# Patient Record
Sex: Male | Born: 1955
Health system: Southern US, Community
[De-identification: ages and names within clinical notes are randomized; demographics above are authoritative.]

## PROBLEM LIST (undated history)

## (undated) DIAGNOSIS — E119 Type 2 diabetes mellitus without complications: Secondary | ICD-10-CM

## (undated) DIAGNOSIS — K219 Gastro-esophageal reflux disease without esophagitis: Secondary | ICD-10-CM

## (undated) DIAGNOSIS — T4145XA Adverse effect of unspecified anesthetic, initial encounter: Secondary | ICD-10-CM

## (undated) DIAGNOSIS — M5126 Other intervertebral disc displacement, lumbar region: Secondary | ICD-10-CM

## (undated) DIAGNOSIS — I10 Essential (primary) hypertension: Secondary | ICD-10-CM

## (undated) DIAGNOSIS — Z87442 Personal history of urinary calculi: Secondary | ICD-10-CM

## (undated) DIAGNOSIS — R351 Nocturia: Secondary | ICD-10-CM

## (undated) DIAGNOSIS — T8859XA Other complications of anesthesia, initial encounter: Secondary | ICD-10-CM

## (undated) DIAGNOSIS — C801 Malignant (primary) neoplasm, unspecified: Secondary | ICD-10-CM

## (undated) DIAGNOSIS — N2889 Other specified disorders of kidney and ureter: Secondary | ICD-10-CM

## (undated) DIAGNOSIS — J310 Chronic rhinitis: Secondary | ICD-10-CM

## (undated) DIAGNOSIS — M1990 Unspecified osteoarthritis, unspecified site: Secondary | ICD-10-CM

## (undated) DIAGNOSIS — I8390 Asymptomatic varicose veins of unspecified lower extremity: Secondary | ICD-10-CM

## (undated) DIAGNOSIS — J302 Other seasonal allergic rhinitis: Secondary | ICD-10-CM

## (undated) DIAGNOSIS — K432 Incisional hernia without obstruction or gangrene: Secondary | ICD-10-CM

## (undated) DIAGNOSIS — Z87898 Personal history of other specified conditions: Secondary | ICD-10-CM

## (undated) DIAGNOSIS — K759 Inflammatory liver disease, unspecified: Secondary | ICD-10-CM

## (undated) DIAGNOSIS — Z8719 Personal history of other diseases of the digestive system: Secondary | ICD-10-CM

## (undated) HISTORY — PX: TONSILLECTOMY: SUR1361

## (undated) HISTORY — PX: VARICOSE VEIN SURGERY: SHX832

## (undated) HISTORY — PX: CYSTECTOMY: SUR359

---

## 2000-01-03 ENCOUNTER — Emergency Department (HOSPITAL_COMMUNITY): Admission: EM | Admit: 2000-01-03 | Discharge: 2000-01-03 | Payer: Self-pay | Admitting: Emergency Medicine

## 2000-01-03 ENCOUNTER — Encounter: Payer: Self-pay | Admitting: Emergency Medicine

## 2000-01-08 ENCOUNTER — Encounter: Payer: Self-pay | Admitting: Neurosurgery

## 2000-01-08 ENCOUNTER — Ambulatory Visit (HOSPITAL_COMMUNITY): Admission: RE | Admit: 2000-01-08 | Discharge: 2000-01-08 | Payer: Self-pay | Admitting: Neurosurgery

## 2004-08-09 ENCOUNTER — Ambulatory Visit (HOSPITAL_COMMUNITY): Admission: RE | Admit: 2004-08-09 | Discharge: 2004-08-09 | Payer: Self-pay | Admitting: *Deleted

## 2004-08-28 ENCOUNTER — Ambulatory Visit: Payer: Self-pay

## 2008-06-03 ENCOUNTER — Ambulatory Visit: Payer: Self-pay | Admitting: Gastroenterology

## 2008-06-16 ENCOUNTER — Telehealth: Payer: Self-pay | Admitting: Gastroenterology

## 2008-06-17 ENCOUNTER — Ambulatory Visit: Payer: Self-pay | Admitting: Gastroenterology

## 2010-11-01 ENCOUNTER — Other Ambulatory Visit: Payer: Self-pay | Admitting: Internal Medicine

## 2010-11-01 DIAGNOSIS — R1011 Right upper quadrant pain: Secondary | ICD-10-CM

## 2010-11-06 ENCOUNTER — Ambulatory Visit
Admission: RE | Admit: 2010-11-06 | Discharge: 2010-11-06 | Disposition: A | Discharge status: Home / Self Care | Payer: PRIVATE HEALTH INSURANCE | Source: Ambulatory Visit | Attending: Internal Medicine | Admitting: Internal Medicine

## 2010-11-06 DIAGNOSIS — R1011 Right upper quadrant pain: Secondary | ICD-10-CM

## 2011-03-18 HISTORY — PX: OTHER SURGICAL HISTORY: SHX169

## 2011-03-26 ENCOUNTER — Ambulatory Visit (HOSPITAL_BASED_OUTPATIENT_CLINIC_OR_DEPARTMENT_OTHER)
Admission: RE | Admit: 2011-03-26 | Discharge: 2011-03-26 | Disposition: A | Discharge status: Home / Self Care | Payer: PRIVATE HEALTH INSURANCE | Source: Ambulatory Visit | Attending: Specialist | Admitting: Specialist

## 2011-03-26 DIAGNOSIS — X58XXXA Exposure to other specified factors, initial encounter: Secondary | ICD-10-CM | POA: Insufficient documentation

## 2011-03-26 DIAGNOSIS — IMO0002 Reserved for concepts with insufficient information to code with codable children: Secondary | ICD-10-CM | POA: Insufficient documentation

## 2011-03-26 DIAGNOSIS — M2240 Chondromalacia patellae, unspecified knee: Secondary | ICD-10-CM | POA: Insufficient documentation

## 2011-03-26 DIAGNOSIS — S83289A Other tear of lateral meniscus, current injury, unspecified knee, initial encounter: Secondary | ICD-10-CM | POA: Insufficient documentation

## 2011-03-26 DIAGNOSIS — Z01810 Encounter for preprocedural cardiovascular examination: Secondary | ICD-10-CM | POA: Insufficient documentation

## 2011-03-26 DIAGNOSIS — Z01812 Encounter for preprocedural laboratory examination: Secondary | ICD-10-CM | POA: Insufficient documentation

## 2011-03-26 HISTORY — PX: KNEE ARTHROSCOPY W/ MENISCAL REPAIR: SHX1877

## 2011-03-26 LAB — POCT HEMOGLOBIN-HEMACUE: Hemoglobin: 16.1 g/dL (ref 13.0–17.0)

## 2011-04-01 NOTE — Op Note (Signed)
  [name], [name]               ACCOUNT NO.:  [id number]  MEDICAL RECORD NO.:  [id number]  LOCATION:                                 FACILITY:  PHYSICIAN:  Jene Every, M.D.         DATE OF BIRTH:  DATE OF PROCEDURE:  03/26/2011 DATE OF DISCHARGE:                              OPERATIVE REPORT   ASSISTANT:  None.  PREOPERATIVE DIAGNOSIS:  Medial meniscus tear of the left knee.  POSTOPERATIVE DIAGNOSES:  Medial meniscus tear of the left knee, lateral meniscus tear, grade 3 chondromalacia of medial femoral condyle.  PROCEDURE PERFORMED:  Left knee arthroscopy, partial medial and lateral meniscectomy.  Chondroplasty of patella and medial femoral condyle.  HISTORY:  55 year old with knee pain, locking, giving way.  MRI indicating meniscus tear, indicated for arthroscopic debridement, failing conservative treatment.  Risks and benefits discussed including bleeding, infection, no change in symptoms, worsening symptoms, need for repeat debridement, DVT, PE, anesthetic complications, etc.  TECHNIQUE:  The patient in supine position.  After induction of adequate general anesthesia, 2 g of Kefzol, left lower extremity was prepped and draped in usual sterile fashion.  A lateral parapatellar portal and superomedial parapatellar portal was fashioned with a #11 blade. Ingress cannula atraumatically placed.  Irrigant was utilized to insufflate the joint.  Under direct visualization, medial parapatellar portal was fashioned with a #11 blade after localization with 18 gauge needle sparing the medial meniscus.  Noted a posterior horn medial meniscal tear, grade 3 changes of femoral condyle, loose cartilaginous debris.  Collene Mares was introduced, utilized to perform a light chondroplasty of femoral condyle to a stable base.  Basket rongeur introduced and utilized to perform a partial medial meniscectomy of the posterior third of the meniscus, approximately one-third of the posterior horn was  removed.  The remnant was stable to probe palpation.  Intercondylar notch revealed ACL unremarkable.  Lateral compartment revealed tearing of lateral meniscus and grade 3 changes of tibial plateau, posterior horn, introduced a shaver and debrided it to a stable base, performed a light chondroplasty of the tibial plateau.  The remnant was stable to probe palpation.  Suprapatellar pouch revealed normal patellofemoral tracking and mild-to- moderate chondromalacia of patellofemoral joint.  Light chondroplasty performed here.  Gutters unremarkable.  Knee was copiously lavaged.  Re- examined all 3 compartments.  No further pathology amenable to arthroscopic intervention, therefore removed all instrumentation. Portals were closed with 4-0 nylon simple sutures.  0.25% Marcaine with epinephrine was infiltrated in the joint.  Wound was dressed sterilely, awoken without difficulty, and transported to the recovery room in satisfactory condition.  The patient tolerated the procedure well.  There were no complications.     Jene Every, M.D.     Cordelia Pen  D:  03/26/2011  T:  03/27/2011  Job:  [id number]  Electronically Signed by Jene Every M.D. on 04/01/2011 09:53:39 AM

## 2011-04-10 ENCOUNTER — Ambulatory Visit: Payer: PRIVATE HEALTH INSURANCE | Attending: Specialist

## 2011-04-10 DIAGNOSIS — M25569 Pain in unspecified knee: Secondary | ICD-10-CM | POA: Insufficient documentation

## 2011-04-10 DIAGNOSIS — IMO0001 Reserved for inherently not codable concepts without codable children: Secondary | ICD-10-CM | POA: Insufficient documentation

## 2011-04-10 DIAGNOSIS — M25669 Stiffness of unspecified knee, not elsewhere classified: Secondary | ICD-10-CM | POA: Insufficient documentation

## 2011-04-13 ENCOUNTER — Ambulatory Visit: Payer: PRIVATE HEALTH INSURANCE | Admitting: Physical Therapy

## 2011-04-17 ENCOUNTER — Ambulatory Visit: Payer: PRIVATE HEALTH INSURANCE

## 2011-04-20 ENCOUNTER — Ambulatory Visit: Payer: PRIVATE HEALTH INSURANCE | Attending: Specialist | Admitting: Physical Therapy

## 2011-04-20 DIAGNOSIS — M25669 Stiffness of unspecified knee, not elsewhere classified: Secondary | ICD-10-CM | POA: Insufficient documentation

## 2011-04-20 DIAGNOSIS — IMO0001 Reserved for inherently not codable concepts without codable children: Secondary | ICD-10-CM | POA: Insufficient documentation

## 2011-04-20 DIAGNOSIS — M25569 Pain in unspecified knee: Secondary | ICD-10-CM | POA: Insufficient documentation

## 2011-04-24 ENCOUNTER — Ambulatory Visit: Payer: PRIVATE HEALTH INSURANCE | Admitting: Physical Therapy

## 2011-04-27 ENCOUNTER — Ambulatory Visit: Payer: PRIVATE HEALTH INSURANCE | Admitting: Physical Therapy

## 2011-05-01 ENCOUNTER — Ambulatory Visit: Payer: PRIVATE HEALTH INSURANCE | Admitting: Physical Therapy

## 2011-05-04 ENCOUNTER — Ambulatory Visit: Payer: PRIVATE HEALTH INSURANCE

## 2011-05-08 ENCOUNTER — Ambulatory Visit: Payer: PRIVATE HEALTH INSURANCE | Admitting: Physical Therapy

## 2011-05-11 ENCOUNTER — Ambulatory Visit: Payer: PRIVATE HEALTH INSURANCE | Admitting: Physical Therapy

## 2011-05-15 ENCOUNTER — Ambulatory Visit: Payer: PRIVATE HEALTH INSURANCE | Admitting: Physical Therapy

## 2011-05-17 ENCOUNTER — Ambulatory Visit: Payer: PRIVATE HEALTH INSURANCE | Admitting: Physical Therapy

## 2011-10-19 HISTORY — PX: CERVICAL SPINE SURGERY: SHX589

## 2011-10-29 ENCOUNTER — Other Ambulatory Visit: Payer: Self-pay | Admitting: Otolaryngology

## 2012-02-04 ENCOUNTER — Encounter: Payer: Self-pay | Admitting: Physician Assistant

## 2012-02-04 ENCOUNTER — Ambulatory Visit (INDEPENDENT_AMBULATORY_CARE_PROVIDER_SITE_OTHER): Payer: PRIVATE HEALTH INSURANCE | Admitting: Physician Assistant

## 2012-02-04 VITALS — BP 130/85 | HR 80 | Temp 97.1°F | Resp 16 | Ht 70.5 in | Wt 247.2 lb

## 2012-02-04 DIAGNOSIS — I10 Essential (primary) hypertension: Secondary | ICD-10-CM

## 2012-02-04 LAB — BASIC METABOLIC PANEL
BUN: 12 mg/dL (ref 6–23)
CO2: 28 mEq/L (ref 19–32)
Chloride: 105 mEq/L (ref 96–112)
Glucose, Bld: 91 mg/dL (ref 70–99)
Potassium: 4.3 mEq/L (ref 3.5–5.3)
Sodium: 140 mEq/L (ref 135–145)

## 2012-02-04 MED ORDER — CHLORTHALIDONE 15 MG PO TABS: 15.0000 mg | ORAL_TABLET | Freq: Every day | ORAL | Status: DC

## 2012-02-04 NOTE — Progress Notes (Signed)
Patient ID: Michael Church MRN: [id number], DOB: 12-08-1955, 56 y.o. Date of Encounter: 02/04/2012, 2:31 PM  Primary Physician: No primary provider on file.  Chief Complaint: HTN  HPI: 56 y.o. year old male with history below presents for hypertension evaluation. Patient took his BP two weeks prior at a health fair in [place], [place]. It was elevated at that time, but patient is unsure what the reading was. Review of his paper chart reveals a history c/w prehypertension to hypertension readings. He has never been on an antihypertensive medication. Diet consists of mostly chicken and fish, with occasional pork and sauces. Occasional alcohol, but not daily. At most he will have a glass of wine or a shot, again not everyday. No tobacco use or illicit substances. He has recently began a workout routine at the gym, goal to lose about 20 pounds. He denies any family history of cardiac disease or hypertension. No CP, HA, visual changes, or focal deficits.   Past Medical History  Diagnosis Date  . Prehypertension      Home Meds: Prior to Admission medications   Not on File    Allergies:  Allergies  Allergen Reactions  . Penicillins Itching    History   Social History  . Marital Status: Married    Spouse Name: N/A    Number of Children: N/A  . Years of Education: N/A   Occupational History  . Not on file.   Social History Main Topics  . Smoking status: Never Smoker   . Smokeless tobacco: Never Used  . Alcohol Use: 0.5 oz/week    1 drink(s) per week     OCCASIONALLY DRINK - BEER AND MIXED DRINKS  . Drug Use: No  . Sexually Active: Not on file   Other Topics Concern  . Not on file   Social History Narrative  . No narrative on file     Family History  Problem Relation Age of Onset  . Stroke Father     age 64    Review of Systems: Constitutional: negative for chills, fever, night sweats, weight changes, or fatigue  HEENT: negative for vision changes, hearing loss,  congestion, rhinorrhea, ST, epistaxis, or sinus pressure Cardiovascular: negative for chest pain, palpitations, or DOE Respiratory: negative for hemoptysis, wheezing, shortness of breath, or cough Abdominal: negative for abdominal pain, nausea, vomiting, diarrhea, or constipation Dermatological: negative for rash Neurologic: negative for headache, dizziness, or syncope All other systems reviewed and are otherwise negative with the exception to those above and in the HPI.   Physical Exam: Blood pressure 130/85, pulse 80, temperature 97.1 F (36.2 C), temperature source Oral, resp. rate 16, height 5' 10.5" (1.791 m), weight 247 lb 3.2 oz (112.129 kg)., Body mass index is 34.97 kg/(m^2). General: Well developed, well nourished, in no acute distress. Head: Normocephalic, atraumatic, eyes without discharge, sclera non-icteric, nares are without discharge. Bilateral auditory canals clear, TM's are without perforation, pearly grey and translucent with reflective cone of light bilaterally. Oral cavity moist, posterior pharynx without exudate, erythema, peritonsillar abscess, or post nasal drip.  Neck: Supple. No thyromegaly. Full ROM. No lymphadenopathy. No carotid bruits. Lungs: Clear bilaterally to auscultation without wheezes, rales, or rhonchi. Breathing is unlabored. Heart: RRR with S1 S2. No murmurs, rubs, or gallops appreciated.  Msk:  Strength and tone normal for age. Extremities/Skin: Warm and dry. No clubbing or cyanosis. No edema. No rashes or suspicious lesions. Distal pulses 2+ and equal bilaterally. Neuro: Alert and oriented X 3. Moves all extremities  spontaneously. Gait is normal. CNII-XII grossly in tact. DTR 2+, cerebellar function intact. Rhomberg normal. Psych:  Responds to questions appropriately with a normal affect.   Labs:  BMP pending  EKG: NSR, read by Dr. Perrin Maltese  ASSESSMENT AND PLAN:  56 y.o. year old male with borderline hypertension -Start Chlorthalidone 15mg  1 po  daily #30 RF 1 -Await lab -Follow up 1 month -Discussed with Dr. Perrin Maltese -Schedule CPE  Signed, Eula Listen, PA-C 02/04/2012 2:31 PM

## 2012-02-05 ENCOUNTER — Telehealth: Payer: Self-pay

## 2012-02-05 NOTE — Telephone Encounter (Signed)
Spoke with pharmacist and changed Rx to HCTZ 12.5mg  #30 with 1 refill per Michael Church. The pharmacist stated she didn't have the strength of med Michael Church prescribed yesterday. Med changed.

## 2012-03-17 ENCOUNTER — Encounter: Payer: Self-pay | Admitting: Physician Assistant

## 2012-03-17 ENCOUNTER — Ambulatory Visit (INDEPENDENT_AMBULATORY_CARE_PROVIDER_SITE_OTHER): Payer: PRIVATE HEALTH INSURANCE | Admitting: Physician Assistant

## 2012-03-17 VITALS — BP 124/81 | HR 80 | Temp 97.1°F | Resp 16 | Ht 70.5 in | Wt 244.0 lb

## 2012-03-17 DIAGNOSIS — I10 Essential (primary) hypertension: Secondary | ICD-10-CM

## 2012-03-17 MED ORDER — HYDROCHLOROTHIAZIDE 12.5 MG PO CAPS: 12.5000 mg | ORAL_CAPSULE | ORAL | Status: DC

## 2012-03-17 NOTE — Progress Notes (Signed)
Patient ID: Michael Church MRN: [id number], DOB: Sep 21, 1955, 56 y.o. Date of Encounter: 03/17/2012, 2:32 PM  Primary Physician: No primary provider on file.  Chief Complaint: HTN  HPI: 56 y.o. year old male with history below presents for hypertension follow up. Doing well. No issues or complaints. Tolerating HCTZ 12.5 mg. Eating healthier. Has been working out regularly. No CP, HA, visual changes, or focal deficits. Trying to lose some weight.   Past Medical History  Diagnosis Date  . Prehypertension      Home Meds: Prior to Admission medications   Medication Sig Start Date End Date Taking? Authorizing Provider  hydrochlorothiazide (HYDRODIURIL) 12.5 MG tablet Take 12.5 mg by mouth daily.   Yes Historical Provider, MD    Allergies:  Allergies  Allergen Reactions  . Other Other (See Comments)    Foods - cantalopes, tomatoes, and  raw carrots  . Penicillins Itching    History   Social History  . Marital Status: Married    Spouse Name: N/A    Number of Children: N/A  . Years of Education: N/A   Occupational History  . Not on file.   Social History Main Topics  . Smoking status: Current Some Day Smoker -- 1 years    Types: Cigars  . Smokeless tobacco: Never Used  . Alcohol Use: 0.5 oz/week    1 drink(s) per week     OCCASIONALLY DRINK - BEER AND MIXED DRINKS  . Drug Use: No  . Sexually Active: Not on file   Other Topics Concern  . Not on file   Social History Narrative  . No narrative on file     Family History  Problem Relation Age of Onset  . Stroke Father     age 42    Review of Systems: Constitutional: negative for chills, fever, night sweats, weight changes, or fatigue  HEENT: negative for vision changes, hearing loss, congestion, rhinorrhea, ST, epistaxis, or sinus pressure Cardiovascular: negative for chest pain, palpitations, or DOE Respiratory: negative for hemoptysis, wheezing, shortness of breath, or cough Abdominal: negative for abdominal  pain, nausea, vomiting, diarrhea, or constipation Dermatological: negative for rash Neurologic: negative for headache, dizziness, or syncope All other systems reviewed and are otherwise negative with the exception to those above and in the HPI.   Physical Exam: Blood pressure 124/81, pulse 80, temperature 97.1 F (36.2 C), temperature source Oral, resp. rate 16, height 5' 10.5" (1.791 m), weight 244 lb (110.678 kg)., Body mass index is 34.52 kg/(m^2). General: Well developed, well nourished, in no acute distress. Head: Normocephalic, atraumatic, eyes without discharge, sclera non-icteric, nares are without discharge. Bilateral auditory canals clear, TM's are without perforation, pearly grey and translucent with reflective cone of light bilaterally. Oral cavity moist, posterior pharynx without exudate, erythema, peritonsillar abscess, or post nasal drip.  Neck: Supple. No thyromegaly. Full ROM. No lymphadenopathy. No carotid bruits. Lungs: Clear bilaterally to auscultation without wheezes, rales, or rhonchi. Breathing is unlabored. Heart: RRR with S1 S2. No murmurs, rubs, or gallops appreciated.  Msk:  Strength and tone normal for age. Extremities/Skin: Warm and dry. No clubbing or cyanosis. No edema. No rashes or suspicious lesions. Distal pulses 2+ and equal bilaterally. Neuro: Alert and oriented X 3. Moves all extremities spontaneously. Gait is normal. CNII-XII grossly in tact. DTR 2+, cerebellar function intact. Rhomberg normal. Psych:  Responds to questions appropriately with a normal affect.   Labs:  BMP pending  ASSESSMENT AND PLAN:  56 y.o. year old male with  well controlled HTN. -Continue HCTZ 12.5 mg #90 1 po daily RF 1 -Work on weight loss, goal of 230 in 3 months -Stop smoking tobacco -Continue regular exercise -Follow up 6 months  Signed, Eula Listen, PA-C 03/17/2012 2:32 PM

## 2012-03-18 LAB — BASIC METABOLIC PANEL
BUN: 13 mg/dL (ref 6–23)
CO2: 27 mEq/L (ref 19–32)
Calcium: 9.7 mg/dL (ref 8.4–10.5)
Glucose, Bld: 124 mg/dL — ABNORMAL HIGH (ref 70–99)

## 2012-04-05 ENCOUNTER — Ambulatory Visit (INDEPENDENT_AMBULATORY_CARE_PROVIDER_SITE_OTHER): Payer: PRIVATE HEALTH INSURANCE | Admitting: Family Medicine

## 2012-04-05 VITALS — BP 124/82 | HR 67 | Temp 98.3°F | Resp 18 | Ht 72.0 in | Wt 250.6 lb

## 2012-04-05 DIAGNOSIS — J029 Acute pharyngitis, unspecified: Secondary | ICD-10-CM

## 2012-04-05 LAB — POCT RAPID STREP A (OFFICE): Rapid Strep A Screen: NEGATIVE

## 2012-04-05 MED ORDER — PREDNISONE 20 MG PO TABS: ORAL_TABLET | ORAL | Status: DC

## 2012-04-05 NOTE — Patient Instructions (Addendum)
Drink plenty of fluids.  Avoid straining voice.   Pharyngitis, Viral and Bacterial Pharyngitis is soreness (inflammation) or infection of the pharynx. It is also called a sore throat. CAUSES  Most sore throats are caused by viruses and are part of a cold. However, some sore throats are caused by strep and other bacteria. Sore throats can also be caused by post nasal drip from draining sinuses, allergies and sometimes from sleeping with an open mouth. Infectious sore throats can be spread from person to person by coughing, sneezing and sharing cups or eating utensils. TREATMENT  Sore throats that are viral usually last 3-4 days. Viral illness will get better without medications (antibiotics). Strep throat and other bacterial infections will usually begin to get better about 24-48 hours after you begin to take antibiotics. HOME CARE INSTRUCTIONS   If the caregiver feels there is a bacterial infection or if there is a positive strep test, they will prescribe an antibiotic. The full course of antibiotics must be taken. If the full course of antibiotic is not taken, you or your child may become ill again. If you or your child has strep throat and do not finish all of the medication, serious heart or kidney diseases may develop.   Drink enough water and fluids to keep your urine clear or pale yellow.   Only take over-the-counter or prescription medicines for pain, discomfort or fever as directed by your caregiver.   Get lots of rest.   Gargle with salt water ( tsp. of salt in a glass of water) as often as every 1-2 hours as you need for comfort.   Hard candies may soothe the throat if individual is not at risk for choking. Throat sprays or lozenges may also be used.  SEEK MEDICAL CARE IF:   Large, tender lumps in the neck develop.   A rash develops.   Green, yellow-brown or bloody sputum is coughed up.   Your baby is older than 3 months with a rectal temperature of 100.5 F (38.1 C) or  higher for more than 1 day.  SEEK IMMEDIATE MEDICAL CARE IF:   A stiff neck develops.   You or your child are drooling or unable to swallow liquids.   You or your child are vomiting, unable to keep medications or liquids down.   You or your child has severe pain, unrelieved with recommended medications.   You or your child are having difficulty breathing (not due to stuffy nose).   You or your child are unable to fully open your mouth.   You or your child develop redness, swelling, or severe pain anywhere on the neck.   You have a fever.   Your baby is older than 3 months with a rectal temperature of 102 F (38.9 C) or higher.   Your baby is 28 months old or younger with a rectal temperature of 100.4 F (38 C) or higher.  MAKE SURE YOU:   Understand these instructions.   Will watch your condition.   Will get help right away if you are not doing well or get worse.  Document Released: 09/03/2005 Document Revised: 08/23/2011 Document Reviewed: 12/01/2007 [facility] Patient Information 2012 [place], [place].

## 2012-04-05 NOTE — Progress Notes (Signed)
Subjective: Patient has had about 3 days of sore throat. He awoke one morning with it this way. His voice is raspy. He has to use his voice at work. He does not smoke. He's not had a fever. No significant runny nose or cough. Generally he is pretty healthy. Recently started on blood pressure medicine.  Objective: TMs normal. Throat only minimally erythematous strep screen was taken neck supple without significant nodes. His voice is raspy. Heart regular without murmurs. Lungs clear.  Assessment:  Pharyngitis, probably viral  Plan:  Strep screen  Results for orders placed in visit on 04/05/12  POCT RAPID STREP A (OFFICE)      Component Value Range   Rapid Strep A Screen Negative  Negative   Prob viral, poss allergic pharyngitis  Will rx prednisone 60 mg qd for 3 days.

## 2012-09-22 ENCOUNTER — Other Ambulatory Visit: Payer: Self-pay | Admitting: *Deleted

## 2012-09-22 ENCOUNTER — Ambulatory Visit (INDEPENDENT_AMBULATORY_CARE_PROVIDER_SITE_OTHER): Payer: PRIVATE HEALTH INSURANCE | Admitting: Emergency Medicine

## 2012-09-22 ENCOUNTER — Encounter: Payer: Self-pay | Admitting: Physician Assistant

## 2012-09-22 VITALS — BP 136/80 | HR 87 | Temp 99.2°F | Resp 16 | Ht 71.0 in | Wt 246.8 lb

## 2012-09-22 DIAGNOSIS — R739 Hyperglycemia, unspecified: Secondary | ICD-10-CM

## 2012-09-22 DIAGNOSIS — I10 Essential (primary) hypertension: Secondary | ICD-10-CM

## 2012-09-22 DIAGNOSIS — E119 Type 2 diabetes mellitus without complications: Secondary | ICD-10-CM

## 2012-09-22 DIAGNOSIS — I8390 Asymptomatic varicose veins of unspecified lower extremity: Secondary | ICD-10-CM

## 2012-09-22 DIAGNOSIS — R7309 Other abnormal glucose: Secondary | ICD-10-CM

## 2012-09-22 LAB — BASIC METABOLIC PANEL
BUN: 16 mg/dL (ref 6–23)
Creat: 1.24 mg/dL (ref 0.50–1.35)
Glucose, Bld: 96 mg/dL (ref 70–99)
Potassium: 4.3 mEq/L (ref 3.5–5.3)

## 2012-09-22 MED ORDER — HYDROCHLOROTHIAZIDE 12.5 MG PO CAPS: 12.5000 mg | ORAL_CAPSULE | ORAL | Status: DC

## 2012-09-22 NOTE — Progress Notes (Signed)
  Subjective:    Patient ID: Michael Church, male    DOB: April 07, 1956, 57 y.o.   MRN: [id number]  HPI patient enters for followup of his high blood pressure. He denies chest pain or shortness of breath. He overall is feeling well. He is having trouble with his left knee and undergoes injections in his knee. He also has a varicose vein on the medial portion of the left knee. On his last visit here his sugar was borderline elevated    Review of Systems     Objective:   Physical Exam HEENT exam is unremarkable. His neck is supple. His chest is clear to auscultation and percussion. His cardiac exam is regular rate without murmurs rubs or gallops.    Results for orders placed in visit on 09/22/12  POCT GLYCOSYLATED HEMOGLOBIN (HGB A1C)      Component Value Range   Hemoglobin A1C 6.4        Assessment & Plan:  I repeated his blood pressure and it was 120/76. I told him he was now diabetic and needed to start being serious about weight loss and exercise. He is going to watch his free sugar intake. He's going to return to clinic in 4 months fasting. His blood pressure medicine was refilled and a  Bmet was checked. Also made a referral to the vein specialist for evaluation of varicose vein of his left knee

## 2012-09-23 ENCOUNTER — Emergency Department (HOSPITAL_COMMUNITY)
Admission: EM | Admit: 2012-09-23 | Discharge: 2012-09-23 | Disposition: A | Discharge status: Home / Self Care | Payer: PRIVATE HEALTH INSURANCE | Attending: Emergency Medicine | Admitting: Emergency Medicine

## 2012-09-23 ENCOUNTER — Encounter (HOSPITAL_COMMUNITY): Payer: Self-pay | Admitting: *Deleted

## 2012-09-23 ENCOUNTER — Emergency Department (HOSPITAL_COMMUNITY): Payer: PRIVATE HEALTH INSURANCE

## 2012-09-23 DIAGNOSIS — S0990XA Unspecified injury of head, initial encounter: Secondary | ICD-10-CM

## 2012-09-23 DIAGNOSIS — Z79899 Other long term (current) drug therapy: Secondary | ICD-10-CM | POA: Insufficient documentation

## 2012-09-23 DIAGNOSIS — I10 Essential (primary) hypertension: Secondary | ICD-10-CM | POA: Insufficient documentation

## 2012-09-23 DIAGNOSIS — R229 Localized swelling, mass and lump, unspecified: Secondary | ICD-10-CM | POA: Insufficient documentation

## 2012-09-23 DIAGNOSIS — S0083XA Contusion of other part of head, initial encounter: Secondary | ICD-10-CM

## 2012-09-23 DIAGNOSIS — H11039 Double pterygium of unspecified eye: Secondary | ICD-10-CM | POA: Insufficient documentation

## 2012-09-23 DIAGNOSIS — F17200 Nicotine dependence, unspecified, uncomplicated: Secondary | ICD-10-CM | POA: Insufficient documentation

## 2012-09-23 DIAGNOSIS — S0003XA Contusion of scalp, initial encounter: Secondary | ICD-10-CM | POA: Insufficient documentation

## 2012-09-23 HISTORY — DX: Essential (primary) hypertension: I10

## 2012-09-23 LAB — BASIC METABOLIC PANEL
CO2: 28 mEq/L (ref 19–32)
Calcium: 10.4 mg/dL (ref 8.4–10.5)
Creatinine, Ser: 1.06 mg/dL (ref 0.50–1.35)
GFR calc Af Amer: >90 mL/min (ref >90)

## 2012-09-23 LAB — CBC WITH DIFFERENTIAL/PLATELET
Basophils Absolute: 0 10*3/uL (ref 0.0–0.1)
Basophils Relative: 0 % (ref 0–1)
Eosinophils Relative: 0 % (ref 0–5)
HCT: 48.3 % (ref 39.0–52.0)
MCHC: 35.4 g/dL (ref 30.0–36.0)
MCV: 82.7 fL (ref 78.0–100.0)
Monocytes Absolute: 0.3 10*3/uL (ref 0.1–1.0)
RDW: 12.5 % (ref 11.5–15.5)

## 2012-09-23 MED ORDER — IBUPROFEN 200 MG PO TABS
600.0000 mg | ORAL_TABLET | Freq: Once | ORAL | Status: AC
  Administered 2012-09-23: 600 mg via ORAL
  Filled 2012-09-23: qty 3

## 2012-09-23 MED ORDER — LORAZEPAM 1 MG PO TABS
1.0000 mg | ORAL_TABLET | Freq: Once | ORAL | Status: AC
  Administered 2012-09-23: 1 mg via ORAL
  Filled 2012-09-23: qty 1

## 2012-09-23 MED ORDER — OXYCODONE-ACETAMINOPHEN 5-325 MG PO TABS
2.0000 | ORAL_TABLET | Freq: Once | ORAL | Status: AC
  Administered 2012-09-23: 2 via ORAL
  Filled 2012-09-23: qty 2
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
## 2012-09-23 NOTE — ED Provider Notes (Signed)
Medical screening examination/treatment/procedure(s) were performed by non-physician practitioner and as supervising physician I was immediately available for consultation/collaboration. Devoria Albe, MD, Armando Gang   Ward Givens, MD 09/23/12 [phone]
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
## 2012-09-23 NOTE — ED Notes (Signed)
CT called- should be initiated at approx 1600. Pt advised. Pt still reports some "hazy vision"

## 2012-09-25 ENCOUNTER — Telehealth: Payer: Self-pay

## 2012-09-25 NOTE — Telephone Encounter (Signed)
PATIENT STATES HE SAW DR. DAUB ON Monday FOR HIS HIGH BLOOD PRESSURE. HE REFILLED HIS BLOOD PRESSURE MEDICATION, HOWEVER, HE DID NOT FILL HIS PREDNISONE. HE NEEDS THAT CALLED INTO HIS PHARMACY PLEASE. BEST PHONE [phone] (CELL)   PHARMACY CHOICE IS [facility] ON [address].   MBC

## 2012-09-25 NOTE — Telephone Encounter (Signed)
His blood sugars have been elevated, what is he taking the prednisone for? Left message for him to call me back. He should not take the prednisone with his sugars elevated.

## 2012-09-28 NOTE — Telephone Encounter (Signed)
The prednisone was on pt avs and was wondering were it was.  Advised pt that this med was an old med back from July 2013 and the only med that was rx'd was the hctz.  Pt was a little confused.

## 2012-09-28 NOTE — Telephone Encounter (Signed)
lmom to cb. 

## 2012-11-11 ENCOUNTER — Ambulatory Visit (INDEPENDENT_AMBULATORY_CARE_PROVIDER_SITE_OTHER): Payer: PRIVATE HEALTH INSURANCE | Admitting: Family Medicine

## 2012-11-11 ENCOUNTER — Ambulatory Visit: Payer: PRIVATE HEALTH INSURANCE

## 2012-11-11 VITALS — BP 127/77 | HR 73 | Temp 97.9°F | Resp 18 | Ht 71.0 in | Wt 243.0 lb

## 2012-11-11 DIAGNOSIS — R110 Nausea: Secondary | ICD-10-CM

## 2012-11-11 DIAGNOSIS — R109 Unspecified abdominal pain: Secondary | ICD-10-CM

## 2012-11-11 DIAGNOSIS — E119 Type 2 diabetes mellitus without complications: Secondary | ICD-10-CM

## 2012-11-11 DIAGNOSIS — R197 Diarrhea, unspecified: Secondary | ICD-10-CM

## 2012-11-11 DIAGNOSIS — I10 Essential (primary) hypertension: Secondary | ICD-10-CM

## 2012-11-11 LAB — POCT CBC
Hemoglobin: 17.2 g/dL (ref 14.1–18.1)
MCH, POC: 29 pg (ref 27–31.2)
MPV: 8.4 fL (ref 0–99.8)
POC MID %: 6.9 %M (ref 0–12)
RBC: 5.93 M/uL (ref 4.69–6.13)
WBC: 5.5 10*3/uL (ref 4.6–10.2)

## 2012-11-11 LAB — COMPREHENSIVE METABOLIC PANEL
ALT: 30 U/L (ref 0–53)
Albumin: 4.4 g/dL (ref 3.5–5.2)
CO2: 32 mEq/L (ref 19–32)
Calcium: 10.3 mg/dL (ref 8.4–10.5)
Chloride: 98 mEq/L (ref 96–112)
Creat: 1.1 mg/dL (ref 0.50–1.35)
Potassium: 4.2 mEq/L (ref 3.5–5.3)
Total Protein: 7.1 g/dL (ref 6.0–8.3)

## 2012-11-11 LAB — LIPASE: Lipase: 18 U/L (ref 0–75)

## 2012-11-11 NOTE — Progress Notes (Signed)
Subjective:    Patient ID: Michael Church, male    DOB: [birth year], 57 y.o.   MRN: [id number]  HPI  On Friday he felt bloated with a little epigastric pian radiating down and then on Sat he developed diarrhea and nausea. On Sun, he felt like he was improving a little but still bloating - worse with laying flat, had to sleep sitting up. Yesterday cont to improve but wasn't eating much but last night a little nausea and diarrhea - couldn't eat anyting but soup and still had pressure over top of abdomen.  Diarrhea is loose, small amount, dark. Only vomited once and that was just the nacho chips and salsa he ate after work at 2 a.m. on Sat.  Has been trying some garcia cambia (from Dr. Neil Crouch - helps curb your appetite to loose weight) started it about 2 wks ago.  Grandson also had stomach virus this wkend.  Some chills but no fever.  Able to drink and urinating normally.  Pain is worse at night before bed.   Past Medical History  Diagnosis Date  . Prehypertension   . Hypertension    Current Outpatient Prescriptions on File Prior to Visit  Medication Sig Dispense Refill  . hydrochlorothiazide (MICROZIDE) 12.5 MG capsule Take 1 capsule (12.5 mg total) by mouth every morning.  90 capsule  3  . Multiple Vitamin (MULTIVITAMIN WITH MINERALS) TABS Take 1 tablet by mouth daily.       No current facility-administered medications on file prior to visit.   Allergies  Allergen Reactions  . Other Other (See Comments)    Foods - cantalopes, tomatoes, and  raw carrots  . Penicillins Itching     Review of Systems  Constitutional: Positive for chills, appetite change and fatigue. Negative for fever, diaphoresis and activity change.  Respiratory: Negative for shortness of breath.   Cardiovascular: Negative for chest pain.  Gastrointestinal: Positive for nausea, vomiting, abdominal pain, diarrhea and abdominal distention. Negative for constipation, blood in stool, anal bleeding and rectal pain.   Genitourinary: Negative for dysuria, frequency and decreased urine volume.  Hematological: Negative for adenopathy.      BP 127/77  Pulse 73  Temp(Src) 97.9 F (36.6 C) (Oral)  Resp 18  Ht 5\' 11"  (1.803 m)  Wt 243 lb (110.224 kg)  BMI 33.91 kg/m2  SpO2 98% Objective:   Physical Exam  Constitutional: He is oriented to person, place, and time. He appears well-developed and well-nourished. No distress.  HENT:  Head: Normocephalic and atraumatic.  Right Ear: External ear normal.  Left Ear: External ear normal.  Eyes: Conjunctivae are normal. Right eye exhibits no discharge. Left eye exhibits no discharge.  Neck: Normal range of motion. Neck supple. No thyromegaly present.  Cardiovascular: Normal rate, regular rhythm, normal heart sounds and intact distal pulses.   Pulmonary/Chest: Effort normal and breath sounds normal. No respiratory distress.  Abdominal: Soft. Normal appearance. He exhibits no distension and no mass. Bowel sounds are decreased. There is no hepatosplenomegaly. There is tenderness in the right upper quadrant and epigastric area. There is no rigidity, no rebound, no guarding, no CVA tenderness, no tenderness at McBurney's point and negative Murphy's sign. No hernia.  Genitourinary: Rectum normal and prostate normal. Rectal exam shows no tenderness and anal tone normal. Guaiac negative stool.  Lymphadenopathy:    He has no cervical adenopathy.  Neurological: He is alert and oriented to person, place, and time.  Skin: He is not diaphoretic.  Results for orders placed in visit on 11/11/12  POCT CBC      Result Value Range   WBC 5.5  4.6 - 10.2 K/uL   Lymph, poc 1.9  0.6 - 3.4   POC LYMPH PERCENT 34.1  10 - 50 %L   MID (cbc) 0.4  0 - 0.9   POC MID % 6.9  0 - 12 %M   POC Granulocyte 3.2  2 - 6.9   Granulocyte percent 59.0  37 - 80 %G   RBC 5.93  4.69 - 6.13 M/uL   Hemoglobin 17.2  14.1 - 18.1 g/dL   HCT, POC 57.8  46.9 - 53.7 %   MCV 89.2  80 - 97 fL   MCH,  POC 29.0  27 - 31.2 pg   MCHC 32.5  31.8 - 35.4 g/dL   RDW, POC 62.9     Platelet Count, POC 296  142 - 424 K/uL   MPV 8.4  0 - 99.8 fL   [facility] reading (PRIMARY) by  Dr. Clelia Croft. Acute abd series: NO acute abnormality. Still some stool present  Assessment & Plan:  Abdominal pain, unspecified site - Plan: POCT CBC, DG Abd Acute W/Chest, Comprehensive metabolic panel, Lipase  Hypertension  Diabetes - well controlled on diet and exercise - last a1c 6.4, not on meds.  Nausea alone - Plan: POCT CBC, DG Abd Acute W/Chest, Comprehensive metabolic panel, Lipase  Diarrhea - Plan: POCT CBC, DG Abd Acute W/Chest, Comprehensive metabolic panel, Lipase  I suspect pt has a mild viral irritation - sxs mild so no treatment needed that this time - do not use any anti-diarrheal products - rec mild soft bland diet and gradually increase diet in fiber - see handouts. Stop garcinia-[place] supplement until sxs completely resolve. If sxs worsen- rec RTC  Meds ordered this encounter  Medications   none

## 2012-11-11 NOTE — Patient Instructions (Addendum)
Diet for Diarrhea, Adult Having frequent, runny stools (diarrhea) has many causes. Diarrhea may be caused or worsened by food or drink. Diarrhea may be relieved by changing your diet. IF YOU ARE NOT TOLERATING SOLID FOODS:  Drink enough water and fluids to keep your urine clear or pale yellow.  Avoid sugary drinks and sodas as well as milk-based beverages.  Avoid beverages containing caffeine and alcohol.  You may try rehydrating beverages. You can make your own by following this recipe:   tsp table salt.   tsp baking soda.   tsp salt substitute (potassium chloride).  1 tbs + 1 tsp sugar.  1 qt water. As your stools become more solid, you can start eating solid foods. Add foods one at a time. If a certain food causes your diarrhea to get worse, avoid that food and try other foods. A low fiber, low-fat, and lactose-free diet is recommended. Small, frequent meals may be better tolerated.  Starches  Allowed:  White, French, and pita breads, plain rolls, buns, bagels. Plain muffins, matzo. Soda, saltine, or graham crackers. Pretzels, melba toast, zwieback. Cooked cereals made with water: cornmeal, farina, cream cereals. Dry cereals: refined corn, wheat, rice. Potatoes prepared any way without skins, refined macaroni, spaghetti, noodles, refined rice.  Avoid:  Bread, rolls, or crackers made with whole wheat, multi-grains, rye, bran seeds, nuts, or coconut. Corn tortillas or taco shells. Cereals containing whole grains, multi-grains, bran, coconut, nuts, or raisins. Cooked or dry oatmeal. Coarse wheat cereals, granola. Cereals advertised as "high-fiber." Potato skins. Whole grain pasta, wild or brown rice. Popcorn. Sweet potatoes/yams. Sweet rolls, doughnuts, waffles, pancakes, sweet breads. Vegetables  Allowed: Strained tomato and vegetable juices. Most well-cooked and canned vegetables without seeds. Fresh: Tender lettuce, cucumber without the skin, cabbage, spinach, bean  sprouts.  Avoid: Fresh, cooked, or canned: Artichokes, baked beans, beet greens, broccoli, Brussels sprouts, corn, kale, legumes, peas, sweet potatoes. Cooked: Green or red cabbage, spinach. Avoid large servings of any vegetables, because vegetables shrink when cooked, and they contain more fiber per serving than fresh vegetables. Fruit  Allowed: All fruit juices except prune juice. Cooked or canned: Apricots, applesauce, cantaloupe, cherries, fruit cocktail, grapefruit, grapes, kiwi, mandarin oranges, peaches, pears, plums, watermelon. Fresh: Apples without skin, ripe banana, grapes, cantaloupe, cherries, grapefruit, peaches, oranges, plums. Keep servings limited to  cup or 1 piece.  Avoid: Fresh: Apple with skin, apricots, mango, pears, raspberries, strawberries. Prune juice, stewed or dried prunes. Dried fruits, raisins, dates. Large servings of all fresh fruits. Meat and Meat Substitutes  Allowed: Ground or well-cooked tender beef, ham, veal, lamb, pork, or poultry. Eggs, plain cheese. Fish, oysters, shrimp, lobster, other seafoods. Liver, organ meats.  Avoid: Tough, fibrous meats with gristle. Peanut butter, smooth or chunky. Cheese, nuts, seeds, legumes, dried peas, beans, lentils. Milk  Allowed: Yogurt, lactose-free milk, kefir, drinkable yogurt, buttermilk, soy milk.  Avoid: Milk, chocolate milk, beverages made with milk, such as milk shakes. Soups  Allowed: Bouillon, broth, or soups made from allowed foods. Any strained soup.  Avoid: Soups made from vegetables that are not allowed, cream or milk-based soups. Desserts and Sweets  Allowed: Sugar-free gelatin, sugar-free frozen ice pops made without sugar alcohol.  Avoid: Plain cakes and cookies, pie made with allowed fruit, pudding, custard, cream pie. Gelatin, fruit, ice, sherbet, frozen ice pops. Ice cream, ice milk without nuts. Plain hard candy, honey, jelly, molasses, syrup, sugar, chocolate syrup, gumdrops,  marshmallows. Fats and Oils  Allowed: Avoid any fats and oils.  Avoid:   Seeds, nuts, olives, avocados. Margarine, butter, cream, mayonnaise, salad oils, plain salad dressings made from allowed foods. Plain gravy, crisp bacon without rind. Beverages  Allowed: Water, decaffeinated teas, oral rehydration solutions, sugar-free beverages.  Avoid: Fruit juices, caffeinated beverages (coffee, tea, soda or pop), alcohol, sports drinks, or lemon-lime soda or pop. Condiments  Allowed: Ketchup, mustard, horseradish, vinegar, cream sauce, cheese sauce, cocoa powder. Spices in moderation: allspice, basil, bay leaves, celery powder or leaves, cinnamon, cumin powder, curry powder, ginger, mace, marjoram, onion or garlic powder, oregano, paprika, parsley flakes, ground pepper, rosemary, sage, savory, tarragon, thyme, turmeric.  Avoid: Coconut, honey. Weight Monitoring: Weigh yourself every day. You should weigh yourself in the morning after you urinate and before you eat breakfast. Wear the same amount of clothing when you weigh yourself. Record your weight daily. Bring your recorded weights to your clinic visits. Tell your caregiver right away if you have gained 3 lb/1.4 kg or more in 1 day, 5 lb/2.3 kg in a week, or whatever amount you were told to report. SEEK IMMEDIATE MEDICAL CARE IF:   You are unable to keep fluids down.  You start to throw up (vomit) or diarrhea keeps coming back (persistent).  Abdominal pain develops, increases, or can be felt in one place (localizes).  You have an oral temperature above 102 F (38.9 C), not controlled by medicine.  Diarrhea contains blood or mucus.  You develop excessive weakness, dizziness, fainting, or extreme thirst. MAKE SURE YOU:   Understand these instructions.  Will watch your condition.  Will get help right away if you are not doing well or get worse. Document Released: 11/24/2003 Document Revised: 11/26/2011 Document Reviewed:  01/18/2012 [employer] Patient Information 2013 [employer].  

## 2013-01-13 ENCOUNTER — Ambulatory Visit (INDEPENDENT_AMBULATORY_CARE_PROVIDER_SITE_OTHER): Payer: PRIVATE HEALTH INSURANCE | Admitting: Emergency Medicine

## 2013-01-13 ENCOUNTER — Ambulatory Visit: Payer: PRIVATE HEALTH INSURANCE

## 2013-01-13 VITALS — BP 120/77 | HR 82 | Temp 97.0°F | Resp 16 | Ht 71.0 in | Wt 238.0 lb

## 2013-01-13 DIAGNOSIS — E119 Type 2 diabetes mellitus without complications: Secondary | ICD-10-CM

## 2013-01-13 DIAGNOSIS — M79641 Pain in right hand: Secondary | ICD-10-CM

## 2013-01-13 DIAGNOSIS — M79609 Pain in unspecified limb: Secondary | ICD-10-CM
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
## 2013-01-26 ENCOUNTER — Encounter: Payer: PRIVATE HEALTH INSURANCE | Admitting: Physician Assistant

## 2013-07-14 ENCOUNTER — Encounter: Payer: Self-pay | Admitting: Emergency Medicine

## 2013-07-14 ENCOUNTER — Ambulatory Visit (INDEPENDENT_AMBULATORY_CARE_PROVIDER_SITE_OTHER): Payer: PRIVATE HEALTH INSURANCE | Admitting: Emergency Medicine

## 2013-07-14 VITALS — BP 132/86 | HR 76 | Temp 97.7°F | Resp 16 | Ht 71.0 in | Wt 234.0 lb

## 2013-07-14 DIAGNOSIS — Z125 Encounter for screening for malignant neoplasm of prostate: Secondary | ICD-10-CM

## 2013-07-14 DIAGNOSIS — Z23 Encounter for immunization: Secondary | ICD-10-CM

## 2013-07-14 DIAGNOSIS — E119 Type 2 diabetes mellitus without complications: Secondary | ICD-10-CM

## 2013-07-14 DIAGNOSIS — I10 Essential (primary) hypertension: Secondary | ICD-10-CM

## 2013-07-14 LAB — COMPREHENSIVE METABOLIC PANEL
Albumin: 4.2 g/dL (ref 3.5–5.2)
BUN: 13 mg/dL (ref 6–23)
CO2: 25 mEq/L (ref 19–32)
Calcium: 9.3 mg/dL (ref 8.4–10.5)
Chloride: 102 mEq/L (ref 96–112)
Glucose, Bld: 100 mg/dL — ABNORMAL HIGH (ref 70–99)
Potassium: 4 mEq/L (ref 3.5–5.3)
Total Protein: 6.8 g/dL (ref 6.0–8.3)

## 2013-07-14 LAB — PSA: PSA: 1.29 ng/mL (ref <=4.00)

## 2013-07-14 LAB — GLUCOSE, POCT (MANUAL RESULT ENTRY): POC Glucose: 111 mg/dl — AB (ref 70–99)

## 2013-07-14 NOTE — Progress Notes (Signed)
  Subjective:    Patient ID: Michael Church, male    DOB: July 11, 1956, 57 y.o.   MRN: [id number]  HPI patient here to followup hypertension and diabetes. He is seeing Dr. Guss Bunde is scheduled to have surgery on his legs. He feels well is taking his medication regular. He exercises on a regular basis. Pressures at home have been good   Review of Systems     Objective:   Physical Exam patient is alert and cooperative in no distress. His chest is clear heart regular rate no murmurs abdomen soft nontender rectal exam reveals a normal prostate no nodules felt  Results for orders placed in visit on 07/14/13  GLUCOSE, POCT (MANUAL RESULT ENTRY)      Result Value Range   POC Glucose 111 (*) 70 - 99 mg/dl  IFOBT (OCCULT BLOOD)      Result Value Range   IFOBT Negative    POCT GLYCOSYLATED HEMOGLOBIN (HGB A1C)      Result Value Range   Hemoglobin A1C 5.7         Assessment & Plan:  No change in current recommendations. He was given a flu shot. He is a nondiabetic range he will continue his blood pressure medication exercise. PSA was done and pending

## 2013-08-09 ENCOUNTER — Encounter (HOSPITAL_COMMUNITY): Payer: Self-pay | Admitting: Emergency Medicine

## 2013-08-09 ENCOUNTER — Ambulatory Visit: Payer: PRIVATE HEALTH INSURANCE

## 2013-08-09 ENCOUNTER — Ambulatory Visit (INDEPENDENT_AMBULATORY_CARE_PROVIDER_SITE_OTHER): Payer: PRIVATE HEALTH INSURANCE | Admitting: Family Medicine

## 2013-08-09 VITALS — BP 132/84 | HR 74 | Temp 98.1°F | Resp 18 | Ht 71.0 in | Wt 239.8 lb

## 2013-08-09 DIAGNOSIS — Z87891 Personal history of nicotine dependence: Secondary | ICD-10-CM | POA: Insufficient documentation

## 2013-08-09 DIAGNOSIS — R142 Eructation: Secondary | ICD-10-CM

## 2013-08-09 DIAGNOSIS — R079 Chest pain, unspecified: Secondary | ICD-10-CM

## 2013-08-09 DIAGNOSIS — I10 Essential (primary) hypertension: Secondary | ICD-10-CM | POA: Insufficient documentation

## 2013-08-09 DIAGNOSIS — R141 Gas pain: Secondary | ICD-10-CM

## 2013-08-09 DIAGNOSIS — Z880 Allergy status to penicillin: Secondary | ICD-10-CM | POA: Insufficient documentation

## 2013-08-09 DIAGNOSIS — Z8679 Personal history of other diseases of the circulatory system: Secondary | ICD-10-CM

## 2013-08-09 DIAGNOSIS — K219 Gastro-esophageal reflux disease without esophagitis: Secondary | ICD-10-CM | POA: Insufficient documentation

## 2013-08-09 DIAGNOSIS — Z79899 Other long term (current) drug therapy: Secondary | ICD-10-CM | POA: Insufficient documentation

## 2013-08-09 MED ORDER — RANITIDINE HCL 150 MG PO TABS: 150.0000 mg | ORAL_TABLET | Freq: Two times a day (BID) | ORAL | Status: DC

## 2013-08-09 NOTE — Progress Notes (Addendum)
Subjective:    Patient ID: Michael Church, male    DOB: 1956/07/05, 57 y.o.   MRN: [id number] This chart was scribed for Michael Staggers, MD by [name], ED Scribe. This patient was seen in room 4 and the patient's care was started at 4:23 PM.  HPI [name] is a 57 y.o. male He reports right leg vein surgery on 07/18/2013 and again on the 08/03/2013. [name] performed lasic surgery to varicose veins. He states they did [place] with negative results for blood clot on 08/03/2013. He states he wears compression stockings on his right leg. He reports mild soreness over bandages on leg from surgery on 07/18/2013, but denies pain since the first surgery.   He states 4 days ago he started walking and felt some sudden, sharp, intermittent, lower, central chest pain and tightness that radiated to his left chest, around his back and also up to his throat. He reports the pain again on Thursday and Friday and earlier today. He denies current chest pain. He states that the pain usually lasts about 1 hour and subsides gradually after that. He denies being able to reproduce the pain by touch. He denies the pain radiating to his arms. He reports that deep breathing exacerbates the chest pain. He reports some minor belching and burping after feeling the need to belch each time he felt the chest pain. He states he has been able to exercise without chest pain. He reports being able to eat and drink normally. He reports drinking about 2-3 caffeinated drinks a day. He denies having products with mint recently. He denies SOB, nausea, abdominal pain, cough, fever, and any other associated symptoms. He denies h/o heart problems and h/o blood clots, and family history of the same. He denies h/o smoking. He reports his father died of stroke.    Denies hx of DM2 - had elevated A1c at 6.5 about a year and a half ago, but lower since and most recent Aic 5.7 with weight loss and avoiding soda.   Results for orders  placed in visit on 07/14/13  COMPREHENSIVE METABOLIC PANEL      Result Value Range   Sodium 138  135 - 145 mEq/L   Potassium 4.0  3.5 - 5.3 mEq/L   Chloride 102  96 - 112 mEq/L   CO2 25  19 - 32 mEq/L   Glucose, Bld 100 (*) 70 - 99 mg/dL   BUN 13  6 - 23 mg/dL   Creat 4.09  8.11 - 9.14 mg/dL   Total Bilirubin 0.6  0.3 - 1.2 mg/dL   Alkaline Phosphatase 35 (*) 39 - 117 U/L   AST 26  0 - 37 U/L   ALT 26  0 - 53 U/L   Total Protein 6.8  6.0 - 8.3 g/dL   Albumin 4.2  3.5 - 5.2 g/dL   Calcium 9.3  8.4 - 78.2 mg/dL  PSA      Result Value Range   PSA 1.29  <=4.00 ng/mL  GLUCOSE, POCT (MANUAL RESULT ENTRY)      Result Value Range   POC Glucose 111 (*) 70 - 99 mg/dl  IFOBT (OCCULT BLOOD)      Result Value Range   IFOBT Negative    POCT GLYCOSYLATED HEMOGLOBIN (HGB A1C)      Result Value Range   Hemoglobin A1C 5.7       PCP - GUEST, Loretha Stapler, MD  Patient Active Problem List   Diagnosis  Date Noted  . Hypertension 09/22/2012  . Diabetes 09/22/2012  . Varicose vein of leg 09/22/2012   Past Medical History  Diagnosis Date  . Prehypertension   . Hypertension    Past Surgical History  Procedure Laterality Date  . Left knee torn meniscus  03/2011  . Cervical spine surgery  10/2011   Allergies  Allergen Reactions  . Other Other (See Comments)    Foods - cantalopes, tomatoes, and  raw carrots  . Penicillins Itching   Prior to Admission medications   Medication Sig Start Date End Date Taking? Authorizing Provider  hydrochlorothiazide (MICROZIDE) 12.5 MG capsule Take 1 capsule (12.5 mg total) by mouth every morning. 09/22/12 09/22/13 Yes Collene Gobble, MD  Multiple Vitamin (MULTIVITAMIN WITH MINERALS) TABS Take 1 tablet by mouth daily.   Yes Historical Provider, MD  PENNSAID 1.5 % SOLN  09/12/12  Yes Historical Provider, MD   Results for orders placed in visit on 07/14/13  COMPREHENSIVE METABOLIC PANEL      Result Value Range   Sodium 138  135 - 145 mEq/L   Potassium 4.0   3.5 - 5.3 mEq/L   Chloride 102  96 - 112 mEq/L   CO2 25  19 - 32 mEq/L   Glucose, Bld 100 (*) 70 - 99 mg/dL   BUN 13  6 - 23 mg/dL   Creat 1.61  0.96 - 0.45 mg/dL   Total Bilirubin 0.6  0.3 - 1.2 mg/dL   Alkaline Phosphatase 35 (*) 39 - 117 U/L   AST 26  0 - 37 U/L   ALT 26  0 - 53 U/L   Total Protein 6.8  6.0 - 8.3 g/dL   Albumin 4.2  3.5 - 5.2 g/dL   Calcium 9.3  8.4 - 40.9 mg/dL  PSA      Result Value Range   PSA 1.29  <=4.00 ng/mL  GLUCOSE, POCT (MANUAL RESULT ENTRY)      Result Value Range   POC Glucose 111 (*) 70 - 99 mg/dl  IFOBT (OCCULT BLOOD)      Result Value Range   IFOBT Negative    POCT GLYCOSYLATED HEMOGLOBIN (HGB A1C)      Result Value Range   Hemoglobin A1C 5.7      Review of Systems  Constitutional: Negative for fever.  Respiratory: Positive for chest tightness. Negative for cough and shortness of breath.   Cardiovascular: Positive for chest pain.  Gastrointestinal: Negative for nausea and abdominal pain.  Musculoskeletal: Positive for back pain.      Objective:   Physical Exam  Vitals reviewed. Constitutional: He is oriented to person, place, and time. He appears well-developed and well-nourished.  HENT:  Head: Normocephalic and atraumatic.  Eyes: EOM are normal. Pupils are equal, round, and reactive to light.  Neck: No JVD present. Carotid bruit is not present.  Cardiovascular: Normal rate, regular rhythm and normal heart sounds.   No murmur heard. Pulmonary/Chest: Effort normal and breath sounds normal. He has no rales.  Abdominal: Soft. There is no tenderness. There is negative Murphy's sign.  Musculoskeletal: Normal range of motion. He exhibits no edema.  Neurological: He is alert and oriented to person, place, and time.  Skin: Skin is warm and dry.  Psychiatric: He has a normal mood and affect. His behavior is normal.   Compression stockings in place on R lower leg. Repeated exam and questioned regarding calf pain, but ultimately denied  rececn calf pain, and denies ttp of R calf,  L calf nt.    BP 132/84  Pulse 74  Temp(Src) 98.1 F (36.7 C) (Oral)  Resp 18  Ht 5\' 11"  (1.803 m)  Wt 239 lb 12.8 oz (108.773 kg)  BMI 33.46 kg/m2  SpO2 99%  EKG: SR, nonspecific t wave in III and AVF, but appears unchanged since 02/04/12.   CXR: NAD  5:20 PM Additional history obtained - after vein surgery 4 days ago - taking ibuprofen 600mg  2-3 times per day for 1st few days, but none in past 2-3 days. Had pain when getting xray - improved with belch. No pain with exercise.      Assessment & Plan:   DESHAN HEMMELGARN is a 57 y.o. male Chest pain - Plan: DG Chest 2 View, EKG 12-Lead  Belching - Plan: DG Chest 2 View, EKG 12-Lead, ranitidine (ZANTAC) 150 MG tablet  Hx of varicose veins  Lower chest pain with radiation to back and throat, associated with belching, and not associated with dyspnea, diaphoresis, nausea. Denies exertional component. Improves with belching, recent higher dose NSAID, suspected gastrointestinal cause, and without acute pain at present in office. Did have recent vein surgery, but denies hx of DVT/PE, or hx of coagulopathy, no personal hx of CAD/heart disease or early FH of this.  Suspected esophageal spasm, vs GERD, vs achalasia.  Treatment and eval options discussed, including ER eval tonight for possible D dimer or cardiac enzymes at their discretion, but given sx's appearing to be gastrointestinal in nature, decision made with patient to try Zantac 150mg  BID  - 1st dose in office, avoid GERD triggers,  and if not improving into tomorrow - return or go to ER.  Plan to go to ER or call 911 overnight if any progression or worsening of sx's as below. Understanding expressed.    Meds ordered this encounter  Medications  . ranitidine (ZANTAC) 150 MG tablet    Sig: Take 1 tablet (150 mg total) by mouth 2 (two) times daily.    Dispense:  30 tablet    Refill:  0   Patient Instructions  Start Zantac 150mg  - twice per  day - 1st dose given tonight. Avoid foods known to cause heartburn as below, and NSAIDS like ibuprofen can also cause this. If any change in your pain, shortness of breath, associated nausea or dizziness, or new areas of radiation of pain-call 911/go to the emergency room for evaluation. If stable but not improving in next day or two - return to discuss further as may need to see gastroenterologist or have other studies done.   Diet for Gastroesophageal Reflux Disease, Adult Reflux (acid reflux) is when acid from your stomach flows up into the esophagus. When acid comes in contact with the esophagus, the acid causes irritation and soreness (inflammation) in the esophagus. When reflux happens often or so severely that it causes damage to the esophagus, it is called gastroesophageal reflux disease (GERD). Nutrition therapy can help ease the discomfort of GERD. FOODS OR DRINKS TO AVOID OR LIMIT  Smoking or chewing tobacco. Nicotine is one of the most potent stimulants to acid production in the gastrointestinal tract.  Caffeinated and decaffeinated coffee and black tea.  Regular or low-calorie carbonated beverages or energy drinks (caffeine-free carbonated beverages are allowed).   Strong spices, such as black pepper, white pepper, red pepper, cayenne, curry powder, and chili powder.  Peppermint or spearmint.  Chocolate.  High-fat foods, including meats and fried foods. Extra added fats including oils, butter, salad dressings, and  nuts. Limit these to less than 8 tsp per day.  Fruits and vegetables if they are not tolerated, such as citrus fruits or tomatoes.  Alcohol.  Any food that seems to aggravate your condition. If you have questions regarding your diet, call your caregiver or a registered dietitian. OTHER THINGS THAT MAY HELP GERD INCLUDE:   Eating your meals slowly, in a relaxed setting.  Eating 5 to 6 small meals per day instead of 3 large meals.  Eliminating food for a period  of time if it causes distress.  Not lying down until 3 hours after eating a meal.  Keeping the head of your bed raised 6 to 9 inches (15 to 23 cm) by using a foam wedge or blocks under the legs of the bed. Lying flat may make symptoms worse.  Being physically active. Weight loss may be helpful in reducing reflux in overweight or obese adults.  Wear loose fitting clothing EXAMPLE MEAL PLAN This meal plan is approximately 2,000 calories based on https://www.bernard.org/ meal planning guidelines. Breakfast   cup cooked oatmeal.  1 cup strawberries.  1 cup low-fat milk.  1 oz almonds. Snack  1 cup cucumber slices.  6 oz yogurt (made from low-fat or fat-free milk). Lunch  2 slice whole-wheat bread.  2 oz sliced [place].  2 tsp mayonnaise.  1 cup blueberries.  1 cup snap peas. Snack  6 whole-wheat crackers.  1 oz string cheese. Dinner   cup brown rice.  1 cup mixed veggies.  1 tsp olive oil.  3 oz grilled fish. Document Released: 09/03/2005 Document Revised: 11/26/2011 Document Reviewed: 07/20/2011 [facility] Patient Information 2014 [place], [place].

## 2013-08-09 NOTE — Patient Instructions (Signed)
Start Zantac 150mg  - twice per day - 1st dose given tonight. Avoid foods known to cause heartburn as below, and NSAIDS like ibuprofen can also cause this. If any change in your pain, shortness of breath, associated nausea or dizziness, or new areas of radiation of pain-call 911/go to the emergency room for evaluation. If stable but not improving in next day or two - return to discuss further as may need to see gastroenterologist or have other studies done.   Diet for Gastroesophageal Reflux Disease, Adult Reflux (acid reflux) is when acid from your stomach flows up into the esophagus. When acid comes in contact with the esophagus, the acid causes irritation and soreness (inflammation) in the esophagus. When reflux happens often or so severely that it causes damage to the esophagus, it is called gastroesophageal reflux disease (GERD). Nutrition therapy can help ease the discomfort of GERD. FOODS OR DRINKS TO AVOID OR LIMIT  Smoking or chewing tobacco. Nicotine is one of the most potent stimulants to acid production in the gastrointestinal tract.  Caffeinated and decaffeinated coffee and black tea.  Regular or low-calorie carbonated beverages or energy drinks (caffeine-free carbonated beverages are allowed).   Strong spices, such as black pepper, white pepper, red pepper, cayenne, curry powder, and chili powder.  Peppermint or spearmint.  Chocolate.  High-fat foods, including meats and fried foods. Extra added fats including oils, butter, salad dressings, and nuts. Limit these to less than 8 tsp per day.  Fruits and vegetables if they are not tolerated, such as citrus fruits or tomatoes.  Alcohol.  Any food that seems to aggravate your condition. If you have questions regarding your diet, call your caregiver or a registered dietitian. OTHER THINGS THAT MAY HELP GERD INCLUDE:   Eating your meals slowly, in a relaxed setting.  Eating 5 to 6 small meals per day instead of 3 large  meals.  Eliminating food for a period of time if it causes distress.  Not lying down until 3 hours after eating a meal.  Keeping the head of your bed raised 6 to 9 inches (15 to 23 cm) by using a foam wedge or blocks under the legs of the bed. Lying flat may make symptoms worse.  Being physically active. Weight loss may be helpful in reducing reflux in overweight or obese adults.  Wear loose fitting clothing EXAMPLE MEAL PLAN This meal plan is approximately 2,000 calories based on https://www.bernard.org/ meal planning guidelines. Breakfast   cup cooked oatmeal.  1 cup strawberries.  1 cup low-fat milk.  1 oz almonds. Snack  1 cup cucumber slices.  6 oz yogurt (made from low-fat or fat-free milk). Lunch  2 slice whole-wheat bread.  2 oz sliced [place].  2 tsp mayonnaise.  1 cup blueberries.  1 cup snap peas. Snack  6 whole-wheat crackers.  1 oz string cheese. Dinner   cup brown rice.  1 cup mixed veggies.  1 tsp olive oil.  3 oz grilled fish. Document Released: 09/03/2005 Document Revised: 11/26/2011 Document Reviewed: 07/20/2011 [facility] Patient Information 2014 [place], [place].

## 2013-08-09 NOTE — Progress Notes (Signed)
Zantac 150mg  given at 5:55pm in office.  Lot # [id number] Exp:06/2014 Cassie Freer.

## 2013-08-09 NOTE — ED Notes (Signed)
Patient with 4 day history of chest pain off and on.  Patient states that he has been having the pain continuously today.  Patient states that the pain is a pressure goes around his left chest, into back and radiates into neck and jaw.  Patient denies any shortness of breath, nausea or vomiting, no diaphoresis.

## 2013-08-10 ENCOUNTER — Emergency Department (HOSPITAL_COMMUNITY): Payer: PRIVATE HEALTH INSURANCE

## 2013-08-10 ENCOUNTER — Emergency Department (HOSPITAL_COMMUNITY)
Admission: EM | Admit: 2013-08-10 | Discharge: 2013-08-10 | Disposition: A | Discharge status: Home / Self Care | Payer: PRIVATE HEALTH INSURANCE | Attending: Emergency Medicine | Admitting: Emergency Medicine

## 2013-08-10 DIAGNOSIS — R079 Chest pain, unspecified: Secondary | ICD-10-CM

## 2013-08-10 DIAGNOSIS — K219 Gastro-esophageal reflux disease without esophagitis: Secondary | ICD-10-CM

## 2013-08-10 LAB — CBC
Platelets: 227 10*3/uL (ref 150–400)
RBC: 4.95 MIL/uL (ref 4.22–5.81)
RDW: 12.6 % (ref 11.5–15.5)
WBC: 4.9 10*3/uL (ref 4.0–10.5)

## 2013-08-10 LAB — POCT I-STAT TROPONIN I
Troponin i, poc: 0 ng/mL (ref 0.00–0.08)
Troponin i, poc: 0.01 ng/mL (ref 0.00–0.08)

## 2013-08-10 LAB — BASIC METABOLIC PANEL
CO2: 25 mEq/L (ref 19–32)
Calcium: 9.2 mg/dL (ref 8.4–10.5)
Chloride: 99 mEq/L (ref 96–112)
GFR calc Af Amer: >90 mL/min (ref >90)
Sodium: 135 mEq/L (ref 135–145)

## 2013-08-10 MED ORDER — OMEPRAZOLE 20 MG PO CPDR: 20.0000 mg | DELAYED_RELEASE_CAPSULE | Freq: Every day | ORAL | Status: DC

## 2013-08-10 MED ORDER — ACETAMINOPHEN 500 MG PO TABS
1000.0000 mg | ORAL_TABLET | Freq: Once | ORAL | Status: AC
  Administered 2013-08-10: 1000 mg via ORAL
  Filled 2013-08-10: qty 2

## 2013-08-10 MED ORDER — GI COCKTAIL ~~LOC~~
30.0000 mL | Freq: Once | ORAL | Status: AC
  Administered 2013-08-10: 30 mL via ORAL
  Filled 2013-08-10: qty 30

## 2013-08-10 MED ORDER — IOHEXOL 350 MG/ML SOLN
100.0000 mL | Freq: Once | INTRAVENOUS | Status: AC | PRN
  Administered 2013-08-10: 100 mL via INTRAVENOUS

## 2013-08-10 NOTE — ED Provider Notes (Signed)
0700 - Care from Dr. Norlene Campbell. 55M here with CP. Serial troponins normal. Dimer ordered, elevated, care to me while awaiting CT Angio chest.  CT Angio chest without evidence of PE. States some mild CHF cannot be excluded vs basilar pneumonitis. Re-eval of the patient has hx c/w reflux. LIkely the pneumonitis is due to reflux. No DOE, no physical exam signs c/w CHF. Given Rx for prilosec and instructed to f/u with his doctor.  1. Chest pain   2. Acid reflux      Dagmar Hait, MD 08/10/13 [phone]

## 2013-08-10 NOTE — ED Provider Notes (Signed)
CSN: [id number]     Arrival date & time 08/09/13  2343 History   First MD Initiated Contact with Patient 08/10/13 [phone]     Chief Complaint  Patient presents with  . Chest Pain   (Consider location/radiation/quality/duration/timing/severity/associated sxs/prior Treatment) HPI 57 year old male presents to emergency room with complaint of chest pain.  Patient has had intermittent chest pain for last 4 days, but constant chest pain since 1 PM yesterday.  He reports chest pain resolved at 4 AM just prior to my assessment.  Patient was seen yesterday at urgent care.  They started him on ranitidine.  Patient reports no pain with exertion.  Pain starts in his epigastrium spreads around to his back up into his chest and up into his jaw bilaterally.  Pain is sharp.  No prior h/o same.  Patient recently had laser vein surgery for varicose vein.  He has been taking ibuprofen up until Wednesday for minor pain.  He reports he has had negative ultrasounds of his leg for DVTs.  Patient has history of hypertension, for which he takes 12.5, HCTZ.  No family history of coronary disease. Past Medical History  Diagnosis Date  . Prehypertension   . Hypertension    Past Surgical History  Procedure Laterality Date  . Left knee torn meniscus  03/2011  . Cervical spine surgery  10/2011  . Varicose vein surgery     Family History  Problem Relation Age of Onset  . Stroke Father     age 33   History  Substance Use Topics  . Smoking status: Former Smoker -- 1 years    Types: Cigars  . Smokeless tobacco: Never Used  . Alcohol Use: 0.5 oz/week    1 drink(s) per week     Comment: OCCASIONALLY DRINK - BEER AND MIXED DRINKS    Review of Systems  See History of Present Illness; otherwise all other systems are reviewed and negative Allergies  Other and Penicillins  Home Medications   Current Outpatient Rx  Name  Route  Sig  Dispense  Refill  . Diclofenac Sodium (PENNSAID) 1.5 % SOLN   Transdermal   Place  1-6 drops onto the skin daily as needed (for knee pain).         . hydrochlorothiazide (MICROZIDE) 12.5 MG capsule   Oral   Take 12.5 mg by mouth daily.         Marland Kitchen ibuprofen (ADVIL,MOTRIN) 200 MG tablet   Oral   Take 600 mg by mouth every 6 (six) hours as needed for moderate pain.         . Multiple Vitamin (MULTIVITAMIN WITH MINERALS) TABS   Oral   Take 1 tablet by mouth daily.         . ranitidine (ZANTAC) 150 MG tablet   Oral   Take 1 tablet (150 mg total) by mouth 2 (two) times daily.   30 tablet   0    BP 130/78  Pulse 62  Temp(Src) 98.1 F (36.7 C) (Oral)  Resp 14  Ht 5\' 11"  (1.803 m)  Wt 240 lb (108.863 kg)  BMI 33.49 kg/m2  SpO2 100% Physical Exam  Nursing note and vitals reviewed. Constitutional: He is oriented to person, place, and time. He appears well-developed and well-nourished.  HENT:  Head: Normocephalic and atraumatic.  Nose: Nose normal.  Mouth/Throat: Oropharynx is clear and moist.  Eyes: Conjunctivae and EOM are normal. Pupils are equal, round, and reactive to light.  Neck: Normal range of motion.  Neck supple. No JVD present. No tracheal deviation present. No thyromegaly present.  Cardiovascular: Normal rate, regular rhythm, normal heart sounds and intact distal pulses.  Exam reveals no gallop and no friction rub.   No murmur heard. Pulmonary/Chest: Effort normal and breath sounds normal. No stridor. No respiratory distress. He has no wheezes. He has no rales. He exhibits no tenderness.  Abdominal: Soft. Bowel sounds are normal. He exhibits no distension and no mass. There is no tenderness. There is no rebound and no guarding.  Musculoskeletal: Normal range of motion. He exhibits no edema and no tenderness.  Lymphadenopathy:    He has no cervical adenopathy.  Neurological: He is alert and oriented to person, place, and time. He exhibits normal muscle tone. Coordination normal.  Skin: Skin is warm and dry. No rash noted. No erythema. No pallor.   Psychiatric: He has a normal mood and affect. His behavior is normal. Judgment and thought content normal.    ED Course  Procedures (including critical care time) Labs Review Labs Reviewed  BASIC METABOLIC PANEL - Abnormal; Notable for the following:    Potassium 3.2 (*)    Glucose, Bld 124 (*)    All other components within normal limits  D-DIMER, QUANTITATIVE - Abnormal; Notable for the following:    D-Dimer, Quant 0.64 (*)    All other components within normal limits  CBC  POCT I-STAT TROPONIN I  POCT I-STAT TROPONIN I   Imaging Review Dg Chest 2 View  08/10/2013   CLINICAL DATA:  Mid chest pain radiating to the left. Recent varicose vein surgery November 17th.  EXAM: CHEST  2 VIEW  COMPARISON:  08/09/2013 [facility].  FINDINGS: The heart size and mediastinal contours are within normal limits. Both lungs are clear. The visualized skeletal structures are unremarkable.  IMPRESSION: No active cardiopulmonary disease.   Electronically Signed   By: Burman Nieves M.D.   On: 08/10/2013 00:32   Dg Chest 2 View  08/09/2013   CLINICAL DATA:  Chest pain, indigestion  EXAM: CHEST  2 VIEW  COMPARISON:  11/11/2012  FINDINGS: The heart size and vascular pattern are normal. There is no infiltrate or effusion. There is a 3 mm nodular opacity projecting over the left upper lobe. This may be present within the anterior left 3rd rib or the posterior left 6th rib. It appears to have been present previously also projecting over the 6th rib and may represent a tiny bone island.  IMPRESSION: No active cardiopulmonary disease.   Electronically Signed   By: Esperanza Heir M.D.   On: 08/09/2013 17:41    EKG Interpretation    Date/Time:  Sunday August 09 2013 23:51:11 EST Ventricular Rate:  69 PR Interval:  166 QRS Duration: 90 QT Interval:  376 QTC Calculation: 402 R Axis:   69 Text Interpretation:  Normal sinus rhythm Nonspecific T wave abnormality Abnormal ECG  Confirmed by Spiros Greenfeld  MD, Dois Juarbe ([id number]) on 08/10/2013 3:54:39 AM            MDM  No diagnosis found. 57 year old male with chest pain intermittently for 4 days, but constant for the last 12+ hours.  Now pain-free.  Initial troponin, negative.  He does have some T-wave inversion that is unchanged from prior.  Plan for second troponin, and check d-dimer given recent vascular intervention.  If elevated, may need CT angio of chest.  7:12 AM Elevated ddimer.  CTA chest ordered.  Care passed to Dr Gwendolyn Grant awaiting CTA  chest results.  Olivia Mackie, MD 08/10/13 [phone]

## 2014-01-06 ENCOUNTER — Other Ambulatory Visit: Payer: Self-pay | Admitting: Emergency Medicine

## 2014-01-12 ENCOUNTER — Other Ambulatory Visit: Payer: Self-pay | Admitting: Emergency Medicine

## 2014-01-12 ENCOUNTER — Encounter: Payer: Self-pay | Admitting: Emergency Medicine

## 2014-01-12 ENCOUNTER — Ambulatory Visit (INDEPENDENT_AMBULATORY_CARE_PROVIDER_SITE_OTHER): Payer: PRIVATE HEALTH INSURANCE | Admitting: Emergency Medicine

## 2014-01-12 VITALS — BP 136/86 | HR 70 | Temp 97.7°F | Resp 16 | Ht 71.5 in | Wt 234.0 lb

## 2014-01-12 DIAGNOSIS — R739 Hyperglycemia, unspecified: Secondary | ICD-10-CM

## 2014-01-12 DIAGNOSIS — E785 Hyperlipidemia, unspecified: Secondary | ICD-10-CM

## 2014-01-12 DIAGNOSIS — Z23 Encounter for immunization: Secondary | ICD-10-CM

## 2014-01-12 DIAGNOSIS — I10 Essential (primary) hypertension: Secondary | ICD-10-CM

## 2014-01-12 DIAGNOSIS — Z1211 Encounter for screening for malignant neoplasm of colon: Secondary | ICD-10-CM

## 2014-01-12 DIAGNOSIS — Z125 Encounter for screening for malignant neoplasm of prostate: Secondary | ICD-10-CM

## 2014-01-12 DIAGNOSIS — R7309 Other abnormal glucose: Secondary | ICD-10-CM

## 2014-01-12 LAB — CBC WITH DIFFERENTIAL/PLATELET
BASOS ABS: 0 10*3/uL (ref 0.0–0.1)
Basophils Relative: 0 % (ref 0–1)
EOS PCT: 1 % (ref 0–5)
Eosinophils Absolute: 0 10*3/uL (ref 0.0–0.7)
HCT: 43.8 % (ref 39.0–52.0)
Hemoglobin: 15.4 g/dL (ref 13.0–17.0)
LYMPHS PCT: 43 % (ref 12–46)
Lymphs Abs: 1.5 10*3/uL (ref 0.7–4.0)
MCH: 29 pg (ref 26.0–34.0)
MCHC: 35.2 g/dL (ref 30.0–36.0)
MCV: 82.5 fL (ref 78.0–100.0)
Monocytes Absolute: 0.2 10*3/uL (ref 0.1–1.0)
Monocytes Relative: 6 % (ref 3–12)
NEUTROS ABS: 1.8 10*3/uL (ref 1.7–7.7)
NEUTROS PCT: 50 % (ref 43–77)
PLATELETS: 234 10*3/uL (ref 150–400)
RBC: 5.31 MIL/uL (ref 4.22–5.81)
RDW: 13.3 % (ref 11.5–15.5)
WBC: 3.5 10*3/uL — AB (ref 4.0–10.5)

## 2014-01-12 LAB — COMPLETE METABOLIC PANEL WITH GFR
ALT: 33 U/L (ref 0–53)
AST: 34 U/L (ref 0–37)
Albumin: 4.5 g/dL (ref 3.5–5.2)
Alkaline Phosphatase: 40 U/L (ref 39–117)
BUN: 11 mg/dL (ref 6–23)
CO2: 31 mEq/L (ref 19–32)
CREATININE: 1.02 mg/dL (ref 0.50–1.35)
Calcium: 10.2 mg/dL (ref 8.4–10.5)
Chloride: 102 mEq/L (ref 96–112)
GFR, Est African American: >89 mL/min
GFR, Est Non African American: 82 mL/min
Glucose, Bld: 67 mg/dL — ABNORMAL LOW (ref 70–99)
Potassium: 4.1 mEq/L (ref 3.5–5.3)
SODIUM: 139 meq/L (ref 135–145)
TOTAL PROTEIN: 7 g/dL (ref 6.0–8.3)
Total Bilirubin: 0.7 mg/dL (ref 0.2–1.2)

## 2014-01-12 LAB — IFOBT (OCCULT BLOOD): IFOBT: NEGATIVE

## 2014-01-12 LAB — LIPID PANEL
Cholesterol: 171 mg/dL (ref 0–200)
HDL: 64 mg/dL (ref >39)
LDL CALC: 92 mg/dL (ref 0–99)
TRIGLYCERIDES: 76 mg/dL (ref <150)
Total CHOL/HDL Ratio: 2.7 Ratio
VLDL: 15 mg/dL (ref 0–40)

## 2014-01-12 LAB — POCT GLYCOSYLATED HEMOGLOBIN (HGB A1C): HEMOGLOBIN A1C: 5.8

## 2014-01-12 MED ORDER — HYDROCHLOROTHIAZIDE 12.5 MG PO CAPS: ORAL_CAPSULE | ORAL | Status: DC

## 2014-01-12 NOTE — Progress Notes (Signed)
   This chart was scribed for Darlyne Russian, MD by Elby Beck, Scribe. This patient was seen in room 23 and the patient's care was started at 10:10 AM.  Subjective:    Patient ID: Michael Church, male    DOB: Jul 17, 1956, 58 y.o.   MRN: [id number]  HPI  HPI Comments: Michael Church is a 58 y.o. male who [facility] for a follow-up evaluation of his blood pressure. His blood pressure here today is 136/86. He was seen here on 08/09/13 for chest pain and had a visit in the Emergency room on 08/10/14 for this chest pain, and states that the pain resolved after receiving a GI cocktail. He denies having any chest pain recently and also denies any SOB. He reports feeling well today. He notes that he exercises at the gym 4 times a week, and states that he has been losing weight intentionally. He states that he has had a normal stress test in the past. He states that he has not had his prostate checked recently. He reports that his father died of a CVA at age 62. He denies any other family history of cardiovascular disease.   Review of Systems     Objective:   Physical Exam  CONSTITUTIONAL: Well developed/well nourished HEAD: Normocephalic/atraumatic EYES: EOMI/PERRL ENMT: Mucous membranes moist NECK: supple no meningeal signs SPINE:entire spine nontender CV: S1/S2 noted, no murmurs/rubs/gallops noted LUNGS: Lungs are clear to auscultation bilaterally, no apparent distress ABDOMEN: soft, nontender, no rebound or guarding GU:no cva tenderness NEURO: Pt is awake/alert, moves all extremitiesx4 EXTREMITIES: pulses normal, full ROM SKIN: warm, color normal PSYCH: no abnormalities of mood noted Results for orders placed in visit on 01/12/14  POCT GLYCOSYLATED HEMOGLOBIN (HGB A1C)      Result Value Ref Range   Hemoglobin A1C 5.8    IFOBT (OCCULT BLOOD)      Result Value Ref Range   IFOBT Negative        BP 136/86  Pulse 70  Temp(Src) 97.7 F (36.5 C)   Resp 16  Ht 5' 11.5" (1.816 m)  Wt 234 lb (106.142 kg)  BMI 32.19 kg/m2  SpO2 98% Assessment & Plan:  No diagnosis found. Patient is doing well he exercises regularly. He is on a strenuous exercise program. With his history of hypertension and age over 5 we'll go ahead and set him up to the cardiologist for eval I personally performed the services described in this documentation, which was scribed in my presence. The recorded information has been reviewed and is accurate.

## 2014-01-13 LAB — HIV ANTIBODY (ROUTINE TESTING W REFLEX): HIV 1&2 Ab, 4th Generation: NONREACTIVE

## 2014-01-14 LAB — PATHOLOGIST SMEAR REVIEW

## 2014-02-11 NOTE — Progress Notes (Signed)
HPI Patient is a 58 yo who is referred for cardiac risk stratification.  He has a history of HTN The patinet was evaluated for abdominal pain/chest pain in ER in Nov 2014.  Felt atypical.  Patient describes as abdominal pain that became epigastric.  Not related to activity  Went away with GI cocktail The patinet has had none since He is active, exercises regularly.  Works out on elliptical. Denies other types of CP  No SOB   Allergies  Allergen Reactions  . Other Other (See Comments)    Foods - cantalopes, tomatoes, and  raw carrots  . Penicillins Itching    Current Outpatient Prescriptions  Medication Sig Dispense Refill  . hydrochlorothiazide (MICROZIDE) 12.5 MG capsule Take one tablet daily  90 capsule  3  . ibuprofen (ADVIL,MOTRIN) 200 MG tablet Take 600 mg by mouth every 6 (six) hours as needed for moderate pain.      . Multiple Vitamin (MULTIVITAMIN WITH MINERALS) TABS Take 1 tablet by mouth daily.       No current facility-administered medications for this visit.    Past Medical History  Diagnosis Date  . Prehypertension   . Hypertension     Past Surgical History  Procedure Laterality Date  . Left knee torn meniscus  03/2011  . Cervical spine surgery  10/2011  . Varicose vein surgery      Family History  Problem Relation Age of Onset  . Stroke Father     age 57    History   Social History  . Marital Status: Married    Spouse Name: N/A    Number of Children: N/A  . Years of Education: N/A   Occupational History  . Not on file.   Social History Main Topics  . Smoking status: Former Smoker -- 1 years    Types: Cigars  . Smokeless tobacco: Never Used  . Alcohol Use: 0.5 oz/week    1 drink(s) per week     Comment: OCCASIONALLY DRINK - BEER AND MIXED DRINKS  . Drug Use: No  . Sexual Activity: Not on file   Other Topics Concern  . Not on file   Social History Narrative  . No narrative on file    Review of Systems:  All systems reviewed.  They are  negative to the above problem except as previously stated.  Vital Signs: BP 140/90  Pulse 70  Ht 6' (1.829 m)  Wt 238 lb (107.956 kg)  BMI 32.27 kg/m2  Physical Exam  HEENT:  Normocephalic, atraumatic. EOMI, PERRLA.  Neck: JVP is normal.  No bruits.  Lungs: clear to auscultation. No rales no wheezes.  Heart: Regular rate and rhythm. Normal S1, S2. No S3.   No significant murmurs. PMI not displaced.  Abdomen:  Supple, nontender. Normal bowel sounds. No masses. No hepatomegaly.  Extremities:   Good distal pulses throughout. No lower extremity edema.  Musculoskeletal :moving all extremities.  Neuro:   alert and oriented x3.  CN II-XII grossly intact.  EKG  SR 70  Assessment and Plan:  1.  CP  Patient had episode of abdominal/epigastric pain that was prob GI in orign  I am not convinced of any symptoms of angina  He is active  WOuld follow  2.  HTN  I would not change meds based on one visit  WIll need f/u  3.  HCM  Hs he needs to lose wt  Goal :  220 lbs   Lipid panel in April is  very good.    Will be available as needed.

## 2014-02-12 ENCOUNTER — Encounter: Payer: Self-pay | Admitting: Internal Medicine

## 2014-02-12 ENCOUNTER — Ambulatory Visit (INDEPENDENT_AMBULATORY_CARE_PROVIDER_SITE_OTHER): Payer: PRIVATE HEALTH INSURANCE | Admitting: Internal Medicine

## 2014-02-12 VITALS — BP 140/90 | HR 70 | Ht 72.0 in | Wt 238.0 lb

## 2014-02-12 DIAGNOSIS — I10 Essential (primary) hypertension: Secondary | ICD-10-CM

## 2014-02-12 NOTE — Patient Instructions (Signed)
Your physician recommends that you continue on your current medications as directed. Please refer to the Current Medication list given to you today.     

## 2014-02-15 ENCOUNTER — Ambulatory Visit: Payer: PRIVATE HEALTH INSURANCE | Admitting: Internal Medicine

## 2014-07-13 ENCOUNTER — Other Ambulatory Visit: Payer: Self-pay | Admitting: Emergency Medicine

## 2014-07-13 ENCOUNTER — Ambulatory Visit: Payer: PRIVATE HEALTH INSURANCE | Admitting: Emergency Medicine

## 2014-07-13 ENCOUNTER — Ambulatory Visit (INDEPENDENT_AMBULATORY_CARE_PROVIDER_SITE_OTHER): Payer: PRIVATE HEALTH INSURANCE | Admitting: Emergency Medicine

## 2014-07-13 ENCOUNTER — Encounter: Payer: Self-pay | Admitting: Emergency Medicine

## 2014-07-13 VITALS — BP 124/77 | HR 60 | Temp 98.0°F | Resp 16 | Ht 71.0 in | Wt 232.2 lb

## 2014-07-13 DIAGNOSIS — I10 Essential (primary) hypertension: Secondary | ICD-10-CM

## 2014-07-13 DIAGNOSIS — Z23 Encounter for immunization: Secondary | ICD-10-CM

## 2014-07-13 DIAGNOSIS — Z13220 Encounter for screening for lipoid disorders: Secondary | ICD-10-CM

## 2014-07-13 LAB — CBC WITH DIFFERENTIAL/PLATELET
BASOS PCT: 0 % (ref 0–1)
Basophils Absolute: 0 10*3/uL (ref 0.0–0.1)
EOS ABS: 0 10*3/uL (ref 0.0–0.7)
Eosinophils Relative: 1 % (ref 0–5)
HEMATOCRIT: 47 % (ref 39.0–52.0)
HEMOGLOBIN: 16.1 g/dL (ref 13.0–17.0)
Lymphocytes Relative: 41 % (ref 12–46)
Lymphs Abs: 1.4 10*3/uL (ref 0.7–4.0)
MCH: 28.6 pg (ref 26.0–34.0)
MCHC: 34.3 g/dL (ref 30.0–36.0)
MCV: 83.6 fL (ref 78.0–100.0)
MONO ABS: 0.2 10*3/uL (ref 0.1–1.0)
MONOS PCT: 5 % (ref 3–12)
Neutro Abs: 1.7 10*3/uL (ref 1.7–7.7)
Neutrophils Relative %: 53 % (ref 43–77)
Platelets: 226 10*3/uL (ref 150–400)
RBC: 5.62 MIL/uL (ref 4.22–5.81)
RDW: 13.7 % (ref 11.5–15.5)
WBC: 3.3 10*3/uL — ABNORMAL LOW (ref 4.0–10.5)

## 2014-07-13 LAB — COMPLETE METABOLIC PANEL WITH GFR
ALBUMIN: 4.4 g/dL (ref 3.5–5.2)
ALK PHOS: 45 U/L (ref 39–117)
ALT: 20 U/L (ref 0–53)
AST: 20 U/L (ref 0–37)
BILIRUBIN TOTAL: 0.5 mg/dL (ref 0.2–1.2)
BUN: 12 mg/dL (ref 6–23)
CO2: 26 mEq/L (ref 19–32)
Calcium: 9.2 mg/dL (ref 8.4–10.5)
Chloride: 104 mEq/L (ref 96–112)
Creat: 1 mg/dL (ref 0.50–1.35)
GFR, EST NON AFRICAN AMERICAN: 84 mL/min
GFR, Est African American: >89 mL/min
GLUCOSE: 97 mg/dL (ref 70–99)
Potassium: 4.4 mEq/L (ref 3.5–5.3)
Sodium: 138 mEq/L (ref 135–145)
Total Protein: 7.2 g/dL (ref 6.0–8.3)

## 2014-07-13 LAB — LIPID PANEL
CHOL/HDL RATIO: 3.1 ratio
Cholesterol: 177 mg/dL (ref 0–200)
HDL: 57 mg/dL (ref >39)
LDL Cholesterol: 106 mg/dL — ABNORMAL HIGH (ref 0–99)
Triglycerides: 72 mg/dL (ref <150)
VLDL: 14 mg/dL (ref 0–40)

## 2014-07-13 NOTE — Progress Notes (Signed)
Subjective:  This chart was scribed for [name], MD by Donato Schultz, Medical Scribe. This patient was seen in Room 23 and the patient's care was started at 9:35 AM.   Patient ID: Michael Church, male    DOB: [birth year], 58 y.o.   MRN: [id number]  HPI HPI Comments: Michael Church is a 58 y.o. male who presents to the [facility] for his 6 month hypertension follow-up.  He denies chest pain, SOB, and bowel problems as associated symptoms.  His last PSA was 1.29 and he had a colonoscopy 2 years ago which did not reveal any abnormalities.  He would like to receive a flu shot today.  He goes to the gym 4 times a week and walks twice a week.  He does not have a significant family history.  His father had a TIA at 46.     Patient Active Problem List   Diagnosis Date Noted   Hyperglycemia 01/12/2014   Hypertension 09/22/2012   Diabetes 09/22/2012   Varicose vein of leg 09/22/2012   Past Medical History  Diagnosis Date   Prehypertension    Hypertension    Past Surgical History  Procedure Laterality Date   Left knee torn meniscus  03/2011   Cervical spine surgery  10/2011   Varicose vein surgery     Allergies  Allergen Reactions   Other Other (See Comments)    Foods - cantalopes, tomatoes, and  raw carrots   Penicillins Itching   Prior to Admission medications   Medication Sig Start Date End Date Taking? Authorizing Provider  hydrochlorothiazide (MICROZIDE) 12.5 MG capsule Take one tablet daily 01/12/14   Darlyne Russian, MD  ibuprofen (ADVIL,MOTRIN) 200 MG tablet Take 600 mg by mouth every 6 (six) hours as needed for moderate pain.    Historical Provider, MD  Multiple Vitamin (MULTIVITAMIN WITH MINERALS) TABS Take 1 tablet by mouth daily.    Historical Provider, MD   History   Social History   Marital Status: Married    Spouse Name: N/A    Number of Children: N/A   Years of Education: N/A   Occupational History   Not on file.   Social  History Main Topics   Smoking status: Former Smoker -- 1 years    Types: Cigars   Smokeless tobacco: Never Used   Alcohol Use: 0.5 oz/week    1 drink(s) per week     Comment: OCCASIONALLY DRINK - BEER AND MIXED DRINKS   Drug Use: No   Sexual Activity: Not on file   Other Topics Concern   Not on file   Social History Narrative   No narrative on file     Review of Systems  Respiratory: Negative for shortness of breath.   Cardiovascular: Negative for chest pain.  Gastrointestinal: Negative for diarrhea and constipation.     Objective:  Physical Exam  Nursing note and vitals reviewed. Constitutional: He is oriented to person, place, and time. He appears well-developed and well-nourished.  HENT:  Head: Normocephalic and atraumatic.  Right Ear: Tympanic membrane, external ear and ear canal normal.  Left Ear: Tympanic membrane, external ear and ear canal normal.  Mouth/Throat: Oropharynx is clear and moist. No oropharyngeal exudate.  Eyes: Conjunctivae and EOM are normal. Pupils are equal, round, and reactive to light.  Neck: Normal range of motion. Neck supple. No thyromegaly present.  Cardiovascular: Normal rate, regular rhythm and normal heart sounds.   No murmur heard. Pulmonary/Chest: Effort  normal and breath sounds normal. No respiratory distress. He has no wheezes. He has no rales.  Abdominal: Soft. There is no tenderness.  Musculoskeletal: Normal range of motion.  Lymphadenopathy:    He has no cervical adenopathy.  Neurological: He is alert and oriented to person, place, and time.  Skin: Skin is warm and dry.  Psychiatric: He has a normal mood and affect. His behavior is normal.    BP 124/77   Pulse 60   Temp(Src) 98 F (36.7 C) (Oral)   Resp 16   Ht 5\' 11"  (1.803 m)   Wt 232 lb 3.2 oz (105.325 kg)   BMI 32.40 kg/m2   SpO2 97% Assessment & Plan:  No change in medications.  He is not a diabetic.  He has had mild elevation of sugar in the past.  Flu shot given  today.   I personally performed the services described in this documentation, which was scribed in my presence. The recorded information has been reviewed and is accurate.

## 2014-07-14 LAB — HIV ANTIBODY (ROUTINE TESTING W REFLEX): HIV: NONREACTIVE

## 2014-07-15 LAB — PATHOLOGIST SMEAR REVIEW

## 2014-07-23 ENCOUNTER — Telehealth: Payer: Self-pay

## 2014-07-23 NOTE — Telephone Encounter (Signed)
Dr. Everlene Farrier, can you please review pt's Path Review and HIV test. Pt calling about the results. Thanks

## 2014-07-23 NOTE — Telephone Encounter (Signed)
Constipation his HIV test is negative and the pathologist did not see any abnormal cells on this smear he should have a repeat CBC done in about 3 months

## 2014-07-28 NOTE — Telephone Encounter (Signed)
LM for pt to RTC in Feb 2016 rtn call if any questions.

## 2014-12-06 ENCOUNTER — Ambulatory Visit (INDEPENDENT_AMBULATORY_CARE_PROVIDER_SITE_OTHER): Payer: PRIVATE HEALTH INSURANCE

## 2014-12-06 ENCOUNTER — Ambulatory Visit (INDEPENDENT_AMBULATORY_CARE_PROVIDER_SITE_OTHER): Payer: PRIVATE HEALTH INSURANCE | Admitting: Internal Medicine

## 2014-12-06 VITALS — BP 108/76 | HR 96 | Temp 99.3°F | Resp 19 | Ht 71.75 in | Wt 241.8 lb

## 2014-12-06 DIAGNOSIS — R1030 Lower abdominal pain, unspecified: Secondary | ICD-10-CM | POA: Diagnosis not present

## 2014-12-06 LAB — POCT URINALYSIS DIPSTICK
Bilirubin, UA: NEGATIVE
Glucose, UA: NEGATIVE
Ketones, UA: NEGATIVE
Leukocytes, UA: NEGATIVE
Nitrite, UA: NEGATIVE
Protein, UA: NEGATIVE
Spec Grav, UA: 1.02
Urobilinogen, UA: 0.2
pH, UA: 5.5

## 2014-12-06 LAB — POCT CBC
GRANULOCYTE PERCENT: 80.6 % — AB (ref 37–80)
HCT, POC: 47.5 % (ref 43.5–53.7)
Hemoglobin: 15.4 g/dL (ref 14.1–18.1)
Lymph, poc: 1.6 (ref 0.6–3.4)
MCH: 28.4 pg (ref 27–31.2)
MCHC: 32.5 g/dL (ref 31.8–35.4)
MCV: 87.5 fL (ref 80–97)
MID (CBC): 0.3 (ref 0–0.9)
MPV: 6.9 fL (ref 0–99.8)
POC Granulocyte: 7.7 — AB (ref 2–6.9)
POC LYMPH PERCENT: 16.5 %L (ref 10–50)
POC MID %: 2.9 %M (ref 0–12)
Platelet Count, POC: 263 10*3/uL (ref 142–424)
RBC: 5.42 M/uL (ref 4.69–6.13)
RDW, POC: 13.3 %
WBC: 9.6 10*3/uL (ref 4.6–10.2)

## 2014-12-06 LAB — POCT UA - MICROSCOPIC ONLY
Bacteria, U Microscopic: NEGATIVE
Casts, Ur, LPF, POC: NEGATIVE
Crystals, Ur, HPF, POC: NEGATIVE
Yeast, UA: NEGATIVE

## 2014-12-06 MED ORDER — NAPROXEN 500 MG PO TABS: 500.0000 mg | ORAL_TABLET | Freq: Three times a day (TID) | ORAL | Status: DC

## 2014-12-06 MED ORDER — DOCUSATE SODIUM 100 MG PO CAPS: 100.0000 mg | ORAL_CAPSULE | Freq: Two times a day (BID) | ORAL | Status: DC

## 2014-12-06 MED ORDER — KETOROLAC TROMETHAMINE 60 MG/2ML IM SOLN
60.0000 mg | Freq: Once | INTRAMUSCULAR | Status: AC
  Administered 2014-12-06: 60 mg via INTRAMUSCULAR

## 2014-12-06 MED ORDER — POLYETHYLENE GLYCOL 3350 17 GM/SCOOP PO POWD: 17.0000 g | Freq: Three times a day (TID) | ORAL | Status: DC

## 2014-12-06 MED ORDER — HYDROCODONE-ACETAMINOPHEN 10-325 MG PO TABS: 1.0000 | ORAL_TABLET | Freq: Four times a day (QID) | ORAL | Status: DC | PRN

## 2014-12-06 NOTE — Progress Notes (Signed)
12/06/2014 at 10:04 PM  Oretha Milch / DOB: 09-Jan-1956 / MRN: [id number]  The patient has Hypertension; Varicose vein of leg; and Hyperglycemia on his problem list.  SUBJECTIVE  Chief complaint: Abdominal Pain; Dizziness; Fatigue; and Groin Pain   History of present illness: Mr. Michael Church is 59 y.o. well appearing anicteric male presenting for abdominal pain, dizziness, fatigue a groin pain.   He reports his symptoms started three days ago, and this is the worst pain that he has ever felt.  He states he is having back pain that radiates to his groin, and reports that the pain comes and goes.  He has never had a kidney stone and denies hematuria, but does report urinary frequency and urgency.  He denies cough but reports HA.  He take HCTZ for blood pressure control and is compliant with therapy.   He  has a past medical history of Prehypertension and Hypertension.    She has a current medication list which includes the following prescription(s): hydrochlorothiazide, ibuprofen, multivitamin with minerals, docusate sodium, hydrocodone-acetaminophen, naproxen, and polyethylene glycol powder.  Mr. Lobato is allergic to other and penicillins. He  reports that he has never smoked. He has never used smokeless tobacco. He reports that he drinks about 0.5 oz of alcohol per week. He reports that he does not use illicit drugs. He  has no sexual activity history on file. The patient  has past surgical history that includes left knee torn meniscus (03/2011); Cervical spine surgery (10/2011); and Varicose vein surgery.  His family history includes Stroke in his father.  Review of Systems  Constitutional: Negative for fever, chills and weight loss.  HENT: Negative for congestion and sore throat.   Respiratory: Negative for cough.   Cardiovascular: Negative.  Negative for chest pain and palpitations.  Gastrointestinal: Positive for abdominal pain. Negative for heartburn, vomiting, diarrhea, constipation,  blood in stool and melena.  Genitourinary: Positive for urgency and flank pain. Negative for dysuria, frequency and hematuria.  Skin: Negative for itching and rash.  Neurological: Positive for headaches.    OBJECTIVE  His  height is 5' 11.75" (1.822 m) and weight is 241 lb 12.8 oz (109.68 kg). His oral temperature is 99.3 F (37.4 C). His blood pressure is 108/76 and his pulse is 96. His respiration is 19 and oxygen saturation is 97%.  The patient's body mass index is 33.04 kg/(m^2).  Physical Exam  Constitutional: He is oriented to person, place, and time.  HENT:  Right Ear: External ear normal.  Left Ear: External ear normal.  Nose: Nose normal.  Mouth/Throat: Oropharynx is clear and moist. No oropharyngeal exudate.  Eyes: Conjunctivae are normal. Right eye exhibits no discharge. Left eye exhibits no discharge. No scleral icterus.  Cardiovascular: Normal rate and regular rhythm.  Exam reveals no gallop and no friction rub.   No murmur heard. Respiratory: Effort normal and breath sounds normal.  GI: Soft. Bowel sounds are normal. He exhibits no shifting dullness, no distension, no pulsatile liver, no fluid wave, no abdominal bruit, no ascites, no pulsatile midline mass and no mass. There is tenderness in the suprapubic area. There is CVA tenderness (left sided and mild). There is no rigidity, no rebound, no guarding, no tenderness at McBurney's point and negative Murphy's sign. No hernia.    Musculoskeletal: Normal range of motion.  Lymphadenopathy:    He has no cervical adenopathy.  Neurological: He is alert and oriented to person, place, and time.  Skin: Skin is warm and dry.  Psychiatric: He has a normal mood and affect. His behavior is normal. Judgment and thought content normal.    Results for orders placed or performed in visit on 12/06/14 (from the past 24 hour(s))  POCT CBC     Status: Abnormal   Collection Time: 12/06/14  3:50 PM  Result Value Ref Range   WBC 9.6 4.6 -  10.2 K/uL   Lymph, poc 1.6 0.6 - 3.4   POC LYMPH PERCENT 16.5 10 - 50 %L   MID (cbc) 0.3 0 - 0.9   POC MID % 2.9 0 - 12 %M   POC Granulocyte 7.7 (A) 2 - 6.9   Granulocyte percent 80.6 (A) 37 - 80 %G   RBC 5.42 4.69 - 6.13 M/uL   Hemoglobin 15.4 14.1 - 18.1 g/dL   HCT, POC 47.5 43.5 - 53.7 %   MCV 87.5 80 - 97 fL   MCH, POC 28.4 27 - 31.2 pg   MCHC 32.5 31.8 - 35.4 g/dL   RDW, POC 13.3 %   Platelet Count, POC 263 142 - 424 K/uL   MPV 6.9 0 - 99.8 fL  POCT urinalysis dipstick     Status: None   Collection Time: 12/06/14  3:50 PM  Result Value Ref Range   Color, UA yellow    Clarity, UA clear    Glucose, UA neg    Bilirubin, UA neg    Ketones, UA neg    Spec Grav, UA 1.020    Blood, UA small    pH, UA 5.5    Protein, UA neg    Urobilinogen, UA 0.2    Nitrite, UA neg    Leukocytes, UA Negative   POCT UA - Microscopic Only     Status: None   Collection Time: 12/06/14  3:50 PM  Result Value Ref Range   WBC, Ur, HPF, POC 0-1    RBC, urine, microscopic 0-2    Bacteria, U Microscopic neg    Mucus, UA trace    Epithelial cells, urine per micros 0-2    Crystals, Ur, HPF, POC neg    Casts, Ur, LPF, POC neg    Yeast, UA neg    [facility] reading (PRIMARY) by  Dr. Elder Cyphers: Unable to assess kidneys 2/2 to large stool burden.  Mild ileus noted.    ASSESSMENT & PLAN  Kailer was seen today for abdominal pain, dizziness, fatigue and groin pain.  Diagnoses and all orders for this visit:  Lower abdominal pain: HPI and physical exam pointing to a urinary etiology. Work up largely unrevealing.  Will treat empirically for nephrolithiasis via NSAIDS, opioids, and strainer provided and he has been advised to drink copious fluids.  Will treat for constipation with Miralax and Colace. Patient to f/u in 24 hours.  If his pain persist will order a CT of the abdomen to further assess for stone.   Orders: -     POCT CBC -     POCT urinalysis dipstick -     POCT UA - Microscopic Only -      Comprehensive metabolic panel -     DG Abd 2 Views; Future  Meds ordered this encounter  Medications  . ketorolac (TORADOL) injection 60 mg    Sig:   . HYDROcodone-acetaminophen (NORCO) 10-325 MG per tablet    Sig: Take 1 tablet by mouth every 6 (six) hours as needed for severe pain.    Dispense:  30 tablet    Refill:  0  Order Specific Question:  Supervising Provider    Answer:  Carlota Raspberry, JEFFREY R [[id number]]  . naproxen (NAPROSYN) 500 MG tablet    Sig: Take 1 tablet (500 mg total) by mouth 3 (three) times daily with meals.    Dispense:  30 tablet    Refill:  0    Order Specific Question:  Supervising Provider    Answer:  Carlota Raspberry, JEFFREY R [[id number]]  . docusate sodium (COLACE) 100 MG capsule    Sig: Take 1 capsule (100 mg total) by mouth 2 (two) times daily.    Dispense:  10 capsule    Refill:  0    Order Specific Question:  Supervising Provider    Answer:  Carlota Raspberry, JEFFREY R [[id number]]  . polyethylene glycol powder (GLYCOLAX/MIRALAX) powder    Sig: Take 17 g by mouth 3 (three) times daily.    Dispense:  3350 g    Refill:  1    Order Specific Question:  Supervising Provider    Answer:  Carlota Raspberry, JEFFREY R [[id number]]    The patient was advised to call or come back to clinic if he does not see an improvement in symptoms, or worsens with the above plan.   Philis Fendt, MHS, PA-C [facility] 12/06/2014 10:04 PM

## 2014-12-07 ENCOUNTER — Ambulatory Visit (INDEPENDENT_AMBULATORY_CARE_PROVIDER_SITE_OTHER): Payer: PRIVATE HEALTH INSURANCE | Admitting: Physician Assistant

## 2014-12-07 VITALS — BP 122/86 | HR 68 | Temp 98.0°F | Resp 16 | Ht 71.75 in | Wt 245.0 lb

## 2014-12-07 DIAGNOSIS — Z09 Encounter for follow-up examination after completed treatment for conditions other than malignant neoplasm: Secondary | ICD-10-CM

## 2014-12-07 DIAGNOSIS — R1030 Lower abdominal pain, unspecified: Secondary | ICD-10-CM | POA: Diagnosis not present

## 2014-12-07 DIAGNOSIS — N200 Calculus of kidney: Secondary | ICD-10-CM

## 2014-12-07 LAB — URINE CULTURE
COLONY COUNT: NO GROWTH
Organism ID, Bacteria: NO GROWTH

## 2014-12-07 LAB — POCT URINALYSIS DIPSTICK
Bilirubin, UA: NEGATIVE
GLUCOSE UA: NEGATIVE
Ketones, UA: NEGATIVE
Leukocytes, UA: NEGATIVE
NITRITE UA: NEGATIVE
Protein, UA: NEGATIVE
Spec Grav, UA: <=1.005
UROBILINOGEN UA: 0.2
pH, UA: 5.5

## 2014-12-07 LAB — COMPREHENSIVE METABOLIC PANEL
ALBUMIN: 4.6 g/dL (ref 3.5–5.2)
ALT: 25 U/L (ref 0–53)
AST: 22 U/L (ref 0–37)
Alkaline Phosphatase: 45 U/L (ref 39–117)
BILIRUBIN TOTAL: 1 mg/dL (ref 0.2–1.2)
BUN: 12 mg/dL (ref 6–23)
CHLORIDE: 99 meq/L (ref 96–112)
CO2: 30 meq/L (ref 19–32)
Calcium: 9.6 mg/dL (ref 8.4–10.5)
Creat: 1.12 mg/dL (ref 0.50–1.35)
GLUCOSE: 95 mg/dL (ref 70–99)
POTASSIUM: 4.2 meq/L (ref 3.5–5.3)
SODIUM: 138 meq/L (ref 135–145)
TOTAL PROTEIN: 7.2 g/dL (ref 6.0–8.3)

## 2014-12-07 LAB — POCT UA - MICROSCOPIC ONLY
Bacteria, U Microscopic: NEGATIVE
CASTS, UR, LPF, POC: NEGATIVE
Crystals, Ur, HPF, POC: NEGATIVE
Epithelial cells, urine per micros: NEGATIVE
MUCUS UA: NEGATIVE
YEAST UA: NEGATIVE

## 2014-12-07 LAB — POCT CBC
GRANULOCYTE PERCENT: 62 % (ref 37–80)
HEMATOCRIT: 46.6 % (ref 43.5–53.7)
Hemoglobin: 15 g/dL (ref 14.1–18.1)
LYMPH, POC: 2.3 (ref 0.6–3.4)
MCH, POC: 28.2 pg (ref 27–31.2)
MCHC: 32.1 g/dL (ref 31.8–35.4)
MCV: 87.7 fL (ref 80–97)
MID (cbc): 0.3 (ref 0–0.9)
MPV: 6.7 fL (ref 0–99.8)
POC GRANULOCYTE: 4.2 (ref 2–6.9)
POC LYMPH PERCENT: 33.1 %L (ref 10–50)
POC MID %: 4.9 % (ref 0–12)
Platelet Count, POC: 241 10*3/uL (ref 142–424)
RBC: 5.31 M/uL (ref 4.69–6.13)
RDW, POC: 13.1 %
WBC: 6.8 10*3/uL (ref 4.6–10.2)

## 2014-12-07 MED ORDER — TERAZOSIN HCL 2 MG PO CAPS: 2.0000 mg | ORAL_CAPSULE | Freq: Once | ORAL | Status: DC

## 2014-12-07 NOTE — Progress Notes (Signed)
12/07/2014 at 3:44 PM  Michael Church / DOB: 05-10-1956 / MRN: [id number]  The patient has Hypertension; Varicose vein of leg; and Hyperglycemia on his problem list.  SUBJECTIVE  Chief complaint: Follow-up   History of present illness: Michael Church is 59 y.o. well appearing male presenting for follow up of lower abdominal pain that started roughly 4 days ago.  Work up yesterday was largely unrevealing.  Xray showed moderate stool burden and urine with minimal hematuria.  He reports 50% better today.  Has been taking NSAIDS per prescription and required 10 mg of hydrocodone last night and this morning.  Has been taking laxatives and is moving his bowel better today.  He is vigorously hydrating, which he states is also helping his pain.    He  has a past medical history of Prehypertension and Hypertension.    He has a current medication list which includes the following prescription(s): docusate sodium, hydrochlorothiazide, hydrocodone-acetaminophen, ibuprofen, multivitamin with minerals, naproxen, and polyethylene glycol powder.  Michael Church is allergic to other and penicillins. He  reports that he has never smoked. He has never used smokeless tobacco. He reports that he drinks about 0.5 oz of alcohol per week. He reports that he does not use illicit drugs. He  has no sexual activity history on file. The patient  has past surgical history that includes left knee torn meniscus (03/2011); Cervical spine surgery (10/2011); and Varicose vein surgery.  His family history includes Stroke in his father.  Review of Systems  Constitutional: Negative for fever and chills.  HENT: Negative.   Cardiovascular: Negative.   Gastrointestinal: Positive for abdominal pain. Negative for nausea, diarrhea and constipation.  Genitourinary: Negative for dysuria, urgency, frequency, hematuria and flank pain.  Musculoskeletal: Negative for myalgias.  Skin: Negative.   Neurological: Negative for dizziness.     OBJECTIVE  His  height is 5' 11.75" (1.822 m) and weight is 245 lb (111.131 kg). His oral temperature is 98 F (36.7 C). His blood pressure is 122/86 and his pulse is 68. His respiration is 16 and oxygen saturation is 98%.  The patient's body mass index is 33.48 kg/(m^2).  Physical Exam  Constitutional: He is oriented to person, place, and time. He appears well-developed and well-nourished. No distress.  Cardiovascular: Normal rate and regular rhythm.   Respiratory: Effort normal and breath sounds normal.  GI: Soft. Bowel sounds are normal. He exhibits no distension and no mass. There is tenderness (improved from yesterday's exam) in the suprapubic area. There is no rebound, no guarding and no CVA tenderness.  Musculoskeletal: Normal range of motion.  Neurological: He is alert and oriented to person, place, and time. No cranial nerve deficit.  Skin: Skin is warm and dry. No rash noted. He is not diaphoretic. No erythema. No pallor.  Psychiatric: He has a normal mood and affect. His behavior is normal. Judgment and thought content normal.    Results for orders placed or performed in visit on 12/07/14 (from the past 24 hour(s))  POCT urinalysis dipstick     Status: None   Collection Time: 12/07/14  3:12 PM  Result Value Ref Range   Color, UA yellow    Clarity, UA clear    Glucose, UA neg    Bilirubin, UA neg    Ketones, UA neg    Spec Grav, UA <=1.005    Blood, UA moderate    pH, UA 5.5    Protein, UA neg    Urobilinogen, UA 0.2  Nitrite, UA neg    Leukocytes, UA Negative   POCT UA - Microscopic Only     Status: None   Collection Time: 12/07/14  3:12 PM  Result Value Ref Range   WBC, Ur, HPF, POC 0-1    RBC, urine, microscopic 2-3    Bacteria, U Microscopic neg    Mucus, UA neg    Epithelial cells, urine per micros neg    Crystals, Ur, HPF, POC neg    Casts, Ur, LPF, POC neg    Yeast, UA neg   POCT CBC     Status: None   Collection Time: 12/07/14  3:12 PM  Result  Value Ref Range   WBC 6.8 4.6 - 10.2 K/uL   Lymph, poc 2.3 0.6 - 3.4   POC LYMPH PERCENT 33.1 10 - 50 %L   MID (cbc) 0.3 0 - 0.9   POC MID % 4.9 0 - 12 %M   POC Granulocyte 4.2 2 - 6.9   Granulocyte percent 62.0 37 - 80 %G   RBC 5.31 4.69 - 6.13 M/uL   Hemoglobin 15.0 14.1 - 18.1 g/dL   HCT, POC 46.6 43.5 - 53.7 %   MCV 87.7 80 - 97 fL   MCH, POC 28.2 27 - 31.2 pg   MCHC 32.1 31.8 - 35.4 g/dL   RDW, POC 13.1 %   Platelet Count, POC 241 142 - 424 K/uL   MPV 6.7 0 - 99.8 fL    ASSESSMENT & PLAN  Michael Church was seen today for follow-up.  Diagnoses and all orders for this visit:  Follow up  Lower abdominal pain: At follow up white count is improved and the patient feels roughly 50% better.  Urine now pointing in the direction of a stone.  Yesterdays radiographs unremarkable for this.   Orders: -     POCT urinalysis dipstick -     POCT UA - Microscopic Only -     POCT CBC  Nephrolithiasis: Patient advised to hold HCTZ for now and to start the below to increase urination. Advised that he come back in five days if he is not 100% better. He was advised to continue NSAID pain management, vigorous hydration, and opioid pain medication as needed.  Advised that if he develops intractable fever, intractable pain, and/or the inability to drink then to come back to [facility] or go to the nearest ED for evaluation and treatment. Orders: -     terazosin (HYTRIN) 2 MG capsule; Take 1-2 capsules (2-4 mg total) by mouth once.    The patient was advised to call or come back to clinic if he does not see an improvement in symptoms, or worsens with the above plan.   Philis Fendt, MHS, PA-C [facility] 12/07/2014 3:44 PM

## 2014-12-09 ENCOUNTER — Telehealth: Payer: Self-pay

## 2014-12-09 NOTE — Telephone Encounter (Signed)
Pt.notified

## 2014-12-09 NOTE — Telephone Encounter (Signed)
Return to Work note is in the pick up drawer.

## 2014-12-09 NOTE — Telephone Encounter (Signed)
Patient was seen by Renae Fickle for kidney stones. He needs a note to return to work since he has missed 3 days

## 2014-12-14 ENCOUNTER — Telehealth: Payer: Self-pay

## 2014-12-14 NOTE — Telephone Encounter (Signed)
error 

## 2014-12-29 ENCOUNTER — Other Ambulatory Visit (HOSPITAL_COMMUNITY): Payer: Self-pay | Admitting: Urology

## 2014-12-29 DIAGNOSIS — D49519 Neoplasm of unspecified behavior of unspecified kidney: Secondary | ICD-10-CM

## 2015-01-05 ENCOUNTER — Ambulatory Visit (HOSPITAL_COMMUNITY)
Admission: RE | Admit: 2015-01-05 | Discharge: 2015-01-05 | Disposition: A | Discharge status: Home / Self Care | Payer: PRIVATE HEALTH INSURANCE | Source: Ambulatory Visit | Attending: Urology | Admitting: Urology

## 2015-01-05 DIAGNOSIS — D49519 Neoplasm of unspecified behavior of unspecified kidney: Secondary | ICD-10-CM

## 2015-01-05 DIAGNOSIS — D495 Neoplasm of unspecified behavior of other genitourinary organs: Secondary | ICD-10-CM | POA: Diagnosis not present

## 2015-01-05 MED ORDER — GADOBENATE DIMEGLUMINE 529 MG/ML IV SOLN
20.0000 mL | Freq: Once | INTRAVENOUS | Status: AC | PRN
  Administered 2015-01-05: 20 mL via INTRAVENOUS

## 2015-01-11 ENCOUNTER — Encounter: Payer: Self-pay | Admitting: Emergency Medicine

## 2015-01-11 ENCOUNTER — Ambulatory Visit (INDEPENDENT_AMBULATORY_CARE_PROVIDER_SITE_OTHER): Payer: PRIVATE HEALTH INSURANCE | Admitting: Emergency Medicine

## 2015-01-11 VITALS — BP 124/75 | HR 75 | Temp 98.4°F | Resp 16 | Ht 71.0 in | Wt 241.4 lb

## 2015-01-11 DIAGNOSIS — N2889 Other specified disorders of kidney and ureter: Secondary | ICD-10-CM | POA: Diagnosis not present

## 2015-01-11 DIAGNOSIS — I10 Essential (primary) hypertension: Secondary | ICD-10-CM | POA: Diagnosis not present

## 2015-01-11 MED ORDER — HYDROCHLOROTHIAZIDE 12.5 MG PO CAPS: ORAL_CAPSULE | ORAL | Status: DC

## 2015-01-11 NOTE — Addendum Note (Signed)
Addended by: [name] A on: 01/11/2015 05:44 PM   Modules accepted: Level of Service

## 2015-01-11 NOTE — Progress Notes (Signed)
   Subjective:    Patient ID: Michael Church, male    DOB: 07/12/1956, 59 y.o.   MRN: [id number] This chart was scribed for Michael Queen, MD by Zola Button, Medical Scribe. This patient was seen in room 23 and the patient's care was started at 2:26 PM.   HPI HPI Comments: [name] is a 59 y.o. male with a hx of HTN who presents to the [facility] for a follow-up. Patient and his family have been doing well. He will see his urologist, Dr. Janice Norrie, again on 01/24/15. Patient had an MRI following an abnormality of the right kidney, seen on a CT scan. Dr. Janice Norrie will discuss the results with him at the next visit. He denies smoking.   Review of Systems  Constitutional: Negative for fever.       Objective:   Physical Exam CONSTITUTIONAL: Well developed/well nourished HEAD: Normocephalic/atraumatic EYES: EOM/PERRL ENMT: Mucous membranes moist NECK: supple no meningeal signs SPINE: entire spine nontender CV: S1/S2 noted, no murmurs/rubs/gallops noted LUNGS: Lungs are clear to auscultation bilaterally, no apparent distress ABDOMEN: soft, nontender, no rebound or guarding GU: no cva tenderness NEURO: Pt is awake/alert, moves all extremitiesx4 EXTREMITIES: pulses normal, full ROM SKIN: warm, color normal PSYCH: no abnormalities of mood noted        Assessment & Plan:  1. Essential hypertension  - hydrochlorothiazide (MICROZIDE) 12.5 MG capsule; Take one tablet daily  Dispense: 90 capsule; Refill: 3 2 Renal lesion patient has a follow-up appointment to see Dr. Janice Norrie regarding the abnormal area seen on MRI.  I personally performed the services described in this documentation, which was scribed in my presence. The recorded information has been reviewed and is accurate.  Michael Queen, MD  [facility]  01/11/2015 5:42 PM

## 2015-01-22 ENCOUNTER — Other Ambulatory Visit: Payer: Self-pay | Admitting: Emergency Medicine

## 2015-04-02 ENCOUNTER — Ambulatory Visit (INDEPENDENT_AMBULATORY_CARE_PROVIDER_SITE_OTHER): Payer: PRIVATE HEALTH INSURANCE | Admitting: Internal Medicine

## 2015-04-02 VITALS — BP 120/72 | HR 77 | Temp 97.7°F | Resp 17 | Ht 71.0 in | Wt 246.0 lb

## 2015-04-02 DIAGNOSIS — A6920 Lyme disease, unspecified: Secondary | ICD-10-CM | POA: Diagnosis not present

## 2015-04-02 MED ORDER — DOXYCYCLINE HYCLATE 100 MG PO TABS: 100.0000 mg | ORAL_TABLET | Freq: Two times a day (BID) | ORAL | Status: DC

## 2015-04-02 NOTE — Patient Instructions (Signed)
Watch for these symptoms and check back or call! Fatigue  Anorexia  Headache - Neck stiffness - Myalgias - Joint pain or swelling Arthralgias - Regional lymphadenopathy - Fever

## 2015-04-02 NOTE — Progress Notes (Addendum)
Subjective:  This chart was scribed for Michael Lin, MD by Moises Blood, Medical Scribe. This patient was seen in Room 5 and the patient's care was started at 11:28 AM.     Patient ID: Michael Church, male    DOB: Jun 10, 1956, 59 y.o.   MRN: [id number]  HPI Michael Church is a 59 y.o. male who presents to [facility] complaining of red bumps on both legs that started about 2 weeks ago. He notes having a tick bite on his left lower back about 2-3 weeks ago. Then he noticed a little itchy red bump on his left calf 2 weeks ago. Then, in the past week, he has noticed several pruritic spots on his right lat and post thigh. He's tried using hydrocortisone cream on the affected areas with relief but the itchiness does not resolve. He denies fever, soreness, Fatigue, myalgia, joint pain or swelling, loss of appetite, neck stiffness, or lymph node swelling.  He's not felt ill in any other way.   Patient Active Problem List   Diagnosis Date Noted  . Renal mass--with plans for repeat MRI in 36 months  01/11/2015  . Hyperglycemia 01/12/2014  . Hypertension 09/22/2012  . Varicose vein of leg 09/22/2012    Current outpatient prescriptions:  .  hydrochlorothiazide (MICROZIDE) 12.5 MG capsule, Take one tablet daily, Disp: 90 capsule, Rfl: 3 .  Multiple Vitamin (MULTIVITAMIN WITH MINERALS) TABS, Take 1 tablet by mouth daily., Disp: , Rfl:  .  ibuprofen (ADVIL,MOTRIN) 200 MG tablet, Take 600 mg by mouth every 6 (six) hours as needed for moderate pain., Disp: , Rfl:     Review of Systems  Constitutional: Negative for fever, chills, diaphoresis and fatigue.  HENT: Negative for rhinorrhea and sore throat.   Respiratory: Negative for cough and shortness of breath.   Gastrointestinal: Negative for nausea, vomiting, diarrhea and constipation.  Skin: Positive for rash (red bumps on legs). Negative for wound.       Objective:   Physical Exam  Constitutional: He is oriented to person, place, and time. He  appears well-developed and well-nourished. No distress.  HENT:  Head: Normocephalic and atraumatic.  Eyes: EOM are normal. Pupils are equal, round, and reactive to light.  Neck: Neck supple.  Cardiovascular: Normal rate.   Pulmonary/Chest: Effort normal. No respiratory distress.  Musculoskeletal: Normal range of motion. He exhibits no edema.  No joint swelling or tenderness  Lymphadenopathy:    He has no cervical adenopathy.  Neurological: He is alert and oriented to person, place, and time. No cranial nerve deficit.  Skin: Skin is warm and dry.     There is a tick bite granuloma above the left buttock with a very faint line of erythema as noted in the drawing The other lesions are erythematous papules without vesiculation or pus. They are firm but not tender.  Psychiatric: He has a normal mood and affect. His behavior is normal.  Nursing note and vitals reviewed.         Assessment & Plan:   problem #1 tick bite with the development of erythema marginatum(?  migrans)  The scattered erythematous lesions look more like flea bites and he acknowledges a flea exposure at home  however they could represent a form of disseminated Lyme disease that is just starting since none of these lesions  have developed into marginatum lesions   Plan--we should treat presumptively for early Lyme disease which should also cover STARS  Current recommendation would be to not test  for Lyme antibodies at this point  He is warned to look for the development of symptoms to suggest the development of a more chronic process Patient Instructions  Watch for these symptoms and check back or call! Fatigue  Anorexia  Headache - Neck stiffness - Myalgias - Joint pain or swelling Arthralgias - Regional lymphadenopathy - Fever    Meds ordered this encounter  Medications  . doxycycline (VIBRA-TABS) 100 MG tablet    Sig: Take 1 tablet (100 mg total) by mouth 2 (two) times daily.    Dispense:  30 tablet     Refill:  0     I have completed the patient encounter in its entirety as documented by the scribe, with editing by me where necessary. Janat Tabbert P. Laney Pastor, M.D.

## 2015-04-28 IMAGING — CR DG CHEST 2V
2 series · 2 of 2 positions shown · non-contrast
Comparison: [DATE] from [REDACTED].

CLINICAL DATA: Mid chest pain radiating to the left. Recent
varicose vein surgery [REDACTED].

EXAM:
CHEST  2 VIEW

[w chest pa]
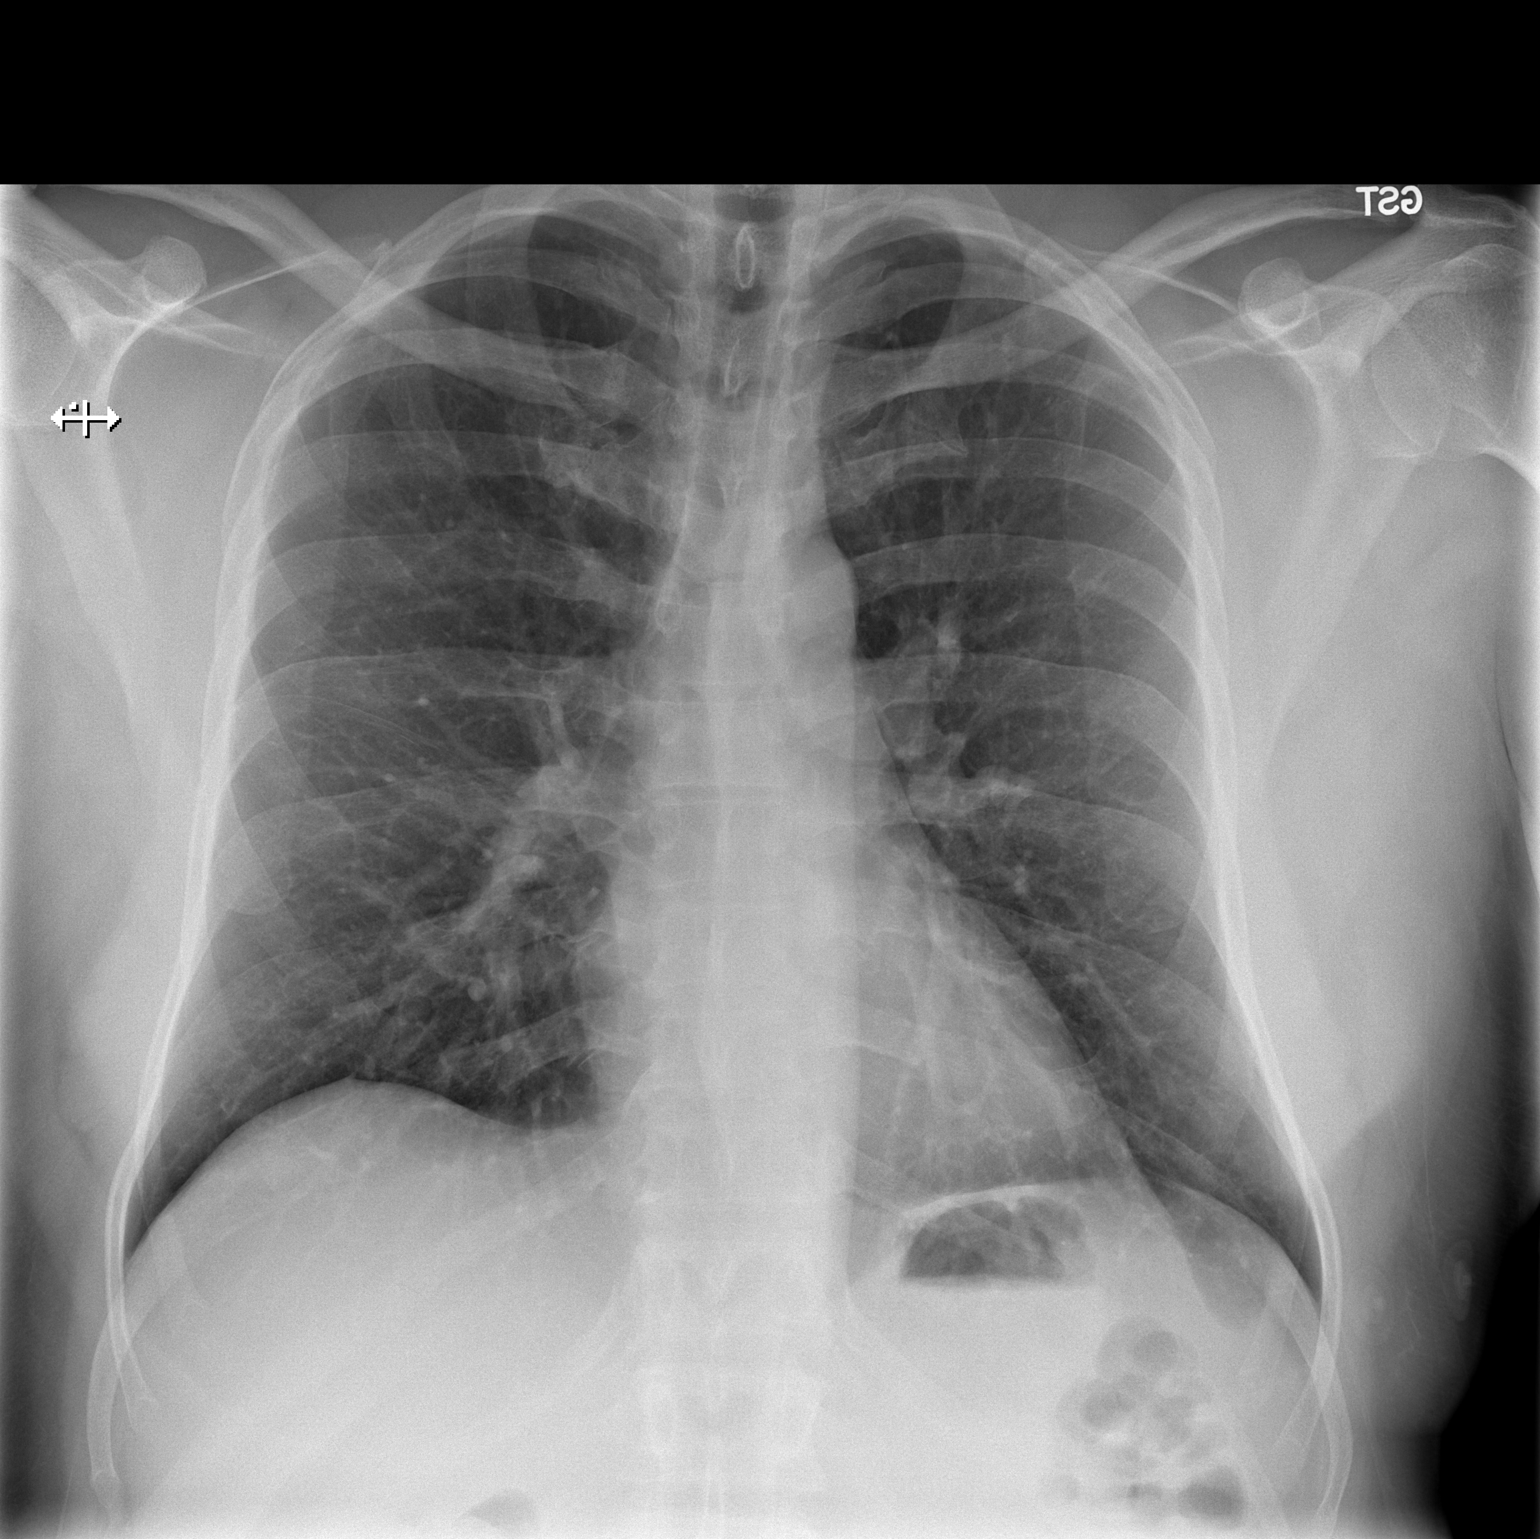

[w chest lat]
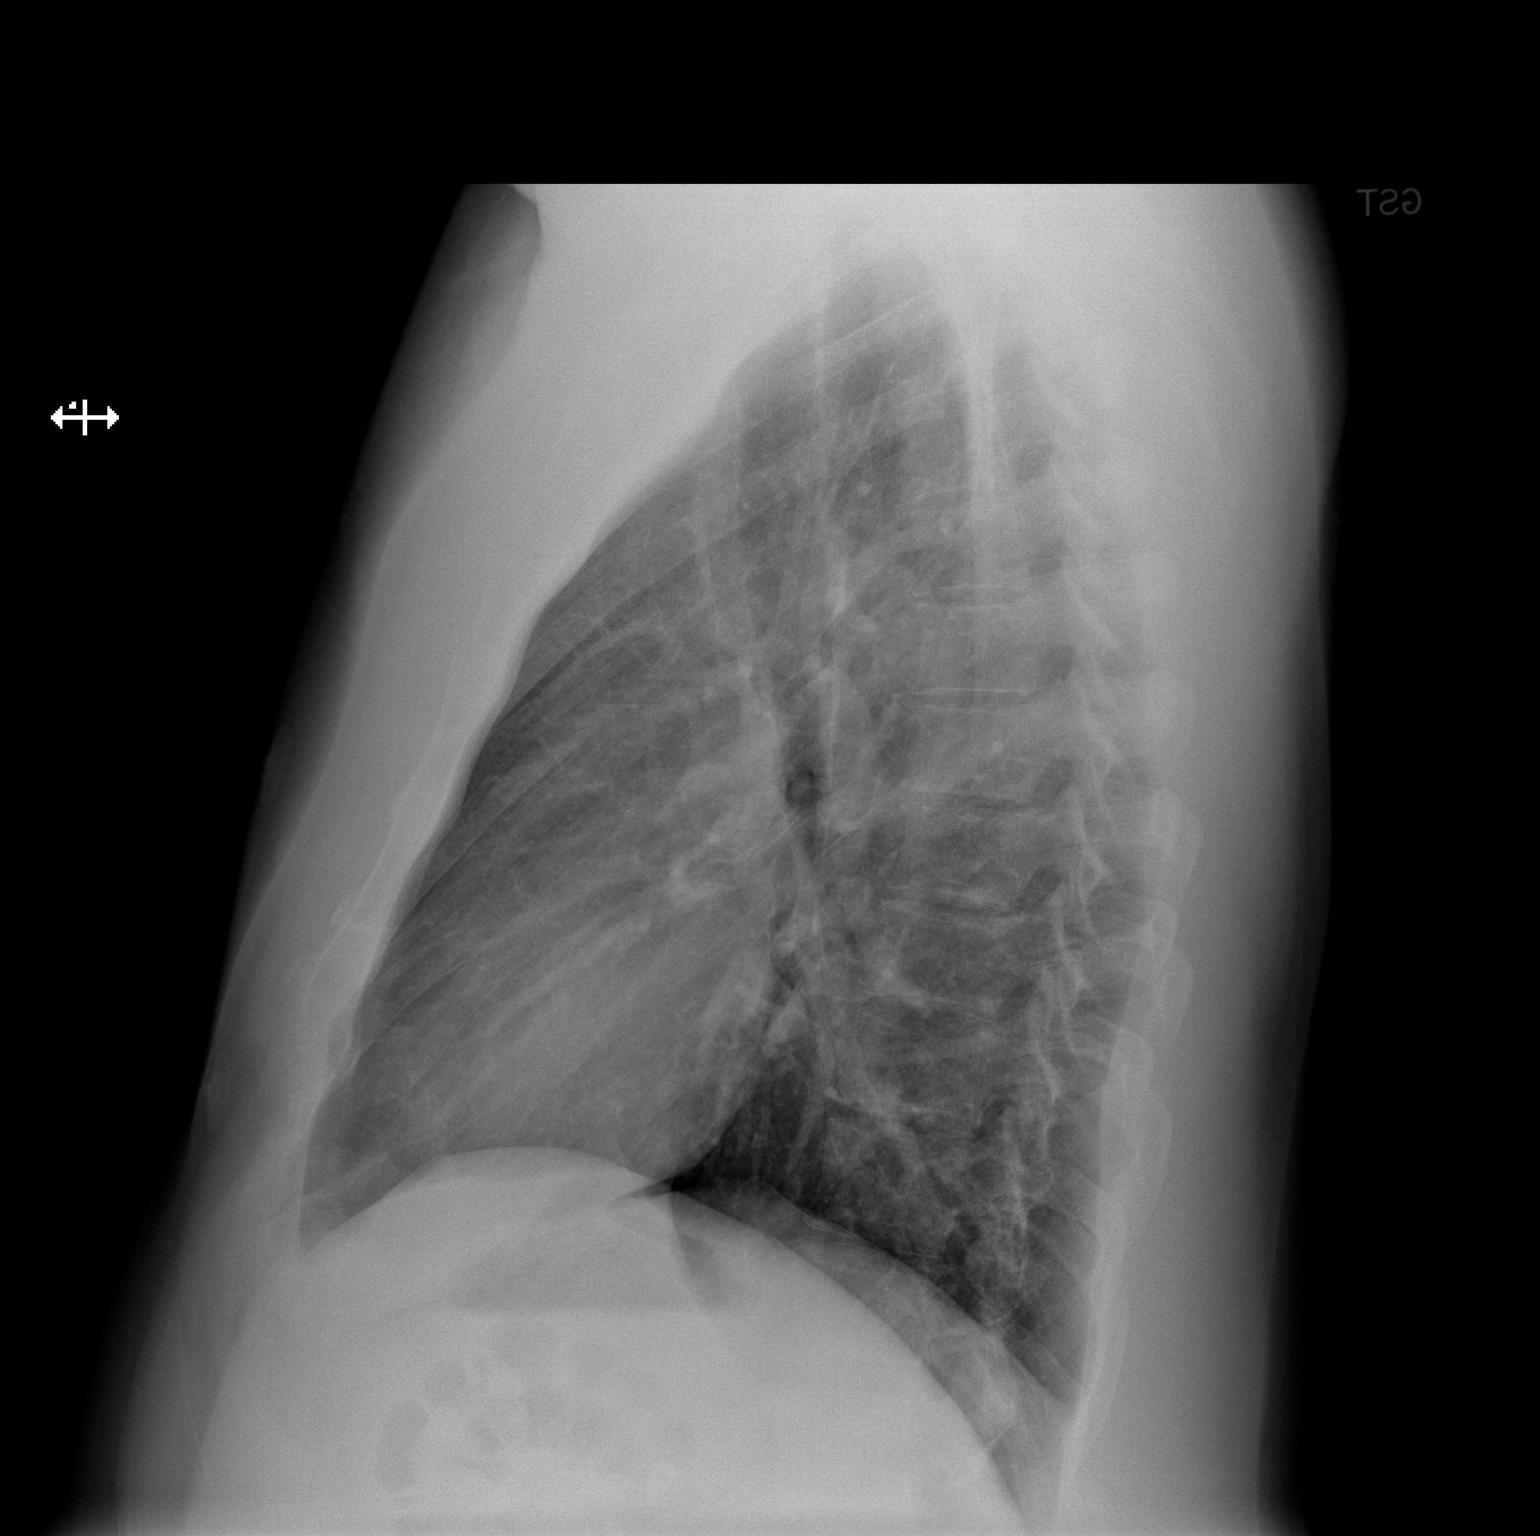

[2 of 2 positions shown; findings below may reference images not displayed]

FINDINGS: The heart size and mediastinal contours are within normal limits.
Both lungs are clear. The visualized skeletal structures are
unremarkable.
IMPRESSION: No active cardiopulmonary disease.

## 2015-05-02 ENCOUNTER — Encounter: Payer: Self-pay | Admitting: Gastroenterology

## 2015-06-28 ENCOUNTER — Encounter: Payer: Self-pay | Admitting: Emergency Medicine

## 2015-06-28 ENCOUNTER — Ambulatory Visit (INDEPENDENT_AMBULATORY_CARE_PROVIDER_SITE_OTHER): Payer: PRIVATE HEALTH INSURANCE | Admitting: Emergency Medicine

## 2015-06-28 ENCOUNTER — Other Ambulatory Visit: Payer: Self-pay | Admitting: Emergency Medicine

## 2015-06-28 VITALS — BP 135/85 | HR 71 | Temp 98.5°F | Resp 16 | Ht 71.0 in | Wt 250.0 lb

## 2015-06-28 DIAGNOSIS — Z0000 Encounter for general adult medical examination without abnormal findings: Secondary | ICD-10-CM

## 2015-06-28 DIAGNOSIS — Z13220 Encounter for screening for lipoid disorders: Secondary | ICD-10-CM

## 2015-06-28 DIAGNOSIS — I10 Essential (primary) hypertension: Secondary | ICD-10-CM

## 2015-06-28 DIAGNOSIS — E119 Type 2 diabetes mellitus without complications: Secondary | ICD-10-CM

## 2015-06-28 DIAGNOSIS — R739 Hyperglycemia, unspecified: Secondary | ICD-10-CM

## 2015-06-28 DIAGNOSIS — Z23 Encounter for immunization: Secondary | ICD-10-CM | POA: Diagnosis not present

## 2015-06-28 DIAGNOSIS — N2889 Other specified disorders of kidney and ureter: Secondary | ICD-10-CM

## 2015-06-28 LAB — COMPLETE METABOLIC PANEL WITH GFR
ALT: 25 U/L (ref 9–46)
AST: 22 U/L (ref 10–35)
Albumin: 4.4 g/dL (ref 3.6–5.1)
Alkaline Phosphatase: 47 U/L (ref 40–115)
BUN: 11 mg/dL (ref 7–25)
CHLORIDE: 99 mmol/L (ref 98–110)
CO2: 32 mmol/L — AB (ref 20–31)
CREATININE: 1.05 mg/dL (ref 0.70–1.33)
Calcium: 9.5 mg/dL (ref 8.6–10.3)
GFR, Est African American: >89 mL/min (ref >=60)
GFR, Est Non African American: 78 mL/min (ref >=60)
Glucose, Bld: 108 mg/dL — ABNORMAL HIGH (ref 65–99)
POTASSIUM: 4.6 mmol/L (ref 3.5–5.3)
SODIUM: 139 mmol/L (ref 135–146)
Total Bilirubin: 0.6 mg/dL (ref 0.2–1.2)
Total Protein: 7.1 g/dL (ref 6.1–8.1)

## 2015-06-28 LAB — POCT URINALYSIS DIP (MANUAL ENTRY)
Bilirubin, UA: NEGATIVE
GLUCOSE UA: NEGATIVE
Ketones, POC UA: NEGATIVE
Leukocytes, UA: NEGATIVE
NITRITE UA: NEGATIVE
PH UA: 5.5
Protein Ur, POC: NEGATIVE
SPEC GRAV UA: 1.025
Urobilinogen, UA: 0.2

## 2015-06-28 LAB — CBC WITH DIFFERENTIAL/PLATELET
BASOS ABS: 0 10*3/uL (ref 0.0–0.1)
BASOS PCT: 0 % (ref 0–1)
EOS ABS: 0 10*3/uL (ref 0.0–0.7)
EOS PCT: 1 % (ref 0–5)
HCT: 46.7 % (ref 39.0–52.0)
Hemoglobin: 16.2 g/dL (ref 13.0–17.0)
LYMPHS PCT: 42 % (ref 12–46)
Lymphs Abs: 1.6 10*3/uL (ref 0.7–4.0)
MCH: 28.9 pg (ref 26.0–34.0)
MCHC: 34.7 g/dL (ref 30.0–36.0)
MCV: 83.2 fL (ref 78.0–100.0)
MONO ABS: 0.2 10*3/uL (ref 0.1–1.0)
MPV: 9.6 fL (ref 8.6–12.4)
Monocytes Relative: 6 % (ref 3–12)
Neutro Abs: 2 10*3/uL (ref 1.7–7.7)
Neutrophils Relative %: 51 % (ref 43–77)
PLATELETS: 243 10*3/uL (ref 150–400)
RBC: 5.61 MIL/uL (ref 4.22–5.81)
RDW: 13.5 % (ref 11.5–15.5)
WBC: 3.9 10*3/uL — AB (ref 4.0–10.5)

## 2015-06-28 LAB — POC MICROSCOPIC URINALYSIS (UMFC): MUCUS RE: ABSENT

## 2015-06-28 LAB — LIPID PANEL
Cholesterol: 195 mg/dL (ref 125–200)
HDL: 60 mg/dL (ref >=40)
LDL CALC: 118 mg/dL (ref <130)
Total CHOL/HDL Ratio: 3.3 Ratio (ref <=5.0)
Triglycerides: 86 mg/dL (ref <150)
VLDL: 17 mg/dL (ref <30)

## 2015-06-28 LAB — POCT GLYCOSYLATED HEMOGLOBIN (HGB A1C): HEMOGLOBIN A1C: 6.4

## 2015-06-28 LAB — GLUCOSE, POCT (MANUAL RESULT ENTRY): POC Glucose: 108 mg/dl — AB (ref 70–99)

## 2015-06-28 MED ORDER — METFORMIN HCL 500 MG PO TABS: 500.0000 mg | ORAL_TABLET | Freq: Two times a day (BID) | ORAL | Status: DC

## 2015-06-28 NOTE — Progress Notes (Signed)
This chart was scribed for Michael Queen, MD by Michael Church, Medical Scribe. This patient was seen in Room 23 and the patient's care was started 9:38 AM.  Chief Complaint:  Chief Complaint  Patient presents with  . Annual Exam    HPI: Michael Church is a 59 y.o. male who reports to [facility] today for physical exam.  Kidney He had MRI in April, suggested repeated exam in 6 months to confirm stability. He had a mass and went to urology. He's going on Nov 10th for follow up because there was scar tissue on kidney.   Skin He notes of a small bump on his mid back and another on his lower back. They don't bother him but was concerned.   Weight and Exercise He's trying to lose weight again, but has been lazy. He's trying to go to the gym twice a week. He's been trying to go hunting for deer.   Work He retired June 1st, 2016. He's happy that he did retire. 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   Family His father (68 y.o) passed away from stroke.  His mother (87 y.o) passed away due to surgircal complication.  He has 2 siblings.   Immunization He received a flu shot today.   Past Medical History  Diagnosis Date  . Prehypertension   . Hypertension    Past Surgical History  Procedure Laterality Date  . Left knee torn meniscus  03/2011  . Cervical spine surgery  10/2011  . Varicose vein surgery     Social History   Social History  . Marital Status: Married    Spouse Name: N/A  . Number of Children: N/A  . Years of Education: N/A   Social History Main Topics  . Smoking status: Never Smoker   . Smokeless tobacco: Never Used  . Alcohol Use: 0.5 oz/week    1 Standard drinks or equivalent per week     Comment: OCCASIONALLY DRINK - BEER AND MIXED DRINKS  . Drug Use: No  . Sexual Activity: Yes   Other Topics Concern  . None   Social History Narrative   Family History  Problem Relation Age of Onset  . Stroke Father     age 31     Allergies  Allergen Reactions  . Other Other (See Comments)    Foods - cantalopes, tomatoes, and  raw carrots  . Penicillins Itching   Prior to Admission medications   Medication Sig Start Date End Date Taking? Authorizing Provider  doxycycline (VIBRA-TABS) 100 MG tablet Take 1 tablet (100 mg total) by mouth 2 (two) times daily. 04/02/15   Michael Koyanagi, MD  hydrochlorothiazide (MICROZIDE) 12.5 MG capsule Take one tablet daily 01/11/15   Michael Russian, MD  ibuprofen (ADVIL,MOTRIN) 200 MG tablet Take 600 mg by mouth every 6 (six) hours as needed for moderate pain.    Historical Provider, MD  Multiple Vitamin (MULTIVITAMIN WITH MINERALS) TABS Take 1 tablet by mouth daily.    Historical Provider, MD     ROS:  Constitutional: negative for chills, fever, night sweats, weight changes, or fatigue  HEENT: negative for vision changes, hearing loss, congestion, rhinorrhea, ST, epistaxis, or sinus pressure Cardiovascular: negative for chest pain or palpitations Respiratory: negative for hemoptysis, wheezing, shortness of breath, or cough Abdominal: negative for abdominal pain, nausea, vomiting, diarrhea, or constipation Dermatological: positive for cyst (back) Neurologic: negative for headache, dizziness, or syncope All  other systems reviewed and are otherwise negative with the exception to those above and in the HPI.  PHYSICAL EXAM: Filed Vitals:   06/28/15 0919  BP: 135/85  Pulse: 71  Temp: 98.5 F (36.9 C)  Resp: 16   Body mass index is 34.88 kg/(m^2).   General: Alert, no acute distress HEENT:  Normocephalic, atraumatic, oropharynx patent. Eye: Michael Church [facility] Cardiovascular:  Regular rate and rhythm, no rubs murmurs or gallops.  No Carotid bruits, radial pulse intact. No pedal edema.  Respiratory: Clear to auscultation bilaterally.  No wheezes, rales, or rhonchi.  No cyanosis, no use of accessory musculature Abdominal: No organomegaly, abdomen is soft and non-tender, positive  bowel sounds. No masses. Musculoskeletal: Gait intact. No edema, tenderness Skin: 2x3cm cystic type area just below right flank Neurologic: Facial musculature symmetric. Psychiatric: Patient acts appropriately throughout our interaction.  Lymphatic: No cervical or submandibular lymphadenopathy Genitourinary/Anorectal: No acute findings   LABS: Results for orders placed or performed in visit on 06/28/15  POCT urinalysis dipstick  Result Value Ref Range   Color, UA light yellow (A) yellow   Clarity, UA clear clear   Glucose, UA negative negative   Bilirubin, UA negative negative   Ketones, POC UA negative negative   Spec Grav, UA 1.025    Church, UA moderate (A) negative   pH, UA 5.5    Protein Ur, POC negative negative   Urobilinogen, UA 0.2    Nitrite, UA Negative Negative   Leukocytes, UA Negative Negative  POCT Microscopic Urinalysis ([facility])  Result Value Ref Range   WBC,UR,HPF,POC Few (A) None WBC/hpf   RBC,UR,HPF,POC None None RBC/hpf   Bacteria None None   Mucus Absent Absent   Epithelial Cells, UR Per Microscopy None None cells/hpf  POCT glucose (manual entry)  Result Value Ref Range   POC Glucose 108 (A) 70 - 99 mg/dl  POCT glycosylated hemoglobin (Hb A1C)  Result Value Ref Range   Hemoglobin A1C 6.4      EKG/XRAY:   Primary read interpreted by Dr. Everlene Church at [facility].   ASSESSMENT/PLAN:  Hemoglobin A1c up to 6.4. I did go ahead and start him on metformin 1 a day for the first 2 weeks then 1 twice a day. Repeat hemoglobin A1c in 4 months. He was given his flu shot today. He has a moderate amount of Church in his urine but has a urology follow-up with repeat imaging in the next month. By signing my name below, I, Michael Church, attest that this documentation has been prepared under the direction and in the presence of Michael Queen, MD. Electronically Signed: Moises Church, [place]. 06/28/2015 , 9:38 AM .  I personally performed the services described in this  documentation, which was scribed in my presence. The recorded information has been reviewed and is accurate.  Gross sideeffects, risk and benefits, and alternatives of medications d/w patient. Patient is aware that all medications have potential sideeffects and we are unable to predict every sideeffect or drug-drug interaction that may occur.  Michael Queen MD 06/28/2015 9:38 AM

## 2015-06-28 NOTE — Patient Instructions (Signed)

## 2015-06-29 LAB — PSA: PSA: 1.5 ng/mL (ref <=4.00)

## 2015-06-30 LAB — HIV ANTIBODY (ROUTINE TESTING W REFLEX): HIV: NONREACTIVE

## 2015-07-08 ENCOUNTER — Other Ambulatory Visit: Payer: Self-pay | Admitting: Urology

## 2015-07-08 DIAGNOSIS — D49519 Neoplasm of unspecified behavior of unspecified kidney: Secondary | ICD-10-CM

## 2015-07-14 ENCOUNTER — Encounter: Payer: PRIVATE HEALTH INSURANCE | Admitting: Emergency Medicine

## 2015-07-27 ENCOUNTER — Encounter (HOSPITAL_COMMUNITY): Payer: Self-pay

## 2015-07-27 ENCOUNTER — Ambulatory Visit (HOSPITAL_COMMUNITY)
Admission: RE | Admit: 2015-07-27 | Discharge: 2015-07-27 | Disposition: A | Discharge status: Home / Self Care | Payer: PRIVATE HEALTH INSURANCE | Source: Ambulatory Visit | Attending: Urology | Admitting: Urology

## 2015-07-27 DIAGNOSIS — D49519 Neoplasm of unspecified behavior of unspecified kidney: Secondary | ICD-10-CM

## 2015-07-27 DIAGNOSIS — D3001 Benign neoplasm of right kidney: Secondary | ICD-10-CM | POA: Diagnosis not present

## 2015-07-27 MED ORDER — GADOBENATE DIMEGLUMINE 529 MG/ML IV SOLN
20.0000 mL | Freq: Once | INTRAVENOUS | Status: AC | PRN
  Administered 2015-07-27: 20 mL via INTRAVENOUS

## 2015-09-18 HISTORY — PX: KNEE SURGERY: SHX244

## 2015-09-27 ENCOUNTER — Ambulatory Visit: Payer: PRIVATE HEALTH INSURANCE | Admitting: Emergency Medicine

## 2015-09-28 ENCOUNTER — Other Ambulatory Visit (INDEPENDENT_AMBULATORY_CARE_PROVIDER_SITE_OTHER): Payer: No Typology Code available for payment source | Admitting: *Deleted

## 2015-09-28 DIAGNOSIS — E119 Type 2 diabetes mellitus without complications: Secondary | ICD-10-CM

## 2015-09-28 DIAGNOSIS — R739 Hyperglycemia, unspecified: Secondary | ICD-10-CM

## 2015-09-28 LAB — POCT GLYCOSYLATED HEMOGLOBIN (HGB A1C): Hemoglobin A1C: 6.3

## 2015-09-28 LAB — GLUCOSE, POCT (MANUAL RESULT ENTRY): POC Glucose: 117 mg/dl — AB (ref 70–99)

## 2015-09-29 LAB — MICROALBUMIN, URINE: Microalb, Ur: 0.4 mg/dL

## 2015-12-01 ENCOUNTER — Ambulatory Visit (INDEPENDENT_AMBULATORY_CARE_PROVIDER_SITE_OTHER): Payer: No Typology Code available for payment source | Admitting: Urgent Care

## 2015-12-01 VITALS — BP 126/88 | HR 82 | Temp 98.3°F | Resp 16 | Ht 72.0 in | Wt 240.0 lb

## 2015-12-01 DIAGNOSIS — J0100 Acute maxillary sinusitis, unspecified: Secondary | ICD-10-CM | POA: Diagnosis not present

## 2015-12-01 DIAGNOSIS — R7303 Prediabetes: Secondary | ICD-10-CM

## 2015-12-01 DIAGNOSIS — J3489 Other specified disorders of nose and nasal sinuses: Secondary | ICD-10-CM

## 2015-12-01 DIAGNOSIS — Z9109 Other allergy status, other than to drugs and biological substances: Secondary | ICD-10-CM

## 2015-12-01 DIAGNOSIS — Z91048 Other nonmedicinal substance allergy status: Secondary | ICD-10-CM

## 2015-12-01 MED ORDER — AMOXICILLIN-POT CLAVULANATE 875-125 MG PO TABS: 1.0000 | ORAL_TABLET | Freq: Two times a day (BID) | ORAL | Status: DC

## 2015-12-01 NOTE — Patient Instructions (Addendum)
   IF you received an x-ray today, you will receive an invoice from [facility]. Please contact [facility] at [phone] with questions or concerns regarding your invoice.   IF you received labwork today, you will receive an invoice from [facility]. Please contact [facility] at [phone] with questions or concerns regarding your invoice.   Our billing staff will not be able to assist you with questions regarding bills from these companies.  You will be contacted with the lab results as soon as they are available. The fastest way to get your results is to activate your My Chart account. Instructions are located on the last page of this paperwork. If you have not heard from us regarding the results in 2 weeks, please contact this office.     Sinusitis, Adult Sinusitis is redness, soreness, and inflammation of the paranasal sinuses. Paranasal sinuses are air pockets within the bones of your face. They are located beneath your eyes, in the middle of your forehead, and above your eyes. In healthy paranasal sinuses, mucus is able to drain out, and air is able to circulate through them by way of your nose. However, when your paranasal sinuses are inflamed, mucus and air can become trapped. This can allow bacteria and other germs to grow and cause infection. Sinusitis can develop quickly and last only a short time (acute) or continue over a long period (chronic). Sinusitis that lasts for more than 12 weeks is considered chronic. CAUSES Causes of sinusitis include:  Allergies.  Structural abnormalities, such as displacement of the cartilage that separates your nostrils (deviated septum), which can decrease the air flow through your nose and sinuses and affect sinus drainage.  Functional abnormalities, such as when the small hairs (cilia) that line your sinuses and help remove mucus do not work properly or are not present. SIGNS AND SYMPTOMS Symptoms  of acute and chronic sinusitis are the same. The primary symptoms are pain and pressure around the affected sinuses. Other symptoms include:  Upper toothache.  Earache.  Headache.  Bad breath.  Decreased sense of smell and taste.  A cough, which worsens when you are lying flat.  Fatigue.  Fever.  Thick drainage from your nose, which often is green and may contain pus (purulent).  Swelling and warmth over the affected sinuses. DIAGNOSIS Your health care provider will perform a physical exam. During your exam, your health care provider may perform any of the following to help determine if you have acute sinusitis or chronic sinusitis:  Look in your nose for signs of abnormal growths in your nostrils (nasal polyps).  Tap over the affected sinus to check for signs of infection.  View the inside of your sinuses using an imaging device that has a light attached (endoscope). If your health care provider suspects that you have chronic sinusitis, one or more of the following tests may be recommended:  Allergy tests.  Nasal culture. A sample of mucus is taken from your nose, sent to a lab, and screened for bacteria.  Nasal cytology. A sample of mucus is taken from your nose and examined by your health care provider to determine if your sinusitis is related to an allergy. TREATMENT Most cases of acute sinusitis are related to a viral infection and will resolve on their own within 10 days. Sometimes, medicines are prescribed to help relieve symptoms of both acute and chronic sinusitis. These may include pain medicines, decongestants, nasal steroid sprays, or saline sprays. However, for sinusitis related   to a bacterial infection, your health care provider will prescribe antibiotic medicines. These are medicines that will help kill the bacteria causing the infection. Rarely, sinusitis is caused by a fungal infection. In these cases, your health care provider will prescribe antifungal  medicine. For some cases of chronic sinusitis, surgery is needed. Generally, these are cases in which sinusitis recurs more than 3 times per year, despite other treatments. HOME CARE INSTRUCTIONS  Drink plenty of water. Water helps thin the mucus so your sinuses can drain more easily.  Use a humidifier.  Inhale steam 3-4 times a day (for example, sit in the bathroom with the shower running).  Apply a warm, moist washcloth to your face 3-4 times a day, or as directed by your health care provider.  Use saline nasal sprays to help moisten and clean your sinuses.  Take medicines only as directed by your health care provider.  If you were prescribed either an antibiotic or antifungal medicine, finish it all even if you start to feel better. SEEK IMMEDIATE MEDICAL CARE IF:  You have increasing pain or severe headaches.  You have nausea, vomiting, or drowsiness.  You have swelling around your face.  You have vision problems.  You have a stiff neck.  You have difficulty breathing.   This information is not intended to replace advice given to you by your health care provider. Make sure you discuss any questions you have with your health care provider.   Document Released: 09/03/2005 Document Revised: 09/24/2014 Document Reviewed: 09/18/2011 Elsevier Interactive Patient Education 2016 Elsevier Inc.  

## 2015-12-01 NOTE — Progress Notes (Signed)
    MRN: [id number] DOB: 1956-09-08  Subjective:   Michael Church is a 60 y.o. male presenting for chief complaint of Sinus Problem  Reports ~3 week history of persistent sinus congestion, now having left sided sinus pain, left upper tooth pain, sore throat, mild cough. Has tried NyQuil, nasal saline. Denies fever, ear pain, ear drainage. Admits history of seasonal allergies, takes allergy shots for this once a month. Take oral antihistamine daily. Denies smoking cigarettes. Reports that he had an allergic reaction to penicillin a long time ago. However, he has used amoxicillin without a problem.  Michael Church has a current medication list which includes the following prescription(s): hydrochlorothiazide, ibuprofen, metformin, and multivitamin with minerals. Also is allergic to other and penicillins.  Michael Church  has a past medical history of Prehypertension and Hypertension. Also  has past surgical history that includes left knee torn meniscus (03/2011); Cervical spine surgery (10/2011); and Varicose vein surgery.  Objective:   Vitals: BP 126/88 mmHg  Pulse 82  Temp(Src) 98.3 F (36.8 C) (Oral)  Resp 16  Ht 6' (1.829 m)  Wt 240 lb (108.863 kg)  BMI 32.54 kg/m2  SpO2 97%  Physical Exam  Constitutional: He is oriented to person, place, and time. He appears well-developed and well-nourished.  HENT:  TM's intact bilaterally, no effusions or erythema. Nasal turbinates erythematous with thick white mucus, left sided maxillary tenderness. Oropharynx clear, mucous membranes moist, dentition in good repair.  Eyes: Right eye exhibits no discharge. Left eye exhibits no discharge. No scleral icterus.  Neck: Normal range of motion. Neck supple.  Cardiovascular: Normal rate, regular rhythm and intact distal pulses.  Exam reveals no gallop and no friction rub.   No murmur heard. Pulmonary/Chest: No respiratory distress. He has no wheezes. He has no rales.  Lymphadenopathy:    He has no cervical adenopathy.    Neurological: He is alert and oriented to person, place, and time.    Assessment and Plan :   1. Acute maxillary sinusitis, recurrence not specified 2. Sinus pain - Start Augmentin, counseled on signs of allergic reaction. Patient is to stop Augmentin immediately if this problem develops. RTC at that point.  3. Environmental allergies - Continue allergy management.  4. Pre-diabetes - Patient came in for labs only in January 2017. Asked for review of his A1c, reported his level of 6.3. Counseled on lifestyle modifications, f/u with Dr. Everlene Farrier at his next appt 12/2015.  Michael Eagles, PA-C [facility] [phone] 12/01/2015 9:01 AM

## 2015-12-14 ENCOUNTER — Other Ambulatory Visit: Payer: Self-pay | Admitting: Urology

## 2015-12-14 DIAGNOSIS — D3001 Benign neoplasm of right kidney: Secondary | ICD-10-CM

## 2015-12-20 LAB — HM DIABETES EYE EXAM

## 2016-01-10 ENCOUNTER — Ambulatory Visit (HOSPITAL_COMMUNITY): Payer: Self-pay

## 2016-01-12 ENCOUNTER — Encounter: Payer: Self-pay | Admitting: Emergency Medicine

## 2016-01-12 ENCOUNTER — Ambulatory Visit (INDEPENDENT_AMBULATORY_CARE_PROVIDER_SITE_OTHER): Payer: No Typology Code available for payment source | Admitting: Emergency Medicine

## 2016-01-12 VITALS — BP 110/72 | HR 71 | Temp 98.1°F | Resp 16 | Ht 71.0 in | Wt 241.8 lb

## 2016-01-12 DIAGNOSIS — I10 Essential (primary) hypertension: Secondary | ICD-10-CM | POA: Diagnosis not present

## 2016-01-12 DIAGNOSIS — E119 Type 2 diabetes mellitus without complications: Secondary | ICD-10-CM | POA: Diagnosis not present

## 2016-01-12 DIAGNOSIS — Z23 Encounter for immunization: Secondary | ICD-10-CM | POA: Diagnosis not present

## 2016-01-12 LAB — BASIC METABOLIC PANEL WITH GFR
BUN: 15 mg/dL (ref 7–25)
CALCIUM: 9.2 mg/dL (ref 8.6–10.3)
CO2: 26 mmol/L (ref 20–31)
CREATININE: 1.09 mg/dL (ref 0.70–1.33)
Chloride: 102 mmol/L (ref 98–110)
GFR, EST AFRICAN AMERICAN: 87 mL/min (ref >=60)
GFR, EST NON AFRICAN AMERICAN: 75 mL/min (ref >=60)
GLUCOSE: 98 mg/dL (ref 65–99)
Potassium: 4.3 mmol/L (ref 3.5–5.3)
Sodium: 139 mmol/L (ref 135–146)

## 2016-01-12 LAB — GLUCOSE, POCT (MANUAL RESULT ENTRY): POC GLUCOSE: 114 mg/dL — AB (ref 70–99)

## 2016-01-12 LAB — POCT GLYCOSYLATED HEMOGLOBIN (HGB A1C): Hemoglobin A1C: 6.2

## 2016-01-12 NOTE — Progress Notes (Signed)
By signing my name below, I, Michael Church, attest that this documentation has been prepared under the direction and in the presence of Michael Queen, MD.  Electronically Signed: Verlee Monte, Medical Scribe. 01/12/2016. 8:06 AM.  Chief Complaint:  Chief Complaint  Patient presents with  . Follow-up    DIABETES    HPI: Michael Church is a 60 y.o. male who reports to [facility] today for a follow up for his DM. He believes his sugars are well managed. He reports his weight has been fluctuating between 230 - 240 lbs, and plans on getting it to 220 lbs. He reports exercising in the gym and walking. He reports drinking a lot of water.  Kidney: Pt said he's supposed to see the nephrologist on the 5th. He reports already getting an MRI and waiting for the results.    Past Medical History  Diagnosis Date  . Prehypertension   . Hypertension    Past Surgical History  Procedure Laterality Date  . Left knee torn meniscus  03/2011  . Cervical spine surgery  10/2011  . Varicose vein surgery     Social History   Social History  . Marital Status: Married    Spouse Name: N/A  . Number of Children: N/A  . Years of Education: N/A   Social History Main Topics  . Smoking status: Never Smoker   . Smokeless tobacco: Never Used  . Alcohol Use: 0.5 oz/week    1 Standard drinks or equivalent per week     Comment: OCCASIONALLY DRINK - BEER AND MIXED DRINKS  . Drug Use: No  . Sexual Activity: Yes   Other Topics Concern  . None   Social History Narrative   Family History  Problem Relation Age of Onset  . Stroke Father     age 34   Allergies  Allergen Reactions  . Other Other (See Comments)    Foods - cantalopes, tomatoes, and  raw carrots  . Penicillins Itching   Prior to Admission medications   Medication Sig Start Date End Date Taking? Authorizing Provider  hydrochlorothiazide (MICROZIDE) 12.5 MG capsule Take one tablet daily 01/11/15  Yes Darlyne Russian, MD  metFORMIN  (GLUCOPHAGE) 500 MG tablet Take 1 tablet (500 mg total) by mouth 2 (two) times daily with a meal. 06/28/15  Yes Darlyne Russian, MD  Multiple Vitamin (MULTIVITAMIN WITH MINERALS) TABS Take 1 tablet by mouth daily.   Yes Historical Provider, MD     ROS: The patient denies fevers, chills, night sweats, unintentional weight loss, chest pain, palpitations, wheezing, dyspnea on exertion, nausea, vomiting, abdominal pain, dysuria, hematuria, melena, numbness, weakness, or tingling.   All other systems have been reviewed and were otherwise negative with the exception of those mentioned in the HPI and as above.    PHYSICAL EXAM: Filed Vitals:   01/12/16 0802  BP: 110/72  Pulse: 71  Temp: 98.1 F (36.7 C)  Resp: 16   Body mass index is 33.74 kg/(m^2).   General: Alert, no acute distress HEENT:  Normocephalic, atraumatic, oropharynx patent. Eye: [name] [facility] Cardiovascular:  Regular rate and rhythm, no rubs murmurs or gallops.  No Carotid bruits, radial pulse intact. No pedal edema.  Respiratory: Clear to auscultation bilaterally.  No wheezes, rales, or rhonchi.  No cyanosis, no use of accessory musculature Abdominal: No organomegaly, abdomen is soft and non-tender, positive bowel sounds.  No masses. Musculoskeletal: Gait intact. No edema, tenderness Skin: No rashes. Neurologic: Facial musculature symmetric. Psychiatric: Patient  acts appropriately throughout our interaction. Lymphatic: No cervical or submandibular lymphadenopathy  LABS: Results for orders placed or performed in visit on 01/12/16  POCT glucose (manual entry)  Result Value Ref Range   POC Glucose 114 (A) 70 - 99 mg/dl  POCT glycosylated hemoglobin (Hb A1C)  Result Value Ref Range   Hemoglobin A1C 6.2    EKG/XRAY:   Primary read interpreted by Dr. Everlene Farrier at [facility].   ASSESSMENT/PLAN: Patient doing well. Last A1c was 6.3 this A1c is 6.2. He was given 23 vaccine. I would advise him if he can tolerate it to take a baby  aspirin a day because he does have multiple cardiac risk factors. There will be no change in his medications. I told him to try and work on a 20 pound weight loss over the next 6 months. He will do this with exercise and diet.   Gross sideeffects, risk and benefits, and alternatives of medications d/w patient. Patient is aware that all medications have potential sideeffects and we are unable to predict every sideeffect or drug-drug interaction that may occur.  Michael Queen MD 01/12/2016 8:05 AM

## 2016-01-12 NOTE — Patient Instructions (Addendum)
   IF you received an x-ray today, you will receive an invoice from [facility]. Please contact [facility] at [phone] with questions or concerns regarding your invoice.   IF you received labwork today, you will receive an invoice from [facility]. Please contact [facility] at [phone] with questions or concerns regarding your invoice.   Our billing staff will not be able to assist you with questions regarding bills from these companies.  You will be contacted with the lab results as soon as they are available. The fastest way to get your results is to activate your My Chart account. Instructions are located on the last page of this paperwork. If you have not heard from us regarding the results in 2 weeks, please contact this office.    Health Maintenance, Male A healthy lifestyle and preventative care can promote health and wellness.  Maintain regular health, dental, and eye exams.  Eat a healthy diet. Foods like vegetables, fruits, whole grains, low-fat dairy products, and lean protein foods contain the nutrients you need and are low in calories. Decrease your intake of foods high in solid fats, added sugars, and salt. Get information about a proper diet from your health care provider, if necessary.  Regular physical exercise is one of the most important things you can do for your health. Most adults should get at least 150 minutes of moderate-intensity exercise (any activity that increases your heart rate and causes you to sweat) each week. In addition, most adults need muscle-strengthening exercises on 2 or more days a week.   Maintain a healthy weight. The body mass index (BMI) is a screening tool to identify possible weight problems. It provides an estimate of body fat based on height and weight. Your health care provider can find your BMI and can help you achieve or maintain a healthy weight. For males 20 years and older:  A BMI  below 18.5 is considered underweight.  A BMI of 18.5 to 24.9 is normal.  A BMI of 25 to 29.9 is considered overweight.  A BMI of 30 and above is considered obese.  Maintain normal blood lipids and cholesterol by exercising and minimizing your intake of saturated fat. Eat a balanced diet with plenty of fruits and vegetables. Blood tests for lipids and cholesterol should begin at age 20 and be repeated every 5 years. If your lipid or cholesterol levels are high, you are over age 50, or you are at high risk for heart disease, you may need your cholesterol levels checked more frequently.Ongoing high lipid and cholesterol levels should be treated with medicines if diet and exercise are not working.  If you smoke, find out from your health care provider how to quit. If you do not use tobacco, do not start.  Lung cancer screening is recommended for adults aged 55-80 years who are at high risk for developing lung cancer because of a history of smoking. A yearly low-dose CT scan of the lungs is recommended for people who have at least a 30-pack-year history of smoking and are current smokers or have quit within the past 15 years. A pack year of smoking is smoking an average of 1 pack of cigarettes a day for 1 year (for example, a 30-pack-year history of smoking could mean smoking 1 pack a day for 30 years or 2 packs a day for 15 years). Yearly screening should continue until the smoker has stopped smoking for at least 15 years. Yearly screening should be stopped   for people who develop a health problem that would prevent them from having lung cancer treatment.  If you choose to drink alcohol, do not have more than 2 drinks per day. One drink is considered to be 12 oz (360 mL) of beer, 5 oz (150 mL) of wine, or 1.5 oz (45 mL) of liquor.  Avoid the use of street drugs. Do not share needles with anyone. Ask for help if you need support or instructions about stopping the use of drugs.  High blood pressure  causes heart disease and increases the risk of stroke. High blood pressure is more likely to develop in:  People who have blood pressure in the end of the normal range (100-139/85-89 mm Hg).  People who are overweight or obese.  People who are African American.  If you are 18-39 years of age, have your blood pressure checked every 3-5 years. If you are 40 years of age or older, have your blood pressure checked every year. You should have your blood pressure measured twice--once when you are at a hospital or clinic, and once when you are not at a hospital or clinic. Record the average of the two measurements. To check your blood pressure when you are not at a hospital or clinic, you can use:  An automated blood pressure machine at a pharmacy.  A home blood pressure monitor.  If you are 45-79 years old, ask your health care provider if you should take aspirin to prevent heart disease.  Diabetes screening involves taking a blood sample to check your fasting blood sugar level. This should be done once every 3 years after age 45 if you are at a normal weight and without risk factors for diabetes. Testing should be considered at a younger age or be carried out more frequently if you are overweight and have at least 1 risk factor for diabetes.  Colorectal cancer can be detected and often prevented. Most routine colorectal cancer screening begins at the age of 50 and continues through age 75. However, your health care provider may recommend screening at an earlier age if you have risk factors for colon cancer. On a yearly basis, your health care provider may provide home test kits to check for hidden blood in the stool. A small camera at the end of a tube may be used to directly examine the colon (sigmoidoscopy or colonoscopy) to detect the earliest forms of colorectal cancer. Talk to your health care provider about this at age 50 when routine screening begins. A direct exam of the colon should be repeated  every 5-10 years through age 75, unless early forms of precancerous polyps or small growths are found.  People who are at an increased risk for hepatitis B should be screened for this virus. You are considered at high risk for hepatitis B if:  You were born in a country where hepatitis B occurs often. Talk with your health care provider about which countries are considered high risk.  Your parents were born in a high-risk country and you have not received a shot to protect against hepatitis B (hepatitis B vaccine).  You have HIV or AIDS.  You use needles to inject street drugs.  You live with, or have sex with, someone who has hepatitis B.  You are a man who has sex with other men (MSM).  You get hemodialysis treatment.  You take certain medicines for conditions like cancer, organ transplantation, and autoimmune conditions.  Hepatitis C blood testing is recommended for   all people born from 1945 through 1965 and any individual with known risk factors for hepatitis C.  Healthy men should no longer receive prostate-specific antigen (PSA) blood tests as part of routine cancer screening. Talk to your health care provider about prostate cancer screening.  Testicular cancer screening is not recommended for adolescents or adult males who have no symptoms. Screening includes self-exam, a health care provider exam, and other screening tests. Consult with your health care provider about any symptoms you have or any concerns you have about testicular cancer.  Practice safe sex. Use condoms and avoid high-risk sexual practices to reduce the spread of sexually transmitted infections (STIs).  You should be screened for STIs, including gonorrhea and chlamydia if:  You are sexually active and are younger than 24 years.  You are older than 24 years, and your health care provider tells you that you are at risk for this type of infection.  Your sexual activity has changed since you were last screened,  and you are at an increased risk for chlamydia or gonorrhea. Ask your health care provider if you are at risk.  If you are at risk of being infected with HIV, it is recommended that you take a prescription medicine daily to prevent HIV infection. This is called pre-exposure prophylaxis (PrEP). You are considered at risk if:  You are a man who has sex with other men (MSM).  You are a heterosexual man who is sexually active with multiple partners.  You take drugs by injection.  You are sexually active with a partner who has HIV.  Talk with your health care provider about whether you are at high risk of being infected with HIV. If you choose to begin PrEP, you should first be tested for HIV. You should then be tested every 3 months for as long as you are taking PrEP.  Use sunscreen. Apply sunscreen liberally and repeatedly throughout the day. You should seek shade when your shadow is shorter than you. Protect yourself by wearing long sleeves, pants, a wide-brimmed hat, and sunglasses year round whenever you are outdoors.  Tell your health care provider of new moles or changes in moles, especially if there is a change in shape or color. Also, tell your health care provider if a mole is larger than the size of a pencil eraser.  A one-time screening for abdominal aortic aneurysm (AAA) and surgical repair of large AAAs by ultrasound is recommended for men aged 65-75 years who are current or former smokers.  Stay current with your vaccines (immunizations).   This information is not intended to replace advice given to you by your health care provider. Make sure you discuss any questions you have with your health care provider.   Document Released: 03/01/2008 Document Revised: 09/24/2014 Document Reviewed: 01/29/2011 Elsevier Interactive Patient Education 2016 Elsevier Inc.  

## 2016-01-15 ENCOUNTER — Encounter: Payer: Self-pay | Admitting: *Deleted

## 2016-03-31 ENCOUNTER — Other Ambulatory Visit: Payer: Self-pay | Admitting: Emergency Medicine

## 2016-06-14 ENCOUNTER — Other Ambulatory Visit: Payer: Self-pay | Admitting: Emergency Medicine

## 2016-06-15 NOTE — Telephone Encounter (Signed)
Pt needs OV prior to running out of Metformin. Dr. Everlene Farrier has retired. Another provider at [facility] will be glad to see him.

## 2016-06-29 ENCOUNTER — Other Ambulatory Visit: Payer: Self-pay

## 2016-06-29 MED ORDER — HYDROCHLOROTHIAZIDE 12.5 MG PO CAPS: 12.5000 mg | ORAL_CAPSULE | Freq: Every day | ORAL | 0 refills | Status: DC

## 2016-07-21 ENCOUNTER — Other Ambulatory Visit: Payer: Self-pay | Admitting: Family Medicine

## 2016-07-31 ENCOUNTER — Other Ambulatory Visit: Payer: Self-pay | Admitting: Urgent Care

## 2016-08-17 DIAGNOSIS — Z85528 Personal history of other malignant neoplasm of kidney: Secondary | ICD-10-CM

## 2016-08-17 HISTORY — DX: Personal history of other malignant neoplasm of kidney: Z85.528

## 2016-08-24 ENCOUNTER — Other Ambulatory Visit: Payer: Self-pay | Admitting: Urology

## 2016-08-24 ENCOUNTER — Telehealth: Payer: Self-pay

## 2016-08-24 NOTE — Patient Instructions (Addendum)
KAYLIN KURTIS  08/24/2016   Your procedure is scheduled on: 09-07-16  Report to [facility] Main  Entrance take [facility]  elevators to 3rd floor to  [place] at   [place] AM.  Call this number if you have problems the morning of surgery [phone]   Remember: ONLY 1 PERSON MAY GO WITH YOU TO SHORT STAY TO GET  READY MORNING OF YOUR SURGERY.  Do not eat food or drink liquids :After Midnight.   Follow Bowel prep instructions per MD office. (Drink Clear Liquids Plentiful day of before).  Take these medicines the morning of surgery with A SIP OF WATER: none DO NOT TAKE ANY DIABETIC MEDICATIONS DAY OF YOUR SURGERY                               You may not have any metal on your body including hair pins and              piercings  Do not wear jewelry, make-up, lotions, powders or perfumes, deodorant             Do not wear nail polish.  Do not shave  48 hours prior to surgery.              Men may shave face and neck.   Do not bring valuables to the hospital. [place].  Contacts, dentures or bridgework may not be worn into surgery.  Leave suitcase in the car. After surgery it may be brought to your room.     Patients discharged the day of surgery will not be allowed to drive home.  Name and phone number of your driver:Andrea-spouse [phone] h / cell [phone]   Special Instructions: N/A              Please read over the following fact sheets you were given: _____________________________________________________________________  How to Manage Your Diabetes Before and After Surgery  Why is it important to control my blood sugar before and after surgery? . Improving blood sugar levels before and after surgery helps healing and can limit problems. . A way of improving blood sugar control is eating a healthy diet by: o  Eating less sugar and carbohydrates o  Increasing activity/exercise o  Talking with  your doctor about reaching your blood sugar goals . High blood sugars (greater than 180 mg/dL) can raise your risk of infections and slow your recovery, so you will need to focus on controlling your diabetes during the weeks before surgery. . Make sure that the doctor who takes care of your diabetes knows about your planned surgery including the date and location.  How do I manage my blood sugar before surgery? . Check your blood sugar at least 4 times a day, starting 2 days before surgery, to make sure that the level is not too high or low. o Check your blood sugar the morning of your surgery when you wake up and every 2 hours until you get to the Short Stay unit. . If your blood sugar is less than 70 mg/dL, you will need to treat for low blood sugar: o Do not take insulin. o Treat a low blood sugar (less than 70 mg/dL) with  cup of  clear juice (cranberry or apple), 4 glucose tablets, OR glucose gel. o Recheck blood sugar in 15 minutes after treatment (to make sure it is greater than 70 mg/dL). If your blood sugar is not greater than 70 mg/dL on recheck, call [phone] for further instructions. . Report your blood sugar to the short stay nurse when you get to Short Stay.  . If you are admitted to the hospital after surgery: o Your blood sugar will be checked by the staff and you will probably be given insulin after surgery (instead of oral diabetes medicines) to make sure you have good blood sugar levels. o The goal for blood sugar control after surgery is 80-180 mg/dL.   WHAT DO I DO ABOUT MY DIABETES MEDICATION?  Marland Kitchen Do not take oral diabetes medicines (pills) the morning of surgery.   Patient Signature:  Date:   Nurse Signature:  Date:   Reviewed and Endorsed by [facility] Patient Education Committee, August 2015           [facility] - Preparing for Surgery Before surgery, you can play an important role.  Because skin is not sterile, your skin needs to be as free of germs as  possible.  You can reduce the number of germs on your skin by washing with CHG (chlorahexidine gluconate) soap before surgery.  CHG is an antiseptic cleaner which kills germs and bonds with the skin to continue killing germs even after washing. Please DO NOT use if you have an allergy to CHG or antibacterial soaps.  If your skin becomes reddened/irritated stop using the CHG and inform your nurse when you arrive at Short Stay. Do not shave (including legs and underarms) for at least 48 hours prior to the first CHG shower.  You may shave your face/neck. Please follow these instructions carefully:  1.  Shower with CHG Soap the night before surgery and the  morning of Surgery.  2.  If you choose to wash your hair, wash your hair first as usual with your  normal  shampoo.  3.  After you shampoo, rinse your hair and body thoroughly to remove the  shampoo.                           4.  Use CHG as you would any other liquid soap.  You can apply chg directly  to the skin and wash                       Gently with a scrungie or clean washcloth.  5.  Apply the CHG Soap to your body ONLY FROM THE NECK DOWN.   Do not use on face/ open                           Wound or open sores. Avoid contact with eyes, ears mouth and genitals (private parts).                       Wash face,  Genitals (private parts) with your normal soap.             6.  Wash thoroughly, paying special attention to the area where your surgery  will be performed.  7.  Thoroughly rinse your body with warm water from the neck down.  8.  DO NOT shower/wash with your normal soap after using and rinsing off  the CHG Soap.                9.  Pat yourself dry with a clean towel.            10.  Wear clean pajamas.            11.  Place clean sheets on your bed the night of your first shower and do not  sleep with pets. Day of Surgery : Do not apply any lotions/deodorants the morning of surgery.  Please wear clean clothes to the hospital/surgery  center.  FAILURE TO FOLLOW THESE INSTRUCTIONS MAY RESULT IN THE CANCELLATION OF YOUR SURGERY

## 2016-08-24 NOTE — Telephone Encounter (Signed)
Pt informed he will need to schedule office visit before med refill. Patient was given refills last month and told no further rf's until seen by provider.  Scheduling appointment for Saturday. Transferred call to schedulers.

## 2016-08-24 NOTE — Telephone Encounter (Signed)
Pt calling for a refill on his metformin and Microzide he is having surgery on the 22nd and having pre surgery on the 11th for blood work EKG X-rays etc and Doc has told him that he need to be on medicine before surgery or it will be reschedule they will be removing a tumor please call pt to let him know when RX is sent to pharmacy

## 2016-08-24 NOTE — Telephone Encounter (Signed)
Pt is completely out of medication for his blood pressure and needs a refill   Best number [phone]

## 2016-08-24 NOTE — Progress Notes (Signed)
Pt has pre-op appointment Monday.  Need orders. Thank you!

## 2016-08-25 ENCOUNTER — Other Ambulatory Visit: Payer: Self-pay | Admitting: Physician Assistant

## 2016-08-25 ENCOUNTER — Ambulatory Visit (INDEPENDENT_AMBULATORY_CARE_PROVIDER_SITE_OTHER): Payer: No Typology Code available for payment source | Admitting: Physician Assistant

## 2016-08-25 VITALS — BP 132/82 | HR 70 | Temp 98.5°F | Resp 17 | Ht 71.5 in | Wt 249.0 lb

## 2016-08-25 DIAGNOSIS — E119 Type 2 diabetes mellitus without complications: Secondary | ICD-10-CM

## 2016-08-25 DIAGNOSIS — Z6834 Body mass index (BMI) 34.0-34.9, adult: Secondary | ICD-10-CM

## 2016-08-25 DIAGNOSIS — I10 Essential (primary) hypertension: Secondary | ICD-10-CM | POA: Diagnosis not present

## 2016-08-25 LAB — POCT GLYCOSYLATED HEMOGLOBIN (HGB A1C): HEMOGLOBIN A1C: 6.3

## 2016-08-25 LAB — GLUCOSE, POCT (MANUAL RESULT ENTRY): POC GLUCOSE: 93 mg/dL (ref 70–99)

## 2016-08-25 NOTE — Patient Instructions (Addendum)
  Keep up the great work with healthy eating. Get back into an exercise routine. Enjoy the holiday season. Merry Christmas!   IF you received an x-ray today, you will receive an invoice from [facility]. Please contact [facility] at [phone] with questions or concerns regarding your invoice.   IF you received labwork today, you will receive an invoice from [employer]. Please contact [facility] at [phone] with questions or concerns regarding your invoice.   Our billing staff will not be able to assist you with questions regarding bills from these companies.  You will be contacted with the lab results as soon as they are available. The fastest way to get your results is to activate your My Chart account. Instructions are located on the last page of this paperwork. If you have not heard from [place] regarding the results in 2 weeks, please contact this office.

## 2016-08-25 NOTE — Progress Notes (Signed)
Patient ID: Michael Church, male    DOB: [birth year], 60 y.o.   MRN: [id number]  PCP: Harrison Mons, PA-C, as of today. Previously managed by Dr. Everlene Farrier.  Chief Complaint  Patient presents with  . Medication Refill    hctz,metformin     Subjective:   Presents for evaluation of diabetes and HTN.  He feels well, is tolerating his medications and has no concerns. His A1C was 6.2% in April 2017. He's making healthy eating choices, but has slacked off on his exercise and has gained some weight. He's motivated to increase the exercise again and has support of his wife and son.  A renal mass present for several years has been enlarging, and he has schedule removal on 09/07/16 with Dr. Louis Meckel.   Review of Systems  Constitutional: Negative for activity change, appetite change, fatigue and unexpected weight change.  HENT: Negative for congestion, dental problem, ear pain, hearing loss, mouth sores, postnasal drip, rhinorrhea, sneezing, sore throat, tinnitus and trouble swallowing.   Eyes: Negative for photophobia, pain, redness and visual disturbance.  Respiratory: Negative for cough, chest tightness and shortness of breath.   Cardiovascular: Negative for chest pain, palpitations and leg swelling.  Gastrointestinal: Negative for abdominal pain, blood in stool, constipation, diarrhea, nausea and vomiting.  Endocrine: Negative for cold intolerance, heat intolerance, polydipsia, polyphagia and polyuria.  Genitourinary: Negative for dysuria, frequency, hematuria and urgency.  Musculoskeletal: Negative for arthralgias, gait problem, myalgias and neck stiffness.  Skin: Negative for rash.  Neurological: Negative for dizziness, speech difficulty, weakness, light-headedness, numbness and headaches.  Hematological: Negative for adenopathy.  Psychiatric/Behavioral: Negative for confusion and sleep disturbance. The patient is not nervous/anxious.        Patient Active Problem List   Diagnosis  Date Noted  . Type 2 diabetes mellitus without complications ([place]) [id number]  . Renal mass 01/11/2015  . Hyperglycemia 01/12/2014  . Hypertension 09/22/2012  . Varicose vein of leg 09/22/2012     Prior to Admission medications   Medication Sig Start Date End Date Taking? Authorizing Provider  aspirin EC 81 MG tablet Take 81 mg by mouth daily.   Yes Historical Provider, MD  cholecalciferol (VITAMIN D) 1000 units tablet Take 1,000 Units by mouth daily.   Yes Historical Provider, MD  hydrochlorothiazide (MICROZIDE) 12.5 MG capsule Take 1 capsule (12.5 mg total) by mouth daily. 06/29/16  Yes Jaynee Eagles, PA-C  ibuprofen (ADVIL,MOTRIN) 200 MG tablet Take 600 mg by mouth every 6 (six) hours as needed (pain).   Yes Historical Provider, MD  metFORMIN (GLUCOPHAGE) 500 MG tablet TAKE 1 TABLET(500 MG) BY MOUTH TWICE DAILY WITH A MEAL 07/23/16  Yes Mancel Bale, PA-C  Multiple Vitamin (MULTIVITAMIN WITH MINERALS) TABS Take 1 tablet by mouth daily.   Yes Historical Provider, MD  sodium chloride (OCEAN) 0.65 % SOLN nasal spray Place 1 spray into both nostrils every 6 (six) hours as needed for congestion (allergies).   Yes Historical Provider, MD     Allergies  Allergen Reactions  . Other Itching    Foods - cantaloupes, tomatoes, and  raw carrots.  . Penicillins Itching    Has patient had a PCN reaction causing immediate rash, facial/tongue/throat swelling, SOB or lightheadedness with hypotension: No Has patient had a PCN reaction causing severe rash involving mucus membranes or skin necrosis: No Has patient had a PCN reaction that required hospitalization: No Has patient had a PCN reaction occurring within the last 10 years: No If all of the above answers  are "NO", then may proceed with Cephalosporin use.        Objective:  Physical Exam  Constitutional: He is oriented to person, place, and time. He appears well-developed and well-nourished. He is active and cooperative. No distress.  BP  132/82 (BP Location: Right Arm, Patient Position: Sitting, Cuff Size: Normal)   Pulse 70   Temp 98.5 F (36.9 C) (Oral)   Resp 17   Ht 5' 11.5" (1.816 m)   Wt 249 lb (112.9 kg)   SpO2 98%   BMI 34.24 kg/m   HENT:  Head: Normocephalic and atraumatic.  Right Ear: Hearing normal.  Left Ear: Hearing normal.  Eyes: Conjunctivae are normal. No scleral icterus.  Neck: Normal range of motion. Neck supple. No thyromegaly present.  Cardiovascular: Normal rate, regular rhythm and normal heart sounds.   Pulses:      Radial pulses are 2+ on the right side, and 2+ on the left side.  Pulmonary/Chest: Effort normal and breath sounds normal.  Lymphadenopathy:       Head (right side): No tonsillar, no preauricular, no posterior auricular and no occipital adenopathy present.       Head (left side): No tonsillar, no preauricular, no posterior auricular and no occipital adenopathy present.    He has no cervical adenopathy.       Right: No supraclavicular adenopathy present.       Left: No supraclavicular adenopathy present.  Neurological: He is alert and oriented to person, place, and time. No sensory deficit.  Skin: Skin is warm, dry and intact. No rash noted. No cyanosis or erythema. Nails show no clubbing.  Psychiatric: He has a normal mood and affect. His speech is normal and behavior is normal.       Results for orders placed or performed in visit on 08/25/16  POCT glucose (manual entry)  Result Value Ref Range   POC Glucose 93 70 - 99 mg/dl  POCT glycosylated hemoglobin (Hb A1C)  Result Value Ref Range   Hemoglobin A1C 6.3        Assessment & Plan:   1. Type 2 diabetes mellitus without complication, without long-term current use of insulin (HCC) Controlled. Await remaining labs. Continue healthy lifestyle changes. Encouraged exercise. - Lipid panel - Comprehensive metabolic panel - POCT glucose (manual entry) - POCT glycosylated hemoglobin (Hb A1C)  2. Essential  hypertension Controlled.  3. BMI 34.0-34.9,adult See above.   Fara Chute, PA-C Physician Assistant-Certified [facility]

## 2016-08-26 LAB — COMPREHENSIVE METABOLIC PANEL
ALBUMIN: 4.5 g/dL (ref 3.5–5.5)
ALK PHOS: 45 IU/L (ref 39–117)
ALT: 26 IU/L (ref 0–44)
AST: 18 IU/L (ref 0–40)
Albumin/Globulin Ratio: 1.7 (ref 1.2–2.2)
BUN / CREAT RATIO: 12 (ref 9–20)
BUN: 12 mg/dL (ref 6–24)
Bilirubin Total: 0.5 mg/dL (ref 0.0–1.2)
CO2: 24 mmol/L (ref 18–29)
CREATININE: 0.98 mg/dL (ref 0.76–1.27)
Calcium: 9.6 mg/dL (ref 8.7–10.2)
Chloride: 99 mmol/L (ref 96–106)
GFR calc non Af Amer: 85 mL/min/{1.73_m2} (ref >59)
GFR, EST AFRICAN AMERICAN: 98 mL/min/{1.73_m2} (ref >59)
GLOBULIN, TOTAL: 2.6 g/dL (ref 1.5–4.5)
Glucose: 91 mg/dL (ref 65–99)
Potassium: 4.9 mmol/L (ref 3.5–5.2)
SODIUM: 139 mmol/L (ref 134–144)
TOTAL PROTEIN: 7.1 g/dL (ref 6.0–8.5)

## 2016-08-26 LAB — LIPID PANEL
CHOLESTEROL TOTAL: 186 mg/dL (ref 100–199)
Chol/HDL Ratio: 2.7 ratio units (ref 0.0–5.0)
HDL: 69 mg/dL (ref >39)
LDL Calculated: 102 mg/dL — ABNORMAL HIGH (ref 0–99)
Triglycerides: 74 mg/dL (ref 0–149)
VLDL CHOLESTEROL CAL: 15 mg/dL (ref 5–40)

## 2016-08-27 ENCOUNTER — Encounter (HOSPITAL_COMMUNITY)
Admission: RE | Admit: 2016-08-27 | Discharge: 2016-08-27 | Disposition: A | Discharge status: Home / Self Care | Payer: No Typology Code available for payment source | Source: Ambulatory Visit | Attending: Urology | Admitting: Urology

## 2016-08-27 ENCOUNTER — Other Ambulatory Visit: Payer: Self-pay | Admitting: Physician Assistant

## 2016-08-27 ENCOUNTER — Other Ambulatory Visit: Payer: Self-pay

## 2016-08-27 ENCOUNTER — Encounter (HOSPITAL_COMMUNITY): Payer: Self-pay

## 2016-08-27 DIAGNOSIS — N2889 Other specified disorders of kidney and ureter: Secondary | ICD-10-CM | POA: Diagnosis not present

## 2016-08-27 DIAGNOSIS — E119 Type 2 diabetes mellitus without complications: Secondary | ICD-10-CM | POA: Insufficient documentation

## 2016-08-27 DIAGNOSIS — I10 Essential (primary) hypertension: Secondary | ICD-10-CM | POA: Insufficient documentation

## 2016-08-27 DIAGNOSIS — Z0183 Encounter for blood typing: Secondary | ICD-10-CM | POA: Insufficient documentation

## 2016-08-27 DIAGNOSIS — Z01818 Encounter for other preprocedural examination: Secondary | ICD-10-CM | POA: Diagnosis not present

## 2016-08-27 DIAGNOSIS — Z01812 Encounter for preprocedural laboratory examination: Secondary | ICD-10-CM | POA: Diagnosis not present

## 2016-08-27 HISTORY — DX: Other intervertebral disc displacement, lumbar region: M51.26

## 2016-08-27 HISTORY — DX: Adverse effect of unspecified anesthetic, initial encounter: T41.45XA

## 2016-08-27 HISTORY — DX: Other specified disorders of kidney and ureter: N28.89

## 2016-08-27 HISTORY — DX: Type 2 diabetes mellitus without complications: E11.9

## 2016-08-27 HISTORY — DX: Other complications of anesthesia, initial encounter: T88.59XA

## 2016-08-27 LAB — TYPE AND SCREEN
ABO/RH(D): AB POS
ANTIBODY SCREEN: NEGATIVE

## 2016-08-27 LAB — CBC
HCT: 45.1 % (ref 39.0–52.0)
HEMOGLOBIN: 15.3 g/dL (ref 13.0–17.0)
MCH: 29.1 pg (ref 26.0–34.0)
MCHC: 33.9 g/dL (ref 30.0–36.0)
MCV: 85.7 fL (ref 78.0–100.0)
Platelets: 237 10*3/uL (ref 150–400)
RBC: 5.26 MIL/uL (ref 4.22–5.81)
RDW: 13.1 % (ref 11.5–15.5)
WBC: 3.9 10*3/uL — ABNORMAL LOW (ref 4.0–10.5)

## 2016-08-27 LAB — ABO/RH: ABO/RH(D): AB POS

## 2016-08-27 LAB — GLUCOSE, CAPILLARY: Glucose-Capillary: 91 mg/dL (ref 65–99)

## 2016-08-27 NOTE — Pre-Procedure Instructions (Addendum)
EKG done today. CMP, Hgb A1C = 6.3 Epic.

## 2016-08-28 ENCOUNTER — Encounter: Payer: Self-pay | Admitting: Physician Assistant

## 2016-08-31 ENCOUNTER — Other Ambulatory Visit: Payer: Self-pay

## 2016-08-31 MED ORDER — METFORMIN HCL 500 MG PO TABS: ORAL_TABLET | ORAL | 3 refills | Status: DC

## 2016-08-31 NOTE — Telephone Encounter (Signed)
Received request for refill - addressed to Judson Roch, went to Brien Few is provider.  She approved refill #[id number], 3RF Sent.

## 2016-09-07 ENCOUNTER — Encounter (HOSPITAL_COMMUNITY): Payer: Self-pay | Admitting: *Deleted

## 2016-09-07 ENCOUNTER — Inpatient Hospital Stay (HOSPITAL_COMMUNITY): Payer: No Typology Code available for payment source | Admitting: Certified Registered Nurse Anesthetist

## 2016-09-07 ENCOUNTER — Encounter (HOSPITAL_COMMUNITY)
Admission: RE | Disposition: A | Discharge status: Home / Self Care | Payer: Self-pay | Source: Ambulatory Visit | Attending: Urology

## 2016-09-07 ENCOUNTER — Inpatient Hospital Stay (HOSPITAL_COMMUNITY)
Admission: RE | Admit: 2016-09-07 | Discharge: 2016-09-08 | DRG: 661 | Disposition: A | Discharge status: Home / Self Care | Payer: No Typology Code available for payment source | Source: Ambulatory Visit | Attending: Urology | Admitting: Urology

## 2016-09-07 DIAGNOSIS — I10 Essential (primary) hypertension: Secondary | ICD-10-CM | POA: Diagnosis present

## 2016-09-07 DIAGNOSIS — E1151 Type 2 diabetes mellitus with diabetic peripheral angiopathy without gangrene: Secondary | ICD-10-CM | POA: Diagnosis present

## 2016-09-07 DIAGNOSIS — N2889 Other specified disorders of kidney and ureter: Principal | ICD-10-CM | POA: Diagnosis present

## 2016-09-07 DIAGNOSIS — Z6834 Body mass index (BMI) 34.0-34.9, adult: Secondary | ICD-10-CM | POA: Diagnosis not present

## 2016-09-07 DIAGNOSIS — Z7982 Long term (current) use of aspirin: Secondary | ICD-10-CM

## 2016-09-07 DIAGNOSIS — Z7984 Long term (current) use of oral hypoglycemic drugs: Secondary | ICD-10-CM

## 2016-09-07 DIAGNOSIS — Z880 Allergy status to penicillin: Secondary | ICD-10-CM

## 2016-09-07 HISTORY — PX: ROBOTIC ASSITED PARTIAL NEPHRECTOMY: SHX6087

## 2016-09-07 HISTORY — PX: PARTIAL NEPHRECTOMY: SHX414

## 2016-09-07 LAB — GLUCOSE, CAPILLARY
GLUCOSE-CAPILLARY: 117 mg/dL — AB (ref 65–99)
GLUCOSE-CAPILLARY: 135 mg/dL — AB (ref 65–99)
GLUCOSE-CAPILLARY: 149 mg/dL — AB (ref 65–99)
Glucose-Capillary: 99 mg/dL (ref 65–99)

## 2016-09-07 LAB — HEMOGLOBIN AND HEMATOCRIT, BLOOD
HEMATOCRIT: 43 % (ref 39.0–52.0)
HEMOGLOBIN: 14.4 g/dL (ref 13.0–17.0)

## 2016-09-07 SURGERY — NEPHRECTOMY, PARTIAL, ROBOT-ASSISTED
Anesthesia: General | Laterality: Right

## 2016-09-07 MED ORDER — DIPHENHYDRAMINE HCL 50 MG/ML IJ SOLN: 12.5000 mg | Freq: Four times a day (QID) | INTRAMUSCULAR | Status: DC | PRN

## 2016-09-07 MED ORDER — ROCURONIUM BROMIDE 100 MG/10ML IV SOLN
INTRAVENOUS | Status: DC | PRN
  Administered 2016-09-07: 10 mg via INTRAVENOUS
  Administered 2016-09-07: 50 mg via INTRAVENOUS
  Administered 2016-09-07: 20 mg via INTRAVENOUS

## 2016-09-07 MED ORDER — SUGAMMADEX SODIUM 500 MG/5ML IV SOLN
INTRAVENOUS | Status: DC | PRN
  Administered 2016-09-07: 300 mg via INTRAVENOUS

## 2016-09-07 MED ORDER — INSULIN ASPART 100 UNIT/ML ~~LOC~~ SOLN
0.0000 [IU] | Freq: Three times a day (TID) | SUBCUTANEOUS | Status: DC
  Administered 2016-09-07: 2 [IU] via SUBCUTANEOUS

## 2016-09-07 MED ORDER — ACETAMINOPHEN 10 MG/ML IV SOLN
INTRAVENOUS | Status: DC | PRN
  Administered 2016-09-07: 1000 mg via INTRAVENOUS

## 2016-09-07 MED ORDER — TRAMADOL HCL 50 MG PO TABS: 50.0000 mg | ORAL_TABLET | Freq: Four times a day (QID) | ORAL | 0 refills | Status: DC | PRN

## 2016-09-07 MED ORDER — MIDAZOLAM HCL 2 MG/2ML IJ SOLN
INTRAMUSCULAR | Status: AC
  Filled 2016-09-07: qty 2

## 2016-09-07 MED ORDER — FENTANYL CITRATE (PF) 100 MCG/2ML IJ SOLN
INTRAMUSCULAR | Status: DC | PRN
  Administered 2016-09-07 (×2): 50 ug via INTRAVENOUS
  Administered 2016-09-07: 150 ug via INTRAVENOUS
  Administered 2016-09-07 (×2): 50 ug via INTRAVENOUS

## 2016-09-07 MED ORDER — SODIUM CHLORIDE 0.9 % IJ SOLN
INTRAMUSCULAR | Status: DC | PRN
  Administered 2016-09-07: 13:00:00
  Administered 2016-09-07: 10 mL

## 2016-09-07 MED ORDER — LACTATED RINGERS IV SOLN
INTRAVENOUS | Status: DC | PRN
  Administered 2016-09-07 (×2): via INTRAVENOUS

## 2016-09-07 MED ORDER — HYDROMORPHONE HCL 1 MG/ML IJ SOLN
INTRAMUSCULAR | Status: AC
  Filled 2016-09-07: qty 1

## 2016-09-07 MED ORDER — CLINDAMYCIN PHOSPHATE 900 MG/50ML IV SOLN
INTRAVENOUS | Status: DC | PRN
  Administered 2016-09-07: 900 mg via INTRAVENOUS

## 2016-09-07 MED ORDER — SUGAMMADEX SODIUM 200 MG/2ML IV SOLN
INTRAVENOUS | Status: AC
  Filled 2016-09-07: qty 4

## 2016-09-07 MED ORDER — BUPIVACAINE LIPOSOME 1.3 % IJ SUSP
INTRAMUSCULAR | Status: DC | PRN
  Administered 2016-09-07: 20 mL

## 2016-09-07 MED ORDER — OXYCODONE HCL 5 MG PO TABS: 5.0000 mg | ORAL_TABLET | Freq: Once | ORAL | Status: DC | PRN

## 2016-09-07 MED ORDER — ONDANSETRON HCL 4 MG/2ML IJ SOLN
INTRAMUSCULAR | Status: DC | PRN
  Administered 2016-09-07 (×2): 4 mg via INTRAVENOUS

## 2016-09-07 MED ORDER — EPHEDRINE SULFATE 50 MG/ML IJ SOLN
INTRAMUSCULAR | Status: DC | PRN
  Administered 2016-09-07 (×2): 5 mg via INTRAVENOUS
  Administered 2016-09-07: 10 mg via INTRAVENOUS

## 2016-09-07 MED ORDER — HYDROMORPHONE HCL 1 MG/ML IJ SOLN
0.2500 mg | INTRAMUSCULAR | Status: DC | PRN
  Administered 2016-09-07 (×4): 0.5 mg via INTRAVENOUS

## 2016-09-07 MED ORDER — BUPIVACAINE LIPOSOME 1.3 % IJ SUSP
INTRAMUSCULAR | Status: AC
  Filled 2016-09-07: qty 20

## 2016-09-07 MED ORDER — DEXAMETHASONE SODIUM PHOSPHATE 10 MG/ML IJ SOLN
INTRAMUSCULAR | Status: AC
  Filled 2016-09-07: qty 1

## 2016-09-07 MED ORDER — MIDAZOLAM HCL 5 MG/5ML IJ SOLN
INTRAMUSCULAR | Status: DC | PRN
  Administered 2016-09-07 (×2): 1 mg via INTRAVENOUS

## 2016-09-07 MED ORDER — ROCURONIUM BROMIDE 50 MG/5ML IV SOSY
PREFILLED_SYRINGE | INTRAVENOUS | Status: AC
  Filled 2016-09-07: qty 5

## 2016-09-07 MED ORDER — FENTANYL CITRATE (PF) 100 MCG/2ML IJ SOLN
INTRAMUSCULAR | Status: AC
  Filled 2016-09-07: qty 2

## 2016-09-07 MED ORDER — CLINDAMYCIN PHOSPHATE 900 MG/50ML IV SOLN: 900.0000 mg | INTRAVENOUS | Status: DC

## 2016-09-07 MED ORDER — OXYCODONE HCL 5 MG/5ML PO SOLN
5.0000 mg | Freq: Once | ORAL | Status: DC | PRN
  Filled 2016-09-07: qty 5

## 2016-09-07 MED ORDER — PROPOFOL 10 MG/ML IV BOLUS
INTRAVENOUS | Status: DC | PRN
  Administered 2016-09-07: 175 mg via INTRAVENOUS
  Administered 2016-09-07: 25 mg via INTRAVENOUS

## 2016-09-07 MED ORDER — STERILE WATER FOR IRRIGATION IR SOLN
Status: DC | PRN
  Administered 2016-09-07: 1000 mL

## 2016-09-07 MED ORDER — HYDROMORPHONE HCL 2 MG/ML IJ SOLN
0.5000 mg | INTRAMUSCULAR | Status: DC | PRN
  Administered 2016-09-07: 1 mg via INTRAVENOUS
  Filled 2016-09-07: qty 1

## 2016-09-07 MED ORDER — ACETAMINOPHEN 325 MG PO TABS: 650.0000 mg | ORAL_TABLET | ORAL | Status: DC | PRN

## 2016-09-07 MED ORDER — LIDOCAINE HCL (CARDIAC) 20 MG/ML IV SOLN
INTRAVENOUS | Status: DC | PRN
  Administered 2016-09-07: 100 mg via INTRAVENOUS

## 2016-09-07 MED ORDER — ACETAMINOPHEN 10 MG/ML IV SOLN
1000.0000 mg | Freq: Four times a day (QID) | INTRAVENOUS | Status: AC
  Administered 2016-09-07 – 2016-09-08 (×3): 1000 mg via INTRAVENOUS
  Filled 2016-09-07 (×4): qty 100

## 2016-09-07 MED ORDER — PROPOFOL 10 MG/ML IV BOLUS
INTRAVENOUS | Status: AC
  Filled 2016-09-07: qty 20

## 2016-09-07 MED ORDER — CLINDAMYCIN PHOSPHATE 900 MG/50ML IV SOLN
INTRAVENOUS | Status: AC
  Filled 2016-09-07: qty 50

## 2016-09-07 MED ORDER — SUCCINYLCHOLINE CHLORIDE 20 MG/ML IJ SOLN
INTRAMUSCULAR | Status: DC | PRN
  Administered 2016-09-07: 20 mg via INTRAVENOUS
  Administered 2016-09-07: 140 mg via INTRAVENOUS

## 2016-09-07 MED ORDER — FENTANYL CITRATE (PF) 250 MCG/5ML IJ SOLN
INTRAMUSCULAR | Status: AC
  Filled 2016-09-07: qty 5

## 2016-09-07 MED ORDER — SODIUM CHLORIDE 0.45 % IV SOLN
INTRAVENOUS | Status: DC
  Administered 2016-09-07 – 2016-09-08 (×2): via INTRAVENOUS

## 2016-09-07 MED ORDER — SODIUM CHLORIDE 0.9 % IV SOLN
INTRAVENOUS | Status: DC | PRN
  Administered 2016-09-07: 11:00:00 via INTRAVENOUS

## 2016-09-07 MED ORDER — BUPIVACAINE HCL (PF) 0.25 % IJ SOLN
INTRAMUSCULAR | Status: DC | PRN
  Administered 2016-09-07: 13 mL

## 2016-09-07 MED ORDER — PHENYLEPHRINE HCL 10 MG/ML IJ SOLN
INTRAMUSCULAR | Status: DC | PRN
  Administered 2016-09-07 (×2): 40 ug via INTRAVENOUS
  Administered 2016-09-07: 80 ug via INTRAVENOUS

## 2016-09-07 MED ORDER — SODIUM CHLORIDE 0.9 % IJ SOLN
INTRAMUSCULAR | Status: AC
  Filled 2016-09-07: qty 10

## 2016-09-07 MED ORDER — SODIUM CHLORIDE 0.9 % IR SOLN
Status: DC | PRN
  Administered 2016-09-07: 1000 mL

## 2016-09-07 MED ORDER — ONDANSETRON HCL 4 MG/2ML IJ SOLN: 4.0000 mg | INTRAMUSCULAR | Status: DC | PRN

## 2016-09-07 MED ORDER — ACETAMINOPHEN 10 MG/ML IV SOLN
INTRAVENOUS | Status: AC
  Filled 2016-09-07: qty 100

## 2016-09-07 MED ORDER — SODIUM CHLORIDE 0.9 % IJ SOLN
INTRAMUSCULAR | Status: AC
  Filled 2016-09-07: qty 20

## 2016-09-07 MED ORDER — BUPIVACAINE HCL (PF) 0.25 % IJ SOLN
INTRAMUSCULAR | Status: AC
  Filled 2016-09-07: qty 30

## 2016-09-07 MED ORDER — HYDROCODONE-ACETAMINOPHEN 5-325 MG PO TABS
1.0000 | ORAL_TABLET | ORAL | Status: DC | PRN
  Administered 2016-09-07 (×2): 2 via ORAL
  Filled 2016-09-07 (×2): qty 2

## 2016-09-07 MED ORDER — ONDANSETRON HCL 4 MG/2ML IJ SOLN
INTRAMUSCULAR | Status: AC
  Filled 2016-09-07: qty 2

## 2016-09-07 MED ORDER — DEXAMETHASONE SODIUM PHOSPHATE 10 MG/ML IJ SOLN
INTRAMUSCULAR | Status: DC | PRN
  Administered 2016-09-07: 10 mg via INTRAVENOUS

## 2016-09-07 MED ORDER — SUGAMMADEX SODIUM 200 MG/2ML IV SOLN
INTRAVENOUS | Status: AC
  Filled 2016-09-07: qty 2

## 2016-09-07 MED ORDER — LIDOCAINE 2% (20 MG/ML) 5 ML SYRINGE
INTRAMUSCULAR | Status: AC
  Filled 2016-09-07: qty 5

## 2016-09-07 MED ORDER — DIPHENHYDRAMINE HCL 12.5 MG/5ML PO ELIX: 12.5000 mg | ORAL_SOLUTION | Freq: Four times a day (QID) | ORAL | Status: DC | PRN

## 2016-09-07 SURGICAL SUPPLY — 60 items
APPLICATOR ARISTA FLEXITIP XL (MISCELLANEOUS) IMPLANT
APPLICATOR SURGIFLO ENDO (HEMOSTASIS) IMPLANT
CHLORAPREP W/TINT 26ML (MISCELLANEOUS) ×2 IMPLANT
CLIP LIGATING HEM O LOK PURPLE (MISCELLANEOUS) ×2 IMPLANT
CLIP LIGATING HEMO LOK XL GOLD (MISCELLANEOUS) IMPLANT
CLIP LIGATING HEMO O LOK GREEN (MISCELLANEOUS) ×4 IMPLANT
COVER SURGICAL LIGHT HANDLE (MISCELLANEOUS) ×2 IMPLANT
COVER TIP SHEARS 8 DVNC (MISCELLANEOUS) ×1 IMPLANT
COVER TIP SHEARS 8MM DA VINCI (MISCELLANEOUS) ×1
DECANTER SPIKE VIAL GLASS SM (MISCELLANEOUS) IMPLANT
DERMABOND ADVANCED (GAUZE/BANDAGES/DRESSINGS) ×1
DERMABOND ADVANCED .7 DNX12 (GAUZE/BANDAGES/DRESSINGS) ×1 IMPLANT
DRAIN CHANNEL 15F RND FF 3/16 (WOUND CARE) ×2 IMPLANT
DRAPE ARM DVNC X/XI (DISPOSABLE) ×4 IMPLANT
DRAPE COLUMN DVNC XI (DISPOSABLE) ×1 IMPLANT
DRAPE DA VINCI XI ARM (DISPOSABLE) ×4
DRAPE DA VINCI XI COLUMN (DISPOSABLE) ×1
DRAPE INCISE IOBAN 66X45 STRL (DRAPES) ×2 IMPLANT
DRAPE LAPAROSCOPIC ABDOMINAL (DRAPES) ×2 IMPLANT
DRAPE SHEET LG 3/4 BI-LAMINATE (DRAPES) ×2 IMPLANT
ELECT PENCIL ROCKER SW 15FT (MISCELLANEOUS) ×2 IMPLANT
ELECT REM PT RETURN 9FT ADLT (ELECTROSURGICAL) ×2
ELECTRODE REM PT RTRN 9FT ADLT (ELECTROSURGICAL) ×1 IMPLANT
EVACUATOR SILICONE 100CC (DRAIN) ×2 IMPLANT
GLOVE BIO SURGEON STRL SZ 6.5 (GLOVE) ×2 IMPLANT
GLOVE BIOGEL M STRL SZ7.5 (GLOVE) ×4 IMPLANT
GOWN STRL REUS W/TWL LRG LVL3 (GOWN DISPOSABLE) ×6 IMPLANT
HEMOSTAT ARISTA ABSORB 3G PWDR (MISCELLANEOUS) IMPLANT
IRRIG SUCT STRYKERFLOW 2 WTIP (MISCELLANEOUS) ×2
IRRIGATION SUCT STRKRFLW 2 WTP (MISCELLANEOUS) ×1 IMPLANT
KIT BASIN OR (CUSTOM PROCEDURE TRAY) ×2 IMPLANT
LOOP VESSEL MAXI BLUE (MISCELLANEOUS) IMPLANT
MARKER SKIN DUAL TIP RULER LAB (MISCELLANEOUS) ×2 IMPLANT
NS IRRIG 1000ML POUR BTL (IV SOLUTION) IMPLANT
PORT ACCESS TROCAR AIRSEAL 12 (TROCAR) IMPLANT
PORT ACCESS TROCAR AIRSEAL 5M (TROCAR)
POSITIONER SURGICAL ARM (MISCELLANEOUS) ×4 IMPLANT
POUCH SPECIMEN RETRIEVAL 10MM (ENDOMECHANICALS) ×2 IMPLANT
SEAL CANN UNIV 5-8 DVNC XI (MISCELLANEOUS) ×4 IMPLANT
SEAL XI 5MM-8MM UNIVERSAL (MISCELLANEOUS) ×4
SET TRI-LUMEN FLTR TB AIRSEAL (TUBING) ×2 IMPLANT
SOLUTION ELECTROLUBE (MISCELLANEOUS) ×2 IMPLANT
SPOGE SURGIFLO 8M (HEMOSTASIS) ×1
SPONGE SURGIFLO 8M (HEMOSTASIS) ×1 IMPLANT
SURGIFLO W/THROMBIN 8M KIT (HEMOSTASIS) ×2 IMPLANT
SUT ETHILON 3 0 PS 1 (SUTURE) ×2 IMPLANT
SUT MNCRL AB 4-0 PS2 18 (SUTURE) ×4 IMPLANT
SUT V-LOC BARB 180 2/0GR6 GS22 (SUTURE) ×2
SUT VICRYL 0 UR6 27IN ABS (SUTURE) ×6 IMPLANT
SUT VLOC BARB 180 ABS3/0GR12 (SUTURE) ×2
SUTURE V-LC BRB 180 2/0GR6GS22 (SUTURE) ×1 IMPLANT
SUTURE VLOC BRB 180 ABS3/0GR12 (SUTURE) ×1 IMPLANT
TAPE STRIPS DRAPE STRL (GAUZE/BANDAGES/DRESSINGS) IMPLANT
TOWEL OR 17X26 10 PK STRL BLUE (TOWEL DISPOSABLE) ×2 IMPLANT
TOWEL OR NON WOVEN STRL DISP B (DISPOSABLE) ×2 IMPLANT
TRAY FOLEY W/METER SILVER 16FR (SET/KITS/TRAYS/PACK) ×2 IMPLANT
TRAY LAPAROSCOPIC (CUSTOM PROCEDURE TRAY) ×2 IMPLANT
TROCAR BLADELESS OPT 5 100 (ENDOMECHANICALS) ×2 IMPLANT
TROCAR XCEL 12X100 BLDLESS (ENDOMECHANICALS) IMPLANT
WATER STERILE IRR 1500ML POUR (IV SOLUTION) IMPLANT

## 2016-09-07 NOTE — Op Note (Signed)
Preoperative diagnosis:  1. right renal mass   Postoperative diagnosis:  1. same   Procedure: 1. Robotic assisted laparoscopic right partial nephrectomy  Surgeon: Ardis Hughs, MD 1st assistant: Debbrah Alar, PA-C  Anesthesia: General  Complications: None  Intraoperative findings:  #1. Posterior endophytic lesion, gross margins clear #2. Warm ischemia time 19 minutes  EBL: 50cc  Specimens: right renal mass   Indication:  Michael Church is a 60 y.o. patient with right renal mass.  After reviewing the management options for treatment, he elected to proceed with the above surgical procedure(s). We have discussed the potential benefits and risks of the procedure, side effects of the proposed treatment, the likelihood of the patient achieving the goals of the procedure, and any potential problems that might occur during the procedure or recuperation. Informed consent has been obtained.   Description of procedure:  An assistant was required for this surgical procedure.  The duties of the assistant included but were not limited to suctioning, passing suture, camera manipulation, retraction. This procedure would not be able to be performed without an Environmental consultant.  The patient was taken to the operating room and a general anesthetic was administered. The patient was given preoperative antibiotics, placed in the right modified flank position with care to pad all potential pressure points, and prepped and draped in the usual sterile fashion. Next a preoperative timeout was performed.  A site was selected on the right side of the umbilicus for placement of the camera port. This was placed using a standard modified Hassan technique with entry into the peritoneum and placement of the robotic 30 deg laparoscope. We entered the peritoneum without incident and established pneumoperitoneum. The camera was then used to inspect the abdomen and there was no evidence of any intra-abdominal  injuries or other abnormalities. The remaining abdominal ports were then placed. 8 mm robotic ports were placed in the right upper quadrant, right lower quadrant, and far right lateral abdominal wall. A 12 mm port was placed in the upper midline for laparoscopic assistance. All ports were placed under direct vision without difficulty. The surgical cart was then docked. I then detached the liver from the surrounding structures and placed a liver retractor just below the xyphoid process clamping it to the right lateral wall of the peritoneum.  Utilizing the cautery scissors, the white line of Toldt was incised allowing the colon to be mobilized medially and the plane between the mesocolon and the anterior layer of Gerota's fascia to be developed and the kidney to be exposed. The ureter and gonadal vein were identified inferiorly and the ureter was lifted anteriorly off the psoas muscle. Dissection proceeded superiorly along the gonadal vein until the renal vein was identified. The renal hilum was then carefully isolated with a combination of blunt and sharp dissection allowing the renal arterial and venous structures to be separated and isolated in preparation for renal hilar vessel clamping.  Attention turned to the kidney and the perinephric fat surrounding the renal mass was removed and the kidney was mobilized sufficiently for exposure and resection of the renal mass.It was necessary to mobilize the kidney enough so that it could be flipped medially in order to get to the mass on the posterior upper pole region.  The margins were then demarcated using intraoperative ultrasound.  Once the renal mass was properly isolated, preparations were made for resection of the tumor. The renal artery was then clamped with a bulldog clamp. The tumor was then excised with cold scissor  dissection along with an adequate visible gross margin of normal renal parenchyma. The tumor appeared to be excised without any  gross violation of the tumor. The renal collecting system was not entered during removal of the tumor. A running 3-0 V-lock suture was then brought through the capsule of the kidney and run along the base of the renal defect to provide hemostasis and close any entry into the renal collecting system if present. Weck clips were used to secure this suture outside the renal capsule at the proximal and distal ends. The bulldog clamps were then removed from the renal hilar vessel. A running 2-0 V lock suture was then used to close the capsule of the kidney using a sliding clip technique which resulted in excellent hemostasis. An additional hemostatic agent (Surgiflo) was then placed into the renal defect.Surgicel was then placed over the defect.   Total warm renal ischemia time was  19 minutes. The renal tumor resection site was examined. Hemostasis appeared adequate.   The kidney was placed back into its normal anatomic position and covered with perinephric fat as needed. A # 75 Blake drain was then brought through the lateral lower port site and positioned in the perinephric space. It was secured to the skin with a nylon suture. The surgical robotic cart was undocked. The renal tumor specimen was removed intact within an endopouch retrieval bag via the camera port sites. The camera port site and the other 12 mm port site were then closed at the fascial level with 0-vicryl suture. All other laparoscopic/robotic ports were removed under direct vision and the pneumoperitoneum let down with inspection of the operative field performed and hemostasis again confirmed. All incision sites were then injected with local anesthetic and reapproximated at the skin level with 4-0 monocryl subcuticular closures. Dermabond was applied to the skin. The patient tolerated the procedure well and without complications. The patient was able to be extubated and transferred to the recovery unit in satisfactory  condition.  Ardis Hughs, M.D.

## 2016-09-07 NOTE — Anesthesia Postprocedure Evaluation (Signed)
Anesthesia Post Note  Patient: Michael Church  Procedure(s) Performed: Procedure(s) (LRB): XI ROBOTIC ASSITED LAPAROSCOPIC RIGHT PARTIAL NEPHRECTOMY (Right)  Patient location during evaluation: PACU Anesthesia Type: General Level of consciousness: awake and alert Pain management: pain level controlled Vital Signs Assessment: post-procedure vital signs reviewed and stable Respiratory status: spontaneous breathing, nonlabored ventilation, respiratory function stable and patient connected to nasal cannula oxygen Cardiovascular status: blood pressure returned to baseline and stable Postop Assessment: no signs of nausea or vomiting Anesthetic complications: no       Last Vitals:  Vitals:   09/07/16 0500 09/07/16 1135  BP: (!) 139/94 120/72  Pulse: 70 94  Resp: 18 11  Temp: 36.5 C 37 C    Last Pain:  Vitals:   09/07/16 0500  TempSrc: Oral                 [name]

## 2016-09-07 NOTE — Anesthesia Procedure Notes (Signed)
Procedure Name: Intubation Date/Time: 09/07/2016 7:36 AM Performed by: Oleta Mouse Pre-anesthesia Checklist: Patient identified, Emergency Drugs available, Suction available, Patient being monitored and Timeout performed Patient Re-evaluated:Patient Re-evaluated prior to inductionOxygen Delivery Method: Circle system utilized Preoxygenation: Pre-oxygenation with 100% oxygen Intubation Type: IV induction and Cricoid Pressure applied Ventilation: Two handed mask ventilation required and Oral airway inserted - appropriate to patient size Laryngoscope Size: Glidescope and 4 Grade View: Grade III Tube type: Oral Tube size: 7.5 mm Number of attempts: 2 Airway Equipment and Method: Video-laryngoscopy Placement Confirmation: ETT inserted through vocal cords under direct vision and breath sounds checked- equal and bilateral Secured at: 22 cm Tube secured with: Tape Dental Injury: Teeth and Oropharynx as per pre-operative assessment  Difficulty Due To: Difficult Airway- due to reduced neck mobility, Difficult Airway- due to large tongue, Difficulty was anticipated, Difficult Airway- due to limited oral opening and Difficult Airway- due to anterior larynx Future Recommendations: Recommend- induction with short-acting agent, and alternative techniques readily available Comments: No intubation hx available.  DL once with Mac 4.  Unable to visualize cords with cricoid press/head lift.  To glidscope, mac 4, cords visualized.

## 2016-09-07 NOTE — Discharge Instructions (Signed)

## 2016-09-07 NOTE — Anesthesia Preprocedure Evaluation (Signed)
Anesthesia Evaluation  Patient identified by MRN, date of birth, ID band Patient awake    Reviewed: Allergy & Precautions, NPO status , Patient's Chart, lab work & pertinent test results  History of Anesthesia Complications Negative for: history of anesthetic complications  Airway Mallampati: III  TM Distance: <3 FB Neck ROM: Full    Dental  (+) Teeth Intact   Pulmonary    breath sounds clear to auscultation       Cardiovascular hypertension, Pt. on medications (-) angina+ Peripheral Vascular Disease  (-) Past MI  Rhythm:Regular  Neg stress 2005, denies CP or SOB with exertion    Neuro/Psych negative neurological ROS     GI/Hepatic negative GI ROS, Neg liver ROS,   Endo/Other  diabetes, Type 2, Oral Hypoglycemic AgentsMorbid obesity  Renal/GU Right renal mass     Musculoskeletal   Abdominal   Peds  Hematology   Anesthesia Other Findings   Reproductive/Obstetrics                             Anesthesia Physical Anesthesia Plan  ASA: II  Anesthesia Plan: General   Post-op Pain Management:    Induction: Intravenous  Airway Management Planned: Oral ETT and Video Laryngoscope Planned  Additional Equipment: None  Intra-op Plan:   Post-operative Plan: Extubation in OR  Informed Consent: I have reviewed the patients History and Physical, chart, labs and discussed the procedure including the risks, benefits and alternatives for the proposed anesthesia with the patient or authorized representative who has indicated his/her understanding and acceptance.   Dental advisory given  Plan Discussed with: CRNA and Surgeon  Anesthesia Plan Comments: (Possible difficult airway )        Anesthesia Quick Evaluation

## 2016-09-07 NOTE — H&P (Signed)
Renal Mass Surveillance  HPI: Michael Church is a 60 year-old male established patient who is here ongoing eval and management of a renal mass.  The mass is on the right side.   The lesion(s) was first noted on 12/29/2014. The mass was seen on CT Scan and MRI Scan.   The mass was last imaged 1 month. The patient recently underwent a MRI to re-evaluate the lesion in question. . It has grown 2.1x1.4x1.1 ( 1.9x1.7x.1.3 in 4/17).   He has had no symptoms. He has seen blood in his urine. He does have a good appetite. He is not having pain in new locations. He has not recently had unwanted weight loss.   He has not had previous abdominal surgery. The patient can walk a flight of steps.   The patient's past medical history is significant for diabetes. There is not a a family history of kidney cancer. There is no family history of brain tumors (AMLs), seizures or brain aneurysm's.   12/2014 MRI: complex cystic lesion in right upper pole 17x47mm  06/2015 MRI: complex renal lesion located centrally on the right kidney on the posterior aspect, endophytic. over six month interval it has grown from 17x33mm to 16x80mm. classified as a bonsiak 80F cyst.  12/22/15 MRI ([facility]): renal mass - solid appearing without enhancement: 1.9x1.7x1.3 cm    07/16/16 MRI ([facility]): right renal mass - 2.1x1.4x43mm with one small enhancing septation     ALLERGIES: Penicillins    MEDICATIONS: Aspirin Low Dose 81 MG TABS Oral  HydroCHLOROthiazide 12.5 MG Oral Tablet Oral  MetFORMIN HCl - 500 MG Oral Tablet Oral     GU PSH: None     PSH Notes: Knee Surgery   NON-GU PSH: None   GU PMH: Benign Neo Kidney, Unspec, Right, The patient has a right upper pole/posterior cystic lesionwith septal enhancement. Consistent with a Bosniak 80F cyst. There has been little interval growth. - 07/30/2016 Benign Neo Right Kidney, Renal benign neoplasm, right - 01/20/2016 Gross hematuria, Gross hematuria - 07/28/2015 Neoplasm of unspecified  behavior of unspecified kidney, Renal neoplasm - 2016 Urinary Tract Inf, Unspec site, Urinary tract infection - 2016    NON-GU PMH: Encounter for general adult medical examination without abnormal findings, Encounter for preventive health examination - 01/20/2016 Personal history of other diseases of the circulatory system, History of hypertension - 2016    FAMILY HISTORY: Hematuria - Runs In Family Kidney Stones - Runs In Family   SOCIAL HISTORY: Marital Status: Married Current Smoking Status: Patient has never smoked.      Notes: Father deceased, Number of children, Never a smoker, Mother deceased, Married   REVIEW OF SYSTEMS:    GU Review Male:   Patient reports get up at night to urinate and stream starts and stops. Patient denies frequent urination, hard to postpone urination, burning/ pain with urination, leakage of urine, trouble starting your stream, have to strain to urinate , erection problems, and penile pain.  Gastrointestinal (Upper):   Patient denies nausea, vomiting, and indigestion/ heartburn.  Gastrointestinal (Lower):   Patient denies constipation and diarrhea.  Constitutional:   Patient denies fever, night sweats, weight loss, and fatigue.  Skin:   Patient denies skin rash/ lesion and itching.  Eyes:   Patient denies blurred vision and double vision.  Ears/ Nose/ Throat:   Patient denies sore throat and sinus problems.  Hematologic/Lymphatic:   Patient denies swollen glands and easy bruising.  Cardiovascular:   Patient denies leg swelling and chest pains.  Respiratory:   Patient denies cough and shortness of breath.  Endocrine:   Patient denies excessive thirst.  Musculoskeletal:   Patient denies back pain and joint pain.  Neurological:   Patient denies headaches and dizziness.  Psychologic:   Patient denies depression and anxiety.   VITAL SIGNS:      08/23/2016 04:05 PM  BP 149/85 mmHg  Pulse 76 /min   MULTI-SYSTEM PHYSICAL EXAMINATION:    Constitutional:  Well-nourished. No physical deformities. Normally developed. Good grooming.  Neck: Neck symmetrical, not swollen. Normal tracheal position.  Respiratory: No labored breathing, no use of accessory muscles. clear to auscultation bilaterally  Cardiovascular: Normal temperature, normal extremity pulses, no swelling, no varicosities. regular rate and rhythm  Lymphatic: No enlargement of neck, axillae, groin.  Skin: No paleness, no jaundice, no cyanosis. No lesion, no ulcer, no rash.  Neurologic / Psychiatric: Oriented to time, oriented to place, oriented to person. No depression, no anxiety, no agitation.  Gastrointestinal: No mass, no tenderness, no rigidity, non obese abdomen. Soft, nondistended. No scars.  Eyes: Normal conjunctivae. Normal eyelids.  Ears, Nose, Mouth, and Throat: Left ear no scars, no lesions, no masses. Right ear no scars, no lesions, no masses. Nose no scars, no lesions, no masses. Normal hearing. Normal lips.  Musculoskeletal: Normal gait and station of head and neck.     PAST DATA REVIEWED:  Source Of History:  Patient  X-Ray Review: MRI Kidney: Reviewed Films.     PROCEDURES: None   ASSESSMENT:      ICD-10 Details  1 GU:   Benign Neo Kidney, Unspec - D30.00 Right, The patient has a complex cyst measuring approximately 2 cm on the posterior aspect of the right kidney in the interpolar region. It is completely endophytic.   PLAN:           Document Letter(s):  Created for Patient: Clinical Summary    The patient has been given the natural history of renal cancer, treatment options, and I recommended surgical extirpation for this patient. I went over the robotic-assisted laparoscopic partial nephrectomy approach. I described for the patient the procedure in detail including port placement. I detailed the postoperative course including the fact that the patient would have both a drain and a Foley catheter following the surgery. I told the patient that most often patients  are discharged on postoperative day one or 2. I then detailed the expected recovery time, I told the patient that he would not be able to lift anything greater than 20 pounds for 4 weeks. I also went over the risks and benefits of this operation in great detail. We discussed the risk of injury to surrounding structures, major blood vessels and nerves, bleeding, infection, loss of kidney, and the risk of recurrent cancer.         Notes:   Right Simple hilum: 1 artery, 1 vein. Completely endophytic posterior. Perihilar 2 cm complex cyst.   I discussed both the peritoneal and retroperitoneal laparoscopic approach with the patient. The patient is prepared for either one.

## 2016-09-07 NOTE — Transfer of Care (Signed)
Immediate Anesthesia Transfer of Care Note  Patient: Michael Church  Procedure(s) Performed: Procedure(s): XI ROBOTIC ASSITED LAPAROSCOPIC RIGHT PARTIAL NEPHRECTOMY (Right)  Patient Location: PACU  Anesthesia Type:General  Level of Consciousness: awake, oriented, patient cooperative, lethargic and responds to stimulation  Airway & Oxygen Therapy: Patient Spontanous Breathing and Patient connected to face mask oxygen  Post-op Assessment: Report given to RN, Post -op Vital signs reviewed and stable and Patient moving all extremities  Post vital signs: Reviewed and stable  Last Vitals:  Vitals:   09/07/16 0500  BP: (!) 139/94  Pulse: 70  Resp: 18  Temp: 36.5 C    Last Pain:  Vitals:   09/07/16 0500  TempSrc: Oral      Patients Stated Pain Goal: 4 ([id number])  Complications: No apparent anesthesia complications

## 2016-09-08 LAB — GLUCOSE, CAPILLARY
GLUCOSE-CAPILLARY: 101 mg/dL — AB (ref 65–99)
GLUCOSE-CAPILLARY: 106 mg/dL — AB (ref 65–99)
Glucose-Capillary: 113 mg/dL — ABNORMAL HIGH (ref 65–99)

## 2016-09-08 LAB — BASIC METABOLIC PANEL
ANION GAP: 9 (ref 5–15)
BUN: 15 mg/dL (ref 6–20)
CALCIUM: 8.8 mg/dL — AB (ref 8.9–10.3)
CO2: 27 mmol/L (ref 22–32)
Chloride: 102 mmol/L (ref 101–111)
Creatinine, Ser: 1.4 mg/dL — ABNORMAL HIGH (ref 0.61–1.24)
GFR, EST NON AFRICAN AMERICAN: 54 mL/min — AB (ref >60)
GLUCOSE: 108 mg/dL — AB (ref 65–99)
POTASSIUM: 4.3 mmol/L (ref 3.5–5.1)
SODIUM: 138 mmol/L (ref 135–145)

## 2016-09-08 LAB — HEMOGLOBIN AND HEMATOCRIT, BLOOD
HEMATOCRIT: 42.7 % (ref 39.0–52.0)
HEMOGLOBIN: 14.4 g/dL (ref 13.0–17.0)

## 2016-09-08 MED ORDER — TRAMADOL HCL 50 MG PO TABS
50.0000 mg | ORAL_TABLET | Freq: Four times a day (QID) | ORAL | Status: DC | PRN
  Administered 2016-09-08 (×2): 100 mg via ORAL
  Filled 2016-09-08 (×3): qty 2

## 2016-09-08 NOTE — Discharge Summary (Signed)
Date of admission: 09/07/2016  Date of discharge: 09/08/2016  Admission diagnosis: right cystic renal mass  Discharge diagnosis: same, s/p right robotic partial nephrectomy  Secondary diagnoses:  Patient Active Problem List   Diagnosis Date Noted  . BMI 34.0-34.9,adult 08/25/2016  . Type 2 diabetes mellitus without complications ([place]) 83/66/2947  . Renal mass 01/11/2015  . Hypertension 09/22/2012  . Varicose vein of leg 09/22/2012    History and Physical: For full details, please see admission history and physical. Briefly, Michael Church is a 60 y.o. year old patient with a small cystic renal mass.   Hospital Course: Patient tolerated the procedure well.  He was then transferred to the floor after an uneventful PACU stay.  His hospital course was uncomplicated.  On POD#1 he had met discharge criteria: was eating a regular diet, was up and ambulating independently,  pain was well controlled, was voiding without a catheter, and was ready to for discharge.   Laboratory values:   Recent Labs  09/07/16 1200 09/08/16 0520  HGB 14.4 14.4  HCT 43.0 42.7    Recent Labs  09/08/16 0520  NA 138  K 4.3  CL 102  CO2 27  GLUCOSE 108*  BUN 15  CREATININE 1.40*  CALCIUM 8.8*   No results for input(s): LABPT, INR in the last 72 hours. No results for input(s): LABURIN in the last 72 hours. Results for orders placed or performed in visit on 12/06/14  Urine culture     Status: None   Collection Time: 12/06/14  5:08 PM  Result Value Ref Range Status   Colony Count NO GROWTH  Final   Organism ID, Bacteria NO GROWTH  Final    Disposition: Home  Discharge instruction: The patient was instructed to be ambulatory but told to refrain from heavy lifting, strenuous activity, or driving.   Discharge medications: Allergies as of 09/08/2016      Reactions   Other Itching   Foods - cantaloupes, tomatoes, and  raw carrots.   Penicillins Itching   Has patient had a PCN reaction causing  immediate rash, facial/tongue/throat swelling, SOB or lightheadedness with hypotension: No Has patient had a PCN reaction causing severe rash involving mucus membranes or skin necrosis: No Has patient had a PCN reaction that required hospitalization: No Has patient had a PCN reaction occurring within the last 10 years: No If all of the above answers are "NO", then may proceed with Cephalosporin use.      Medication List    STOP taking these medications   ibuprofen 200 MG tablet Commonly known as:  ADVIL,MOTRIN     TAKE these medications   aspirin EC 81 MG tablet Take 81 mg by mouth daily.   cholecalciferol 1000 units tablet Commonly known as:  VITAMIN D Take 1,000 Units by mouth daily.   hydrochlorothiazide 12.5 MG capsule Commonly known as:  MICROZIDE TAKE 1 CAPSULE BY MOUTH DAILY   metFORMIN 500 MG tablet Commonly known as:  GLUCOPHAGE TAKE 1 TABLET(500 MG) BY MOUTH TWICE DAILY WITH A MEAL   multivitamin with minerals Tabs tablet Take 1 tablet by mouth daily.   sodium chloride 0.65 % Soln nasal spray Commonly known as:  OCEAN Place 1 spray into both nostrils every 6 (six) hours as needed for congestion (allergies).   traMADol 50 MG tablet Commonly known as:  ULTRAM Take 1-2 tablets (50-100 mg total) by mouth every 6 (six) hours as needed for moderate pain or severe pain.  Followup:  Follow-up Information    Ardis Hughs, MD Follow up on 09/21/2016.   Specialty:  Urology Why:  at 1:45 Contact information: [place] [id number] [phone]

## 2016-09-08 NOTE — Progress Notes (Signed)
Urology Inpatient Progress Report  right renal mass  Procedure(s): XI ROBOTIC ASSITED LAPAROSCOPIC RIGHT PARTIAL NEPHRECTOMY  1 Day Post-Op   Intv/Subj: No acute events overnight. Patient is without complaint. Foley d/c'd this AM  Active Problems:   Renal mass  Current Facility-Administered Medications  Medication Dose Route Frequency Provider Last Rate Last Dose  . 0.45 % sodium chloride infusion   Intravenous Continuous Debbrah Alar, PA-C 100 mL/hr at 09/08/16 0049    . acetaminophen (OFIRMEV) IV 1,000 mg  1,000 mg Intravenous Q6H Amanda Dancy, PA-C   1,000 mg at 09/08/16 0549  . acetaminophen (TYLENOL) tablet 650 mg  650 mg Oral Q4H PRN Debbrah Alar, PA-C      . diphenhydrAMINE (BENADRYL) injection 12.5-25 mg  12.5-25 mg Intravenous Q6H PRN Debbrah Alar, PA-C       Or  . diphenhydrAMINE (BENADRYL) 12.5 MG/5ML elixir 12.5-25 mg  12.5-25 mg Oral Q6H PRN Debbrah Alar, PA-C      . HYDROmorphone (DILAUDID) injection 0.5-1 mg  0.5-1 mg Intravenous Q2H PRN Debbrah Alar, PA-C   1 mg at 09/07/16 1629  . insulin aspart (novoLOG) injection 0-15 Units  0-15 Units Subcutaneous TID WC Debbrah Alar, PA-C   2 Units at 09/07/16 1722  . ondansetron (ZOFRAN) injection 4 mg  4 mg Intravenous Q4H PRN Debbrah Alar, PA-C      . traMADol Veatrice Bourbon) tablet 50-100 mg  50-100 mg Oral Q6H PRN Ardis Hughs, MD   100 mg at 09/08/16 0648     Objective: Vital: Vitals:   09/07/16 1757 09/07/16 2258 09/08/16 0257 09/08/16 0541  BP: 135/83 135/73 140/75 126/70  Pulse: 72 81 80 76  Resp: 16 18 16 16   Temp: 98.3 F (36.8 C) 98.6 F (37 C) 99.2 F (37.3 C) 98.6 F (37 C)  TempSrc: Axillary Oral Oral Oral  SpO2: 100% 100% 100% 100%  Weight:      Height:       I/Os: I/O last 3 completed shifts: In: 5271.7 [P.O.:840; I.V.:4331.7; IV Piggyback:100] Out: 2387 [Urine:2275; Drains:62; Blood:50]  Physical Exam:  General: Patient is in no apparent distress Lungs: Normal respiratory effort, chest  expands symmetrically. GI: Incisions are c/d/i.  The abdomen is soft and nontender without mass. JP drain with serosanguinous drainage Foley: out   Ext: lower extremities symmetric  Lab Results:  Recent Labs  09/07/16 1200 09/08/16 0520  HGB 14.4 14.4  HCT 43.0 42.7    Recent Labs  09/08/16 0520  NA 138  K 4.3  CL 102  CO2 27  GLUCOSE 108*  BUN 15  CREATININE 1.40*  CALCIUM 8.8*   No results for input(s): LABPT, INR in the last 72 hours. No results for input(s): LABURIN in the last 72 hours. Results for orders placed or performed in visit on 12/06/14  Urine culture     Status: None   Collection Time: 12/06/14  5:08 PM  Result Value Ref Range Status   Colony Count NO GROWTH  Final   Organism ID, Bacteria NO GROWTH  Final    Studies/Results: No results found.  Assessment: Procedure(s): XI ROBOTIC ASSITED LAPAROSCOPIC RIGHT PARTIAL NEPHRECTOMY, 1 Day Post-Op  doing well.  Plan: ADAT OOB/Ambulate HLIVF D/c JP Transition to oral pain meds Re-eval for d/c this PM   Louis Meckel, MD Urology 09/08/2016, 8:38 AM

## 2016-09-11 ENCOUNTER — Encounter (HOSPITAL_COMMUNITY): Payer: Self-pay | Admitting: Urology

## 2016-09-24 ENCOUNTER — Other Ambulatory Visit: Payer: Self-pay | Admitting: Urgent Care

## 2016-10-15 ENCOUNTER — Encounter: Payer: Self-pay | Admitting: Student

## 2016-10-15 DIAGNOSIS — C641 Malignant neoplasm of right kidney, except renal pelvis: Secondary | ICD-10-CM

## 2016-10-15 DIAGNOSIS — Z85528 Personal history of other malignant neoplasm of kidney: Secondary | ICD-10-CM | POA: Insufficient documentation

## 2017-01-25 ENCOUNTER — Ambulatory Visit (HOSPITAL_COMMUNITY)
Admission: RE | Admit: 2017-01-25 | Discharge: 2017-01-25 | Disposition: A | Discharge status: Home / Self Care | Payer: No Typology Code available for payment source | Source: Ambulatory Visit | Attending: Urology | Admitting: Urology

## 2017-01-25 ENCOUNTER — Other Ambulatory Visit: Payer: Self-pay | Admitting: Urology

## 2017-01-25 DIAGNOSIS — C642 Malignant neoplasm of left kidney, except renal pelvis: Secondary | ICD-10-CM | POA: Diagnosis not present

## 2017-03-13 ENCOUNTER — Encounter: Payer: Self-pay | Admitting: Hematology and Oncology

## 2017-03-13 ENCOUNTER — Telehealth: Payer: Self-pay | Admitting: Hematology and Oncology

## 2017-03-13 NOTE — Telephone Encounter (Signed)
Appt has been scheduled for the pt to see Dr. Alvy Bimler on 7/19 at 2pm. Letter mailed to the pt.

## 2017-04-04 ENCOUNTER — Telehealth: Payer: Self-pay | Admitting: Hematology and Oncology

## 2017-04-04 ENCOUNTER — Ambulatory Visit (HOSPITAL_BASED_OUTPATIENT_CLINIC_OR_DEPARTMENT_OTHER): Payer: No Typology Code available for payment source | Admitting: Hematology and Oncology

## 2017-04-04 ENCOUNTER — Ambulatory Visit (HOSPITAL_BASED_OUTPATIENT_CLINIC_OR_DEPARTMENT_OTHER): Payer: No Typology Code available for payment source

## 2017-04-04 ENCOUNTER — Encounter: Payer: Self-pay | Admitting: Hematology and Oncology

## 2017-04-04 VITALS — BP 150/102 | HR 77 | Temp 98.4°F | Resp 20 | Ht 71.0 in | Wt 241.5 lb

## 2017-04-04 DIAGNOSIS — I81 Portal vein thrombosis: Secondary | ICD-10-CM

## 2017-04-04 DIAGNOSIS — Z85528 Personal history of other malignant neoplasm of kidney: Secondary | ICD-10-CM

## 2017-04-04 DIAGNOSIS — C641 Malignant neoplasm of right kidney, except renal pelvis: Secondary | ICD-10-CM

## 2017-04-04 DIAGNOSIS — I10 Essential (primary) hypertension: Secondary | ICD-10-CM

## 2017-04-04 LAB — CBC WITH DIFFERENTIAL/PLATELET
BASO%: 0.4 % (ref 0.0–2.0)
Basophils Absolute: 0 10*3/uL (ref 0.0–0.1)
EOS%: 0.9 % (ref 0.0–7.0)
Eosinophils Absolute: 0 10*3/uL (ref 0.0–0.5)
HCT: 46.8 % (ref 38.4–49.9)
HGB: 15.6 g/dL (ref 13.0–17.1)
LYMPH%: 41 % (ref 14.0–49.0)
MCH: 28.7 pg (ref 27.2–33.4)
MCHC: 33.3 g/dL (ref 32.0–36.0)
MCV: 86.3 fL (ref 79.3–98.0)
MONO#: 0.4 10*3/uL (ref 0.1–0.9)
MONO%: 7.5 % (ref 0.0–14.0)
NEUT#: 2.6 10*3/uL (ref 1.5–6.5)
NEUT%: 50.2 % (ref 39.0–75.0)
Platelets: 223 10*3/uL (ref 140–400)
RBC: 5.42 10*6/uL (ref 4.20–5.82)
RDW: 12.6 % (ref 11.0–14.6)
WBC: 5.2 10*3/uL (ref 4.0–10.3)
lymph#: 2.1 10*3/uL (ref 0.9–3.3)

## 2017-04-04 LAB — COMPREHENSIVE METABOLIC PANEL
ALT: 27 U/L (ref 0–55)
AST: 26 U/L (ref 5–34)
Albumin: 4.2 g/dL (ref 3.5–5.0)
Alkaline Phosphatase: 52 U/L (ref 40–150)
Anion Gap: 11 mEq/L (ref 3–11)
BUN: 14.4 mg/dL (ref 7.0–26.0)
CO2: 29 mEq/L (ref 22–29)
Calcium: 10.1 mg/dL (ref 8.4–10.4)
Chloride: 100 mEq/L (ref 98–109)
Creatinine: 1.2 mg/dL (ref 0.7–1.3)
EGFR: 80 mL/min/{1.73_m2} — ABNORMAL LOW (ref >90)
Glucose: 79 mg/dl (ref 70–140)
Potassium: 3.9 mEq/L (ref 3.5–5.1)
Sodium: 140 mEq/L (ref 136–145)
Total Bilirubin: 0.87 mg/dL (ref 0.20–1.20)
Total Protein: 7.6 g/dL (ref 6.4–8.3)

## 2017-04-04 LAB — PROTIME-INR
INR: 1 — ABNORMAL LOW (ref 2.00–3.50)
Protime: 12 Seconds (ref 10.6–13.4)

## 2017-04-04 NOTE — Telephone Encounter (Signed)
Scheduled appt per 7/19 los - labs now - no additional apts added per los. Gave patient AVS

## 2017-04-04 NOTE — Assessment & Plan Note (Signed)
The patient has no known risk factor for isolated left portal vein thrombosis.  I suspect this coincide with possible surgical trauma.  The patient is not symptomatic. I recommend checking his liver function test but I suspect he is unlikely to have compromised liver function I do not believe he needs excessive workup for hypercoagulable state. The patient is taking aspirin. I will get his case presented at the next hematology tumor board and will discuss with him final recommendation.

## 2017-04-04 NOTE — Assessment & Plan Note (Signed)
He has a mildly elevated blood pressure today, likely due to anxiety The patient has mutliple cardiovascular risk factors We discussed dietary modification, importance of weight loss and exercise.

## 2017-04-04 NOTE — Progress Notes (Signed)
[name] CONSULT NOTE  Patient Care Team: Harrison Mons, PA-C as PCP - General (Family Medicine) Harold Hedge, Darrick Grinder, MD as Consulting Physician (Allergy and Immunology) Ardis Hughs, MD as Attending Physician (Urology)  CHIEF COMPLAINTS/PURPOSE OF CONSULTATION:  Incidental left portal vein thrombosis  HISTORY OF PRESENTING ILLNESS:  Michael Church 61 y.o. male is here because of recent finding of portal vein thrombosis. The patient is accompanied by his wife, Seth Bake today. This patient was found to have intermittent hematuria.  Subsequent evaluation revealed evidence of right kidney mass.  He subsequently underwent surgery which was uneventful.  Subsequent imaging study revealed evidence of portal vein thrombosis.  He is being referred here for further workup and management. I review his records extensively and summarized as follows:   History of kidney cancer   01/05/2015 Imaging    MR abdomen:  There is no discrete lesion in the lateral cortex of the right upper kidney as suggested on the recent CT scan. That appearance on CT was likely related to some subtle cortical/ capsular scarring and a prominent column.  A second lesion in the medial aspect of the right upper renal pole may be a septated cyst complicated by proteinaceous debris or hemorrhage. Some early postcontrast imaging raises the question of subtle irregular enhancement in the posterior aspect of the lesion. As such, followup MRI is recommended to confirm stability. Repeat exam in 3-6 months could be used to reassess.       07/27/2015 Imaging    MR abdomen 1. Complex cystic lesion of the right kidney upper pole, technically Bosniak category 43F (faint on measurable enhancement of several internal septations, with complex internal appearance but no obvious nodularity). This lesion has increased in volume by 70% over the last 7 months. Accordingly, I believe that resection or ablation is an option  given the increase in size. If surveillance is opted, I would suggest a six-month follow up MRI with and without contrast      09/07/2016 Surgery    He had robotic assisted laparoscopic right partial nephrectomy      09/07/2016 Pathology Results    Kidney, wedge excision / partial resection, right PAPILLARY RENAL CELL CARCINOMA, WHO NUCLEAR GRADE 3 (1.8 CM) THE TUMOR IS CONFINED TO THE KIDNEY (PT1A) ALL MARGINS OF RESECTION ARE NEGATIVE FOR CARCINOMA      01/25/2017 Imaging    Ct scan of abdomen suggest segmental liver atrophy suggestive of left portal vein thrombosis      He denies recent chest pain on exertion, shortness of breath on minimal exertion, pre-syncopal episodes, hemoptysis, or palpitation. He denies history of trauma, dehydration, or smoking. His age appropriate screening programs are up-to-date. He had prior surgeries before and never had perioperative thromboembolic events. He denies testosterone replacement therapy. There is no family history of blood clots except for stroke in his father, who died at the age of 54. He denies recent hematuria.  MEDICAL HISTORY:  Past Medical History:  Diagnosis Date  . Complication of anesthesia    "didn't numb well with local anesthesia with cervical spine surgery"  . Diabetes mellitus without complication ([place])    oral meds use   . Herniated lumbar intervertebral disc    not a problem presently  . Hypertension   . Prehypertension   . Renal mass, right    surgery planned 09-07-16    SURGICAL HISTORY: Past Surgical History:  Procedure Laterality Date  . CERVICAL SPINE SURGERY  10/2011  . left  knee torn meniscus  03/2011  . PARTIAL NEPHRECTOMY Right 09/07/2016  . ROBOTIC ASSITED PARTIAL NEPHRECTOMY Right 09/07/2016   Procedure: XI ROBOTIC ASSITED LAPAROSCOPIC RIGHT PARTIAL NEPHRECTOMY;  Surgeon: Ardis Hughs, MD;  Location: [facility];  Service: Urology;  Laterality: Right;  . TONSILLECTOMY    . VARICOSE VEIN SURGERY      bilateral    SOCIAL HISTORY: Social History   Social History  . Marital status: Married    Spouse name: Seth Bake  . Number of children: 2  . Years of education: N/A   Occupational History  . 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 Smoking status: Never Smoker  . Smokeless tobacco: Never Used     Comment: very rare use social  . Alcohol use 0.5 oz/week    1 Standard drinks or equivalent per week     Comment: OCCASIONALLY DRINK - BEER AND MIXED DRINKS  . Drug use: No  . Sexual activity: Yes   Other Topics Concern  . Not on file   Social History Narrative  . No narrative on file    FAMILY HISTORY: Family History  Problem Relation Age of Onset  . Stroke Father        age 17  . Cancer Brother        Hodgkin    ALLERGIES:  is allergic to other and penicillins.  MEDICATIONS:  Current Outpatient Prescriptions  Medication Sig Dispense Refill  . aspirin EC 81 MG tablet Take 81 mg by mouth daily.    . cholecalciferol (VITAMIN D) 1000 units tablet Take 1,000 Units by mouth daily.    . hydrochlorothiazide (MICROZIDE) 12.5 MG capsule TAKE 1 CAPSULE BY MOUTH DAILY 30 capsule 1  . metFORMIN (GLUCOPHAGE) 500 MG tablet TAKE 1 TABLET(500 MG) BY MOUTH TWICE DAILY WITH A MEAL 180 tablet 3  . Multiple Vitamin (MULTIVITAMIN WITH MINERALS) TABS Take 1 tablet by mouth daily.    . sodium chloride (OCEAN) 0.65 % SOLN nasal spray Place 1 spray into both nostrils every 6 (six) hours as needed for congestion (allergies).     No current facility-administered medications for this visit.     REVIEW OF SYSTEMS:   Constitutional: Denies fevers, chills or abnormal night sweats Eyes: Denies blurriness of vision, double vision or watery eyes Ears, nose, mouth, throat, and face: Denies mucositis or sore throat Respiratory: Denies cough, dyspnea or wheezes Cardiovascular: Denies palpitation, chest discomfort or lower extremity swelling Gastrointestinal:  Denies  nausea, heartburn or change in bowel habits Skin: Denies abnormal skin rashes Lymphatics: Denies new lymphadenopathy or easy bruising Neurological:Denies numbness, tingling or new weaknesses Behavioral/Psych: Mood is stable, no new changes  All other systems were reviewed with the patient and are negative.  PHYSICAL EXAMINATION: ECOG PERFORMANCE STATUS: 0 - Asymptomatic  Vitals:   04/04/17 1358  BP: (!) 150/102  Pulse: 77  Resp: 20  Temp: 98.4 F (36.9 C)   Filed Weights   04/04/17 1358  Weight: 241 lb 8 oz (109.5 kg)    GENERAL:alert, no distress and comfortable.  He is moderately obese SKIN: skin color, texture, turgor are normal, no rashes or significant lesions EYES: normal, conjunctiva are pink and non-injected, sclera clear OROPHARYNX:no exudate, no erythema and lips, buccal mucosa, and tongue normal  NECK: supple, thyroid normal size, non-tender, without nodularity LYMPH:  no palpable lymphadenopathy in the cervical, axillary or inguinal LUNGS: clear to auscultation and percussion with normal breathing effort HEART: regular rate &  rhythm and no murmurs and no lower extremity edema ABDOMEN:abdomen soft, non-tender and normal bowel sounds.  Well-healed surgical scar Musculoskeletal:no cyanosis of digits and no clubbing  PSYCH: alert & oriented x 3 with fluent speech NEURO: no focal motor/sensory deficits  LABORATORY DATA:  I have reviewed the data as listed Lab Results  Component Value Date   WBC 5.2 04/04/2017   HGB 15.6 04/04/2017   HCT 46.8 04/04/2017   MCV 86.3 04/04/2017   PLT 223 04/04/2017    Recent Labs  08/25/16 1028 09/08/16 0520 04/04/17 1540  NA 139 138 140  K 4.9 4.3 3.9  CL 99 102  --   CO2 24 27 29   GLUCOSE 91 108* 79  BUN 12 15 14.4  CREATININE 0.98 1.40* 1.2  CALCIUM 9.6 8.8* 10.1  GFRNONAA 85 54*  --   GFRAA 98 >60  --   PROT 7.1  --  7.6  ALBUMIN 4.5  --  4.2  AST 18  --  26  ALT 26  --  27  ALKPHOS 45  --  52  BILITOT 0.5  --   0.87    RADIOGRAPHIC STUDIES: I have personally reviewed the radiological images as listed and agreed with the findings in the report.  ASSESSMENT & PLAN:  Portal vein thrombosis The patient has no known risk factor for isolated left portal vein thrombosis.  I suspect this coincide with possible surgical trauma.  The patient is not symptomatic. I recommend checking his liver function test but I suspect he is unlikely to have compromised liver function I do not believe he needs excessive workup for hypercoagulable state. The patient is taking aspirin. I will get his case presented at the next hematology tumor board and will discuss with him final recommendation.  History of kidney cancer The patient has no signs of cancer recurrence on recent imaging He will continue follow-up with urologist  Essential hypertension He has a mildly elevated blood pressure today, likely due to anxiety The patient has mutliple cardiovascular risk factors We discussed dietary modification, importance of weight loss and exercise.  Orders Placed This Encounter  Procedures  . Comprehensive metabolic panel    Standing Status:   Future    Number of Occurrences:   1    Standing Expiration Date:   05/09/2018  . CBC with Differential/Platelet    Standing Status:   Future    Number of Occurrences:   1    Standing Expiration Date:   05/09/2018  . D-dimer, quantitative (not at [facility])    Standing Status:   Future    Number of Occurrences:   1    Standing Expiration Date:   05/09/2018  . Protime-INR    Standing Status:   Future    Number of Occurrences:   1    Standing Expiration Date:   05/09/2018  . APTT    Standing Status:   Future    Number of Occurrences:   1    Standing Expiration Date:   05/09/2018     All questions were answered. The patient knows to call the clinic with any problems, questions or concerns. I spent 55 minutes counseling the patient face to face. The total time spent in the  appointment was 60 minutes and more than 50% was on counseling.     Heath Lark, MD 04/04/2017 4:16 PM

## 2017-04-04 NOTE — Assessment & Plan Note (Signed)
The patient has no signs of cancer recurrence on recent imaging He will continue follow-up with urologist

## 2017-04-05 LAB — D-DIMER, QUANTITATIVE (NOT AT ARMC): D-DIMER: 0.22 mg{FEU}/L (ref 0.00–0.49)

## 2017-04-05 LAB — APTT: aPTT: 25 s (ref 24–33)

## 2017-04-08 ENCOUNTER — Telehealth: Payer: Self-pay | Admitting: *Deleted

## 2017-04-08 NOTE — Telephone Encounter (Signed)
LM with note below 

## 2017-04-08 NOTE — Telephone Encounter (Signed)
-----   Message from Heath Lark, MD sent at 04/07/2017 12:30 PM EDT ----- Regarding: all labs are OK Let him know all labs are OK ----- Message ----- From: Interface, Lab In Three Zero One Sent: 04/04/2017   3:53 PM To: Heath Lark, MD

## 2017-04-23 ENCOUNTER — Encounter: Payer: Self-pay | Admitting: Hematology and Oncology

## 2017-04-23 ENCOUNTER — Telehealth: Payer: Self-pay | Admitting: *Deleted

## 2017-04-23 ENCOUNTER — Telehealth: Payer: Self-pay | Admitting: Hematology and Oncology

## 2017-04-23 NOTE — Telephone Encounter (Signed)
The patient's case was discussed at the hematology tumor board conference this morning. I discussed final recommendation with the patient's wife.  I was not able to get hold of the patient today Overall, we felt that the abnormal CT imaging over the liver was likely due to reduced perfusion from portal vein thrombosis, likely induced around the time of prior surgery. No further workup is needed. His recent blood work indicated normal kidney function and normal liver function. He does not need long-term follow-up at the hematology clinic. The patient will continue routine follow-up with urologist regarding prior history of kidney cancer. I addressed all her questions and concerns

## 2017-04-23 NOTE — Telephone Encounter (Signed)
Letter faxed to Dr Louis Meckel

## 2017-09-03 ENCOUNTER — Ambulatory Visit: Payer: Self-pay | Admitting: General Surgery

## 2017-09-03 NOTE — H&P (Signed)
History of Present Illness Ralene Ok MD; 09/03/2017 11:54 AM) The patient is a 61 year old male who presents with a complaint of Mass. Patient is a 61 year old male who is referred by Dr. Lennette Bihari via for evaluation of a right gluteal mass a right paraspinal mass. He states that he's notices recently over the last 2-3 months. He states his gotten bigger and causing her more pain. He states that the pain occurs with sitting down. He states the pain is on and off throughout the day. There are no other modifying factors.  Patient had a previous posterior scalp mass recurred and required reexcision.   Past Surgical History Malachi Bonds, CMA; 09/03/2017 11:42 AM) Knee Surgery  Bilateral. Nephrectomy  Right. Tonsillectomy   Diagnostic Studies History Malachi Bonds, CMA; 09/03/2017 11:42 AM) Colonoscopy  5-10 years ago  Allergies Malachi Bonds, CMA; 09/03/2017 11:43 AM) Penicillins   Medication History Malachi Bonds, CMA; 09/03/2017 11:43 AM) MetFORMIN HCl (500MG  Tablet, Oral) Active. HydroCHLOROthiazide (12.5MG  Capsule, Oral) Active. Medications Reconciled  Social History Malachi Bonds, CMA; 09/03/2017 11:42 AM) Alcohol use  Occasional alcohol use. Caffeine use  Coffee, Tea. No drug use  Tobacco use  Never smoker.  Family History Malachi Bonds, CMA; 09/03/2017 11:42 AM) Cancer  Brother. Cerebrovascular Accident  Father.  Other Problems Malachi Bonds, CMA; 09/03/2017 11:42 AM) Cancer  Diabetes Mellitus  High blood pressure     Review of Systems Ralene Ok MD; 09/03/2017 11:53 AM) General Not Present- Appetite Loss, Chills, Fatigue, Fever, Night Sweats, Weight Gain and Weight Loss. Skin Not Present- Change in Wart/Mole, Dryness, Hives, Jaundice, New Lesions, Non-Healing Wounds, Rash and Ulcer. HEENT Present- Seasonal Allergies. Not Present- Earache, Hearing Loss, Hoarseness, Nose Bleed, Oral Ulcers, Ringing in the Ears, Sinus Pain, Sore  Throat, Visual Disturbances, Wears glasses/contact lenses and Yellow Eyes. Respiratory Present- Snoring. Not Present- Bloody sputum, Chronic Cough, Difficulty Breathing and Wheezing. Breast Not Present- Breast Mass, Breast Pain, Nipple Discharge and Skin Changes. Cardiovascular Not Present- Chest Pain, Difficulty Breathing Lying Down, Leg Cramps, Palpitations, Rapid Heart Rate, Shortness of Breath and Swelling of Extremities. Gastrointestinal Not Present- Abdominal Pain, Bloating, Bloody Stool, Change in Bowel Habits, Chronic diarrhea, Constipation, Difficulty Swallowing, Excessive gas, Gets full quickly at meals, Hemorrhoids, Indigestion, Nausea, Rectal Pain and Vomiting. Male Genitourinary Not Present- Blood in Urine, Change in Urinary Stream, Frequency, Impotence, Nocturia, Painful Urination, Urgency and Urine Leakage. Musculoskeletal Present- Joint Pain. Not Present- Back Pain, Joint Stiffness, Muscle Pain, Muscle Weakness and Swelling of Extremities. Neurological Not Present- Decreased Memory, Fainting, Headaches, Numbness, Seizures, Tingling, Tremor, Trouble walking and Weakness. Psychiatric Not Present- Anxiety, Bipolar, Change in Sleep Pattern, Depression, Fearful and Frequent crying. Hematology Not Present- Blood Thinners, Easy Bruising, Excessive bleeding, Gland problems, HIV and Persistent Infections. All other systems negative  Vitals (Chemira Jones CMA; 09/03/2017 11:43 AM) 09/03/2017 11:42 AM Weight: 245.5 lb Height: 71in Body Surface Area: 2.3 m Body Mass Index: 34.24 kg/m  Temp.: 98.39F(Oral)  Pulse: 66 (Regular)  BP: 126/80 (Sitting, Left Arm, Standard)       Physical Exam Ralene Ok MD; 09/03/2017 11:55 AM) The physical exam findings are as follows: Note:Constitutional: No acute distress, conversant, appears stated age  Eyes: Anicteric sclerae, moist conjunctiva, no lid lag  Neck: No thyromegaly, trachea midline, no cervical  lymphadenopathy  Lungs: Clear to auscultation biilaterally, normal respiratory effot  Cardiovascular: regular rate & rhythm, no murmurs, no peripheal edema, pedal pulses 2+  GI: Soft, no masses or hepatosplenomegaly, non-tender to palpation  MSK: Normal gait,  no clubbing cyanosis, edema  Skin: No rashes, palpation reveals normal skin turgor,right gluteal mass approximately 8 x 10 cm, subcutaneous, right paraspinous mid back mass approximately 5 x 3 cm, subcutaneous, mobile, nontender to palpation, nonerythematous  Psychiatric: Appropriate judgment and insight, oriented to person, place, and time ,    Assessment & Plan Ralene Ok MD; 09/03/2017 11:56 AM) MASS ON BACK (R22.2) Impression: 61 year old male with right midback mass, right gluteal mass 1. The patient likely proceeded operating room for excision of both masses. 2. Discussion and the risks and benefits of the procedure to include but not limited to: Infection, bleeding, damage to structures, possible recurrence. Patient was understanding and wished to proceed.

## 2017-09-03 NOTE — H&P (View-Only) (Signed)
History of Present Illness Ralene Ok MD; 09/03/2017 11:54 AM) The patient is a 61 year old male who presents with a complaint of Mass. Patient is a 61 year old male who is referred by Dr. Lennette Bihari via for evaluation of a right gluteal mass a right paraspinal mass. He states that he's notices recently over the last 2-3 months. He states his gotten bigger and causing her more pain. He states that the pain occurs with sitting down. He states the pain is on and off throughout the day. There are no other modifying factors.  Patient had a previous posterior scalp mass recurred and required reexcision.   Past Surgical History Malachi Bonds, CMA; 09/03/2017 11:42 AM) Knee Surgery  Bilateral. Nephrectomy  Right. Tonsillectomy   Diagnostic Studies History Malachi Bonds, CMA; 09/03/2017 11:42 AM) Colonoscopy  5-10 years ago  Allergies Malachi Bonds, CMA; 09/03/2017 11:43 AM) Penicillins   Medication History Malachi Bonds, CMA; 09/03/2017 11:43 AM) MetFORMIN HCl (500MG  Tablet, Oral) Active. HydroCHLOROthiazide (12.5MG  Capsule, Oral) Active. Medications Reconciled  Social History Malachi Bonds, CMA; 09/03/2017 11:42 AM) Alcohol use  Occasional alcohol use. Caffeine use  Coffee, Tea. No drug use  Tobacco use  Never smoker.  Family History Malachi Bonds, CMA; 09/03/2017 11:42 AM) Cancer  Brother. Cerebrovascular Accident  Father.  Other Problems Malachi Bonds, CMA; 09/03/2017 11:42 AM) Cancer  Diabetes Mellitus  High blood pressure     Review of Systems Ralene Ok MD; 09/03/2017 11:53 AM) General Not Present- Appetite Loss, Chills, Fatigue, Fever, Night Sweats, Weight Gain and Weight Loss. Skin Not Present- Change in Wart/Mole, Dryness, Hives, Jaundice, New Lesions, Non-Healing Wounds, Rash and Ulcer. HEENT Present- Seasonal Allergies. Not Present- Earache, Hearing Loss, Hoarseness, Nose Bleed, Oral Ulcers, Ringing in the Ears, Sinus Pain, Sore  Throat, Visual Disturbances, Wears glasses/contact lenses and Yellow Eyes. Respiratory Present- Snoring. Not Present- Bloody sputum, Chronic Cough, Difficulty Breathing and Wheezing. Breast Not Present- Breast Mass, Breast Pain, Nipple Discharge and Skin Changes. Cardiovascular Not Present- Chest Pain, Difficulty Breathing Lying Down, Leg Cramps, Palpitations, Rapid Heart Rate, Shortness of Breath and Swelling of Extremities. Gastrointestinal Not Present- Abdominal Pain, Bloating, Bloody Stool, Change in Bowel Habits, Chronic diarrhea, Constipation, Difficulty Swallowing, Excessive gas, Gets full quickly at meals, Hemorrhoids, Indigestion, Nausea, Rectal Pain and Vomiting. Male Genitourinary Not Present- Blood in Urine, Change in Urinary Stream, Frequency, Impotence, Nocturia, Painful Urination, Urgency and Urine Leakage. Musculoskeletal Present- Joint Pain. Not Present- Back Pain, Joint Stiffness, Muscle Pain, Muscle Weakness and Swelling of Extremities. Neurological Not Present- Decreased Memory, Fainting, Headaches, Numbness, Seizures, Tingling, Tremor, Trouble walking and Weakness. Psychiatric Not Present- Anxiety, Bipolar, Change in Sleep Pattern, Depression, Fearful and Frequent crying. Hematology Not Present- Blood Thinners, Easy Bruising, Excessive bleeding, Gland problems, HIV and Persistent Infections. All other systems negative  Vitals (Chemira Jones CMA; 09/03/2017 11:43 AM) 09/03/2017 11:42 AM Weight: 245.5 lb Height: 71in Body Surface Area: 2.3 m Body Mass Index: 34.24 kg/m  Temp.: 98.23F(Oral)  Pulse: 66 (Regular)  BP: 126/80 (Sitting, Left Arm, Standard)       Physical Exam Ralene Ok MD; 09/03/2017 11:55 AM) The physical exam findings are as follows: Note:Constitutional: No acute distress, conversant, appears stated age  Eyes: Anicteric sclerae, moist conjunctiva, no lid lag  Neck: No thyromegaly, trachea midline, no cervical  lymphadenopathy  Lungs: Clear to auscultation biilaterally, normal respiratory effot  Cardiovascular: regular rate & rhythm, no murmurs, no peripheal edema, pedal pulses 2+  GI: Soft, no masses or hepatosplenomegaly, non-tender to palpation  MSK: Normal gait,  no clubbing cyanosis, edema  Skin: No rashes, palpation reveals normal skin turgor,right gluteal mass approximately 8 x 10 cm, subcutaneous, right paraspinous mid back mass approximately 5 x 3 cm, subcutaneous, mobile, nontender to palpation, nonerythematous  Psychiatric: Appropriate judgment and insight, oriented to person, place, and time ,    Assessment & Plan Ralene Ok MD; 09/03/2017 11:56 AM) MASS ON BACK (R22.2) Impression: 61 year old male with right midback mass, right gluteal mass 1. The patient likely proceeded operating room for excision of both masses. 2. Discussion and the risks and benefits of the procedure to include but not limited to: Infection, bleeding, damage to structures, possible recurrence. Patient was understanding and wished to proceed.

## 2017-09-04 NOTE — Patient Instructions (Signed)
Michael Church  09/04/2017   Your procedure is scheduled on: 09-11-17  Report to [facility] Main  Entrance Take [place]  elevators to 3rd floor to  [place] at 6:30AM.    Call this number if you have problems the morning of surgery [phone]   Remember: ONLY 1 PERSON MAY GO WITH YOU TO SHORT STAY TO GET  READY MORNING OF [place].  Do not eat food or drink liquids :After Midnight.     Take these medicines the morning of surgery with A SIP OF WATER: NONE                                You may not have any metal on your body including hair pins and              piercings  Do not wear jewelry, make-up, lotions, powders or perfumes, deodorant                        Men may shave face and neck.   Do not bring valuables to the hospital. [place].  Contacts, dentures or bridgework may not be worn into surgery.      Patients discharged the day of surgery will not be allowed to drive home.  Name and phone number of your driver:  Special Instructions: N/A              Please read over the following fact sheets you were given: _____________________________________________________________________             [facility] - Preparing for Surgery Before surgery, you can play an important role.  Because skin is not sterile, your skin needs to be as free of germs as possible.  You can reduce the number of germs on your skin by washing with CHG (chlorahexidine gluconate) soap before surgery.  CHG is an antiseptic cleaner which kills germs and bonds with the skin to continue killing germs even after washing. Please DO NOT use if you have an allergy to CHG or antibacterial soaps.  If your skin becomes reddened/irritated stop using the CHG and inform your nurse when you arrive at Short Stay. Do not shave (including legs and underarms) for at least 48 hours prior to the first CHG shower.  You may shave  your face/neck. Please follow these instructions carefully:  1.  Shower with CHG Soap the night before surgery and the  morning of Surgery.  2.  If you choose to wash your hair, wash your hair first as usual with your  normal  shampoo.  3.  After you shampoo, rinse your hair and body thoroughly to remove the  shampoo.                           4.  Use CHG as you would any other liquid soap.  You can apply chg directly  to the skin and wash                       Gently with a scrungie or clean washcloth.  5.  Apply the CHG Soap to your body ONLY  FROM THE NECK DOWN.   Do not use on face/ open                           Wound or open sores. Avoid contact with eyes, ears mouth and genitals (private parts).                       Wash face,  Genitals (private parts) with your normal soap.             6.  Wash thoroughly, paying special attention to the area where your surgery  will be performed.  7.  Thoroughly rinse your body with warm water from the neck down.  8.  DO NOT shower/wash with your normal soap after using and rinsing off  the CHG Soap.                9.  Pat yourself dry with a clean towel.            10.  Wear clean pajamas.            11.  Place clean sheets on your bed the night of your first shower and do not  sleep with pets. Day of Surgery : Do not apply any lotions/deodorants the morning of surgery.  Please wear clean clothes to the hospital/surgery center.  FAILURE TO FOLLOW THESE INSTRUCTIONS MAY RESULT IN THE CANCELLATION OF YOUR SURGERY PATIENT SIGNATURE_________________________________  NURSE SIGNATURE__________________________________  ________________________________________________________________________    How to Manage Your Diabetes Before and After Surgery  Why is it important to control my blood sugar before and after surgery? . Improving blood sugar levels before and after surgery helps healing and can limit problems. . A way of improving blood sugar  control is eating a healthy diet by: o  Eating less sugar and carbohydrates o  Increasing activity/exercise o  Talking with your doctor about reaching your blood sugar goals . High blood sugars (greater than 180 mg/dL) can raise your risk of infections and slow your recovery, so you will need to focus on controlling your diabetes during the weeks before surgery. . Make sure that the doctor who takes care of your diabetes knows about your planned surgery including the date and location.  How do I manage my blood sugar before surgery? . Check your blood sugar at least 4 times a day, starting 2 days before surgery, to make sure that the level is not too high or low. o Check your blood sugar the morning of your surgery when you wake up and every 2 hours until you get to the Short Stay unit. . If your blood sugar is less than 70 mg/dL, you will need to treat for low blood sugar: o Do not take insulin. o Treat a low blood sugar (less than 70 mg/dL) with  cup of clear juice (cranberry or apple), 4 glucose tablets, OR glucose gel. o Recheck blood sugar in 15 minutes after treatment (to make sure it is greater than 70 mg/dL). If your blood sugar is not greater than 70 mg/dL on recheck, call [phone] for further instructions. . Report your blood sugar to the short stay nurse when you get to Short Stay.  . If you are admitted to the hospital after surgery: o Your blood sugar will be checked by the staff and you will probably be given insulin after surgery (instead of oral diabetes medicines) to make sure you have good  blood sugar levels. o The goal for blood sugar control after surgery is 80-180 mg/dL.   WHAT DO I DO ABOUT MY DIABETES MEDICATION?   . THE DAY BEFORE SURGERY, take METFORMIN as normal      . Do not take oral diabetes medicines (pills) the morning of surgery.    Patient Signature:  Date:   Nurse Signature:  Date:   Reviewed and Endorsed by [facility] Patient Education  Committee, August 2015

## 2017-09-04 NOTE — Progress Notes (Signed)
CXR 01-25-17 epic

## 2017-09-05 ENCOUNTER — Encounter (HOSPITAL_COMMUNITY)
Admission: RE | Admit: 2017-09-05 | Discharge: 2017-09-05 | Disposition: A | Discharge status: Home / Self Care | Payer: No Typology Code available for payment source | Source: Ambulatory Visit | Attending: General Surgery | Admitting: General Surgery

## 2017-09-05 ENCOUNTER — Encounter (INDEPENDENT_AMBULATORY_CARE_PROVIDER_SITE_OTHER): Payer: Self-pay

## 2017-09-05 ENCOUNTER — Other Ambulatory Visit: Payer: Self-pay

## 2017-09-05 ENCOUNTER — Encounter (HOSPITAL_COMMUNITY): Payer: Self-pay

## 2017-09-05 DIAGNOSIS — E119 Type 2 diabetes mellitus without complications: Secondary | ICD-10-CM | POA: Insufficient documentation

## 2017-09-05 DIAGNOSIS — Z79899 Other long term (current) drug therapy: Secondary | ICD-10-CM | POA: Insufficient documentation

## 2017-09-05 DIAGNOSIS — Z7982 Long term (current) use of aspirin: Secondary | ICD-10-CM | POA: Diagnosis not present

## 2017-09-05 DIAGNOSIS — R222 Localized swelling, mass and lump, trunk: Secondary | ICD-10-CM | POA: Diagnosis not present

## 2017-09-05 DIAGNOSIS — I10 Essential (primary) hypertension: Secondary | ICD-10-CM | POA: Insufficient documentation

## 2017-09-05 DIAGNOSIS — Z01818 Encounter for other preprocedural examination: Secondary | ICD-10-CM | POA: Insufficient documentation

## 2017-09-05 DIAGNOSIS — Z7984 Long term (current) use of oral hypoglycemic drugs: Secondary | ICD-10-CM | POA: Insufficient documentation

## 2017-09-05 DIAGNOSIS — Z01810 Encounter for preprocedural cardiovascular examination: Secondary | ICD-10-CM | POA: Insufficient documentation

## 2017-09-05 HISTORY — DX: Inflammatory liver disease, unspecified: K75.9

## 2017-09-05 LAB — COMPREHENSIVE METABOLIC PANEL
ALBUMIN: 4.2 g/dL (ref 3.5–5.0)
ALT: 25 U/L (ref 17–63)
AST: 27 U/L (ref 15–41)
Alkaline Phosphatase: 45 U/L (ref 38–126)
Anion gap: 8 (ref 5–15)
BUN: 17 mg/dL (ref 6–20)
CHLORIDE: 99 mmol/L — AB (ref 101–111)
CO2: 28 mmol/L (ref 22–32)
CREATININE: 1 mg/dL (ref 0.61–1.24)
Calcium: 9.6 mg/dL (ref 8.9–10.3)
GFR calc non Af Amer: >60 mL/min (ref >60)
Glucose, Bld: 82 mg/dL (ref 65–99)
Potassium: 4 mmol/L (ref 3.5–5.1)
SODIUM: 135 mmol/L (ref 135–145)
Total Bilirubin: 0.9 mg/dL (ref 0.3–1.2)
Total Protein: 7.3 g/dL (ref 6.5–8.1)

## 2017-09-05 LAB — CBC
HCT: 44.8 % (ref 39.0–52.0)
Hemoglobin: 15 g/dL (ref 13.0–17.0)
MCH: 28.8 pg (ref 26.0–34.0)
MCHC: 33.5 g/dL (ref 30.0–36.0)
MCV: 86.2 fL (ref 78.0–100.0)
PLATELETS: 234 10*3/uL (ref 150–400)
RBC: 5.2 MIL/uL (ref 4.22–5.81)
RDW: 12.6 % (ref 11.5–15.5)
WBC: 4.8 10*3/uL (ref 4.0–10.5)

## 2017-09-05 LAB — GLUCOSE, CAPILLARY: GLUCOSE-CAPILLARY: 78 mg/dL (ref 65–99)

## 2017-09-06 LAB — HEMOGLOBIN A1C
Hgb A1c MFr Bld: 6 % — ABNORMAL HIGH (ref 4.8–5.6)
MEAN PLASMA GLUCOSE: 125.5 mg/dL

## 2017-09-10 MED ORDER — VANCOMYCIN HCL 10 G IV SOLR
1500.0000 mg | INTRAVENOUS | Status: AC
  Administered 2017-09-11: 1500 mg via INTRAVENOUS
  Filled 2017-09-10: qty 1500

## 2017-09-10 NOTE — Anesthesia Preprocedure Evaluation (Signed)
Anesthesia Evaluation  Patient identified by MRN, date of birth, ID band Patient awake    Reviewed: Allergy & Precautions, NPO status , Patient's Chart, lab work & pertinent test results  History of Anesthesia Complications Negative for: history of anesthetic complications  Airway Mallampati: III  TM Distance: <3 FB Neck ROM: Full    Dental  (+) Teeth Intact   Pulmonary    breath sounds clear to auscultation       Cardiovascular hypertension, Pt. on medications (-) angina+ Peripheral Vascular Disease  (-) Past MI  Rhythm:Regular  Neg stress 2005, denies CP or SOB with exertion    Neuro/Psych negative neurological ROS     GI/Hepatic negative GI ROS, (+) Hepatitis -  Endo/Other  diabetes, Type 2, Oral Hypoglycemic AgentsMorbid obesity  Renal/GU Right renal mass     Musculoskeletal   Abdominal   Peds  Hematology   Anesthesia Other Findings   Reproductive/Obstetrics                             Anesthesia Physical  Anesthesia Plan  ASA: II  Anesthesia Plan: General   Post-op Pain Management:    Induction: Intravenous  PONV Risk Score and Plan:   Airway Management Planned: LMA and Oral ETT  Additional Equipment: None  Intra-op Plan:   Post-operative Plan: Extubation in OR  Informed Consent: I have reviewed the patients History and Physical, chart, labs and discussed the procedure including the risks, benefits and alternatives for the proposed anesthesia with the patient or authorized representative who has indicated his/her understanding and acceptance.   Dental advisory given  Plan Discussed with: CRNA  Anesthesia Plan Comments: (Possible difficult airway )        Anesthesia Quick Evaluation

## 2017-09-11 ENCOUNTER — Encounter (HOSPITAL_COMMUNITY): Payer: Self-pay | Admitting: Emergency Medicine

## 2017-09-11 ENCOUNTER — Encounter (HOSPITAL_COMMUNITY)
Admission: RE | Disposition: A | Discharge status: Home / Self Care | Payer: Self-pay | Source: Ambulatory Visit | Attending: General Surgery

## 2017-09-11 ENCOUNTER — Ambulatory Visit (HOSPITAL_COMMUNITY)
Admission: RE | Admit: 2017-09-11 | Discharge: 2017-09-11 | Disposition: A | Discharge status: Home / Self Care | Payer: No Typology Code available for payment source | Source: Ambulatory Visit | Attending: General Surgery | Admitting: General Surgery

## 2017-09-11 ENCOUNTER — Ambulatory Visit (HOSPITAL_COMMUNITY): Payer: No Typology Code available for payment source | Admitting: Anesthesiology

## 2017-09-11 ENCOUNTER — Other Ambulatory Visit: Payer: Self-pay

## 2017-09-11 DIAGNOSIS — Z79899 Other long term (current) drug therapy: Secondary | ICD-10-CM | POA: Diagnosis not present

## 2017-09-11 DIAGNOSIS — I10 Essential (primary) hypertension: Secondary | ICD-10-CM | POA: Diagnosis not present

## 2017-09-11 DIAGNOSIS — I739 Peripheral vascular disease, unspecified: Secondary | ICD-10-CM | POA: Insufficient documentation

## 2017-09-11 DIAGNOSIS — Z6833 Body mass index (BMI) 33.0-33.9, adult: Secondary | ICD-10-CM | POA: Insufficient documentation

## 2017-09-11 DIAGNOSIS — Z823 Family history of stroke: Secondary | ICD-10-CM | POA: Diagnosis not present

## 2017-09-11 DIAGNOSIS — Z880 Allergy status to penicillin: Secondary | ICD-10-CM | POA: Insufficient documentation

## 2017-09-11 DIAGNOSIS — D171 Benign lipomatous neoplasm of skin and subcutaneous tissue of trunk: Secondary | ICD-10-CM | POA: Diagnosis not present

## 2017-09-11 DIAGNOSIS — Z809 Family history of malignant neoplasm, unspecified: Secondary | ICD-10-CM | POA: Diagnosis not present

## 2017-09-11 DIAGNOSIS — E119 Type 2 diabetes mellitus without complications: Secondary | ICD-10-CM | POA: Insufficient documentation

## 2017-09-11 DIAGNOSIS — Z905 Acquired absence of kidney: Secondary | ICD-10-CM | POA: Diagnosis not present

## 2017-09-11 DIAGNOSIS — R222 Localized swelling, mass and lump, trunk: Secondary | ICD-10-CM | POA: Diagnosis present

## 2017-09-11 DIAGNOSIS — Z8719 Personal history of other diseases of the digestive system: Secondary | ICD-10-CM | POA: Diagnosis not present

## 2017-09-11 DIAGNOSIS — Z7984 Long term (current) use of oral hypoglycemic drugs: Secondary | ICD-10-CM | POA: Diagnosis not present

## 2017-09-11 HISTORY — PX: MASS EXCISION: SHX2000

## 2017-09-11 LAB — GLUCOSE, CAPILLARY
GLUCOSE-CAPILLARY: 94 mg/dL (ref 65–99)
Glucose-Capillary: 91 mg/dL (ref 65–99)

## 2017-09-11 SURGERY — MINOR EXCISION OF MASS
Anesthesia: General | Site: Back

## 2017-09-11 MED ORDER — OXYCODONE HCL 5 MG PO TABS: 5.0000 mg | ORAL_TABLET | Freq: Once | ORAL | Status: DC | PRN

## 2017-09-11 MED ORDER — OXYCODONE HCL 5 MG PO TABS
5.0000 mg | ORAL_TABLET | ORAL | Status: DC | PRN
  Administered 2017-09-11: 5 mg via ORAL
  Filled 2017-09-11: qty 1

## 2017-09-11 MED ORDER — MEPERIDINE HCL 50 MG/ML IJ SOLN: 6.2500 mg | INTRAMUSCULAR | Status: DC | PRN

## 2017-09-11 MED ORDER — SUGAMMADEX SODIUM 500 MG/5ML IV SOLN
INTRAVENOUS | Status: AC
  Filled 2017-09-11: qty 5

## 2017-09-11 MED ORDER — LIDOCAINE 2% (20 MG/ML) 5 ML SYRINGE
INTRAMUSCULAR | Status: AC
  Filled 2017-09-11: qty 5

## 2017-09-11 MED ORDER — LACTATED RINGERS IV SOLN
INTRAVENOUS | Status: DC
  Administered 2017-09-11: 07:00:00 via INTRAVENOUS

## 2017-09-11 MED ORDER — DEXAMETHASONE SODIUM PHOSPHATE 10 MG/ML IJ SOLN
INTRAMUSCULAR | Status: DC | PRN
  Administered 2017-09-11: 10 mg via INTRAVENOUS

## 2017-09-11 MED ORDER — FENTANYL CITRATE (PF) 250 MCG/5ML IJ SOLN
INTRAMUSCULAR | Status: AC
  Filled 2017-09-11: qty 5

## 2017-09-11 MED ORDER — OXYCODONE HCL 5 MG/5ML PO SOLN
5.0000 mg | Freq: Once | ORAL | Status: DC | PRN
  Filled 2017-09-11: qty 5

## 2017-09-11 MED ORDER — PROMETHAZINE HCL 25 MG/ML IJ SOLN: 6.2500 mg | INTRAMUSCULAR | Status: DC | PRN

## 2017-09-11 MED ORDER — FENTANYL CITRATE (PF) 100 MCG/2ML IJ SOLN: 25.0000 ug | INTRAMUSCULAR | Status: DC | PRN

## 2017-09-11 MED ORDER — MIDAZOLAM HCL 5 MG/5ML IJ SOLN
INTRAMUSCULAR | Status: DC | PRN
  Administered 2017-09-11: 2 mg via INTRAVENOUS

## 2017-09-11 MED ORDER — PROPOFOL 10 MG/ML IV BOLUS
INTRAVENOUS | Status: AC
  Filled 2017-09-11: qty 20

## 2017-09-11 MED ORDER — ONDANSETRON HCL 4 MG/2ML IJ SOLN
INTRAMUSCULAR | Status: AC
  Filled 2017-09-11: qty 2

## 2017-09-11 MED ORDER — CHLORHEXIDINE GLUCONATE CLOTH 2 % EX PADS: 6.0000 | MEDICATED_PAD | Freq: Once | CUTANEOUS | Status: DC

## 2017-09-11 MED ORDER — PHENYLEPHRINE 40 MCG/ML (10ML) SYRINGE FOR IV PUSH (FOR BLOOD PRESSURE SUPPORT)
PREFILLED_SYRINGE | INTRAVENOUS | Status: DC | PRN
  Administered 2017-09-11: 80 ug via INTRAVENOUS
  Administered 2017-09-11: 40 ug via INTRAVENOUS

## 2017-09-11 MED ORDER — PHENYLEPHRINE 40 MCG/ML (10ML) SYRINGE FOR IV PUSH (FOR BLOOD PRESSURE SUPPORT)
PREFILLED_SYRINGE | INTRAVENOUS | Status: AC
  Filled 2017-09-11: qty 10

## 2017-09-11 MED ORDER — SUGAMMADEX SODIUM 500 MG/5ML IV SOLN
INTRAVENOUS | Status: DC | PRN
  Administered 2017-09-11: 220 mg via INTRAVENOUS

## 2017-09-11 MED ORDER — DEXAMETHASONE SODIUM PHOSPHATE 10 MG/ML IJ SOLN
INTRAMUSCULAR | Status: AC
  Filled 2017-09-11: qty 1

## 2017-09-11 MED ORDER — MIDAZOLAM HCL 2 MG/2ML IJ SOLN
INTRAMUSCULAR | Status: AC
  Filled 2017-09-11: qty 2

## 2017-09-11 MED ORDER — PROPOFOL 10 MG/ML IV BOLUS
INTRAVENOUS | Status: DC | PRN
  Administered 2017-09-11: 170 mg via INTRAVENOUS

## 2017-09-11 MED ORDER — ONDANSETRON HCL 4 MG/2ML IJ SOLN
INTRAMUSCULAR | Status: DC | PRN
  Administered 2017-09-11: 4 mg via INTRAVENOUS

## 2017-09-11 MED ORDER — LIDOCAINE HCL (PF) 1 % IJ SOLN
INTRAMUSCULAR | Status: AC
  Filled 2017-09-11: qty 30

## 2017-09-11 MED ORDER — SODIUM CHLORIDE 0.9 % IR SOLN
Status: DC | PRN
  Administered 2017-09-11: 1000 mL

## 2017-09-11 MED ORDER — ROCURONIUM BROMIDE 50 MG/5ML IV SOSY
PREFILLED_SYRINGE | INTRAVENOUS | Status: AC
  Filled 2017-09-11: qty 5

## 2017-09-11 MED ORDER — ROCURONIUM BROMIDE 10 MG/ML (PF) SYRINGE
PREFILLED_SYRINGE | INTRAVENOUS | Status: DC | PRN
  Administered 2017-09-11: 45 mg via INTRAVENOUS
  Administered 2017-09-11: 5 mg via INTRAVENOUS

## 2017-09-11 MED ORDER — BUPIVACAINE-EPINEPHRINE 0.25% -1:200000 IJ SOLN
INTRAMUSCULAR | Status: AC
  Filled 2017-09-11: qty 1

## 2017-09-11 MED ORDER — LIDOCAINE 2% (20 MG/ML) 5 ML SYRINGE
INTRAMUSCULAR | Status: DC | PRN
  Administered 2017-09-11: 50 mg via INTRAVENOUS

## 2017-09-11 MED ORDER — OXYCODONE-ACETAMINOPHEN 5-325 MG PO TABS: 1.0000 | ORAL_TABLET | Freq: Four times a day (QID) | ORAL | 0 refills | Status: AC | PRN

## 2017-09-11 MED ORDER — SUCCINYLCHOLINE CHLORIDE 200 MG/10ML IV SOSY
PREFILLED_SYRINGE | INTRAVENOUS | Status: AC
  Filled 2017-09-11: qty 10

## 2017-09-11 MED ORDER — BUPIVACAINE-EPINEPHRINE 0.25% -1:200000 IJ SOLN
INTRAMUSCULAR | Status: DC | PRN
Start: 2017-09-11 — End: 2017-09-11
  Administered 2017-09-11: 20 mL

## 2017-09-11 MED ORDER — SUCCINYLCHOLINE CHLORIDE 200 MG/10ML IV SOSY
PREFILLED_SYRINGE | INTRAVENOUS | Status: DC | PRN
  Administered 2017-09-11: 180 mg via INTRAVENOUS

## 2017-09-11 MED ORDER — FENTANYL CITRATE (PF) 100 MCG/2ML IJ SOLN
INTRAMUSCULAR | Status: DC | PRN
  Administered 2017-09-11: 100 ug via INTRAVENOUS
  Administered 2017-09-11: 50 ug via INTRAVENOUS

## 2017-09-11 SURGICAL SUPPLY — 36 items
BENZOIN TINCTURE PRP APPL 2/3 (GAUZE/BANDAGES/DRESSINGS) IMPLANT
BLADE HEX COATED 2.75 (ELECTRODE) IMPLANT
BLADE SURG SZ10 CARB STEEL (BLADE) ×2 IMPLANT
DECANTER SPIKE VIAL GLASS SM (MISCELLANEOUS) ×2 IMPLANT
DERMABOND ADVANCED (GAUZE/BANDAGES/DRESSINGS) ×2
DERMABOND ADVANCED .7 DNX12 (GAUZE/BANDAGES/DRESSINGS) ×2 IMPLANT
DRAPE LAPAROSCOPIC ABDOMINAL (DRAPES) ×2 IMPLANT
DRAPE LAPAROTOMY T 102X78X121 (DRAPES) IMPLANT
DRAPE LAPAROTOMY TRNSV 102X78 (DRAPE) IMPLANT
DRAPE SHEET LG 3/4 BI-LAMINATE (DRAPES) IMPLANT
ELECT PENCIL ROCKER SW 15FT (MISCELLANEOUS) ×2 IMPLANT
ELECT REM PT RETURN 15FT ADLT (MISCELLANEOUS) ×2 IMPLANT
EVACUATOR SILICONE 100CC (DRAIN) IMPLANT
GAUZE SPONGE 4X4 12PLY STRL (GAUZE/BANDAGES/DRESSINGS) ×2 IMPLANT
GLOVE BIO SURGEON STRL SZ7.5 (GLOVE) ×4 IMPLANT
GLOVE BIOGEL PI IND STRL 7.0 (GLOVE) ×1 IMPLANT
GLOVE BIOGEL PI INDICATOR 7.0 (GLOVE) ×1
GOWN STRL REUS W/ TWL XL LVL3 (GOWN DISPOSABLE) ×1 IMPLANT
GOWN STRL REUS W/TWL LRG LVL3 (GOWN DISPOSABLE) ×2 IMPLANT
GOWN STRL REUS W/TWL XL LVL3 (GOWN DISPOSABLE) ×3 IMPLANT
KIT BASIN OR (CUSTOM PROCEDURE TRAY) ×2 IMPLANT
MARKER SKIN DUAL TIP RULER LAB (MISCELLANEOUS) ×2 IMPLANT
NEEDLE HYPO 25X1 1.5 SAFETY (NEEDLE) ×2 IMPLANT
NS IRRIG 1000ML POUR BTL (IV SOLUTION) ×2 IMPLANT
PACK BASIC VI WITH GOWN DISP (CUSTOM PROCEDURE TRAY) ×2 IMPLANT
SOL PREP POV-IOD 4OZ 10% (MISCELLANEOUS) IMPLANT
SPONGE LAP 18X18 X RAY DECT (DISPOSABLE) ×2 IMPLANT
SPONGE LAP 4X18 X RAY DECT (DISPOSABLE) IMPLANT
STAPLER VISISTAT 35W (STAPLE) IMPLANT
SUT MNCRL AB 3-0 PS2 18 (SUTURE) ×4 IMPLANT
SUT MNCRL AB 4-0 PS2 18 (SUTURE) IMPLANT
SUT VIC AB 3-0 SH 18 (SUTURE) ×4 IMPLANT
SYR CONTROL 10ML LL (SYRINGE) ×2 IMPLANT
TOWEL OR 17X26 10 PK STRL BLUE (TOWEL DISPOSABLE) ×2 IMPLANT
WATER STERILE IRR 1000ML POUR (IV SOLUTION) ×2 IMPLANT
YANKAUER SUCT BULB TIP 10FT TU (MISCELLANEOUS) ×2 IMPLANT

## 2017-09-11 NOTE — Transfer of Care (Signed)
Immediate Anesthesia Transfer of Care Note  Patient: Michael Church  Procedure(s) Performed: Procedure(s): MINOR EXCISION OF BACK MASS, EXCISION OF GLUTEAL MASS (N/A)  Patient Location: PACU  Anesthesia Type:General  Level of Consciousness:  sedated, patient cooperative and responds to stimulation  Airway & Oxygen Therapy:Patient Spontanous Breathing and Patient connected to face mask oxgen  Post-op Assessment:  Report given to PACU RN and Post -op Vital signs reviewed and stable  Post vital signs:  Reviewed and stable  Last Vitals:  Vitals:   09/11/17 0635  BP: (!) 141/92  Pulse: 71  Resp: 16  Temp: 36.7 C  SpO2: 14%    Complications: No apparent anesthesia complications

## 2017-09-11 NOTE — Anesthesia Procedure Notes (Signed)
Procedure Name: Intubation Date/Time: 09/11/2017 8:32 AM Performed by: Anne Fu, CRNA Pre-anesthesia Checklist: Patient identified, Emergency Drugs available, Suction available, Patient being monitored and Timeout performed Patient Re-evaluated:Patient Re-evaluated prior to induction Oxygen Delivery Method: Circle system utilized Preoxygenation: Pre-oxygenation with 100% oxygen Induction Type: IV induction Ventilation: Mask ventilation without difficulty Laryngoscope Size: Mac and 4 Grade View: Grade II Tube type: Oral Tube size: 7.5 mm Number of attempts: 1 Airway Equipment and Method: Stylet Placement Confirmation: ETT inserted through vocal cords under direct vision,  positive ETCO2 and breath sounds checked- equal and bilateral Secured at: 21 cm Tube secured with: Tape Dental Injury: Teeth and Oropharynx as per pre-operative assessment

## 2017-09-11 NOTE — Progress Notes (Signed)
Discharge instructions reviewed with wife andrea and pt, both verbalize understanding of instructions.  Told to call to make follow up appt in 2 weeks. Instructed to call office and/or go to ER for any emergent symptoms or concerns.  Pt back incision and right buttock incision clean dry and intact at discharge with no presence of drainage, skin glue is approximating edges.

## 2017-09-11 NOTE — Interval H&P Note (Signed)
History and Physical Interval Note:  09/11/2017 8:00 AM  Michael Church  has presented today for surgery, with the diagnosis of BACK MASS, GLUTEAL MASS  The various methods of treatment have been discussed with the patient and family. After consideration of risks, benefits and other options for treatment, the patient has consented to  Procedure(s): MINOR EXCISION OF BACK MASS, EXCISION OF GLUTEAL MASS (N/A) as a surgical intervention .  The patient's history has been reviewed, patient examined, no change in status, stable for surgery.  I have reviewed the patient's chart and labs.  Questions were answered to the patient's satisfaction.     Rosario Jacks., Anne Hahn

## 2017-09-11 NOTE — Discharge Instructions (Signed)
Lipoma Removal, Care After °Refer to this sheet in the next few weeks. These instructions provide you with information about caring for yourself after your procedure. Your health care provider may also give you more specific instructions. Your treatment has been planned according to current medical practices, but problems sometimes occur. Call your health care provider if you have any problems or questions after your procedure. °What can I expect after the procedure? °After the procedure, it is common to have: °· Mild pain. °· Swelling. °· Bruising. ° °Follow these instructions at home: ° °Bathing °· Do not take baths, swim, or use a hot tub until your health care provider approves. Ask your health care provider if you can take showers. You may only be allowed to take sponge baths for bathing. °· Keep your bandage (dressing) dry until your health care provider says it can be removed. °Incision care ° °· Follow instructions from your health care provider about how to take care of your incision. Make sure you: °? Wash your hands with soap and water before you change your bandage (dressing). If soap and water are not available, use hand sanitizer. °? Change your dressing as told by your health care provider. °? Leave stitches (sutures), skin glue, or adhesive strips in place. These skin closures may need to stay in place for 2 weeks or longer. If adhesive strip edges start to loosen and curl up, you may trim the loose edges. Do not remove adhesive strips completely unless your health care provider tells you to do that. °· Check your incision area every day for signs of infection. Check for: °? More redness, swelling, or pain. °? Fluid or blood. °? Warmth. °? Pus or a bad smell. °Driving °· Do not drive or operate heavy machinery while taking prescription pain medicine. °· Do not drive for 24 hours if you received a medicine to help you relax (sedative) during your procedure. °· Ask your health care provider when it is  safe for you to drive. °General instructions °· Take over-the-counter and prescription medicines only as told by your health care provider. °· Do not use any tobacco products, such as cigarettes, chewing tobacco, and e-cigarettes. These can delay healing. If you need help quitting, ask your health care provider. °· Return to your normal activities as told by your health care provider. Ask your health care provider what activities are safe for you. °· Keep all follow-up visits as told by your health care provider. This is important. °Contact a health care provider if: °· You have more redness, swelling, or pain around your incision. °· You have fluid or blood coming from your incision. °· Your incision feels warm to the touch. °· You have pus or a bad smell coming from your incision. °· You have pain that does not get better with medicine. °Get help right away if: °· You have chills or a fever. °· You have severe pain. °This information is not intended to replace advice given to you by your health care provider. Make sure you discuss any questions you have with your health care provider. °Document Released: 11/17/2015 Document Revised: 02/14/2016 Document Reviewed: 11/17/2015 °Elsevier Interactive Patient Education © 2018 [employer] ° ° ° °General Anesthesia, Adult, Care After °These instructions provide you with information about caring for yourself after your procedure. Your health care provider may also give you more specific instructions. Your treatment has been planned according to current medical practices, but problems sometimes occur. Call your health care provider if   you have any problems or questions after your procedure. °What can I expect after the procedure? °After the procedure, it is common to have: °· Vomiting. °· A sore throat. °· Mental slowness. ° °It is common to feel: °· Nauseous. °· Cold or shivery. °· Sleepy. °· Tired. °· Sore or achy, even in parts of your body where you did not have  surgery. ° °Follow these instructions at home: °For at least 24 hours after the procedure: °· Do not: °? Participate in activities where you could fall or become injured. °? Drive. °? Use heavy machinery. °? Drink alcohol. °? Take sleeping pills or medicines that cause drowsiness. °? Make important decisions or sign legal documents. °? Take care of children on your own. °· Rest. °Eating and drinking °· If you vomit, drink water, juice, or soup when you can drink without vomiting. °· Drink enough fluid to keep your urine clear or pale yellow. °· Make sure you have little or no nausea before eating solid foods. °· Follow the diet recommended by your health care provider. °General instructions °· Have a responsible adult stay with you until you are awake and alert. °· Return to your normal activities as told by your health care provider. Ask your health care provider what activities are safe for you. °· Take over-the-counter and prescription medicines only as told by your health care provider. °· If you smoke, do not smoke without supervision. °· Keep all follow-up visits as told by your health care provider. This is important. °Contact a health care provider if: °· You continue to have nausea or vomiting at home, and medicines are not helpful. °· You cannot drink fluids or start eating again. °· You cannot urinate after 8-12 hours. °· You develop a skin rash. °· You have fever. °· You have increasing redness at the site of your procedure. °Get help right away if: °· You have difficulty breathing. °· You have chest pain. °· You have unexpected bleeding. °· You feel that you are having a life-threatening or urgent problem. °This information is not intended to replace advice given to you by your health care provider. Make sure you discuss any questions you have with your health care provider. °Document Released: 12/10/2000 Document Revised: 02/06/2016 Document Reviewed: 08/18/2015 °Elsevier Interactive Patient Education  © 2018 [employer] ° °

## 2017-09-11 NOTE — Op Note (Signed)
09/11/2017  9:12 AM  PATIENT:  Michael Church  61 y.o. male  PRE-OPERATIVE DIAGNOSIS:  BACK MASS, GLUTEAL MASS  POST-OPERATIVE DIAGNOSIS:  BACK MASS, GLUTEAL MASS  PROCEDURE:  Procedure(s): MINOR EXCISION OF BACK MASS, EXCISION OF GLUTEAL MASS (N/A)  SURGEON:  Surgeon(s) and Role:    * Ralene Ok, MD - Primary  ANESTHESIA:   local and general  EBL:  10 mL   BLOOD ADMINISTERED:none  DRAINS: none   LOCAL MEDICATIONS USED:  BUPIVICAINE   SPECIMEN:  Source of Specimen:  Back mass 3x3cm, GLuteal mass 8x6cm  DISPOSITION OF SPECIMEN:  PATHOLOGY  COUNTS:  YES  TOURNIQUET:  * No tourniquets in log *  DICTATION: .Dragon Dictation   Indications for procedure: Patient is a 61 year old male with a several year history of a right gluteal as well as right back mass. Patient was seen in clinic. Patient states this was increasing in size as well as pain. Patient's that have this electively removed.  Details of procedure: The patient was consented he was taken back to the OR and placed in supine position bilateral SCDs in place. Underwent general endotracheal intubation. He was in place the prone position. Patient was prepped and draped in standard fashion.  A timeout was called and All facts verified.  This time an oblique incision was made over the right gluteal mass. Dissection with cautery was taken down to the mass itself. This was fatty in nature. This was circumferentially dissected away from surrounding adipose tissue. The mass itself was in the subcutaneous tissue. This did not enter the muscle. Once this was circumferentially dissected away this was excised. This measured to be 8 x 6 cm.  The paraspinal incision made over the area of the paraspinal mass. Cautery again was used to maintain hemostasis and dissection was taken of the mass. The skin was also fatty in nature. This was also continued to the subcutaneous tissue. Once this was circumferentially dissected away this  was excised. This measured approximately 3 x 3 cm.  There was irrigated out with sterile saline. Hemostasis was excellent. 2 Vicryl suture then used to reapproximate the deep dermal layer. 2 Monocryl used to reapproximate the epidermal layer in a subcuticular fashion. This was done on both incision sites. 1/4percent Marcaine without was then used to infiltrate both areas. Dermabond was placed on the incision. Patient does procedure well was taken to recovery room stable condition.     PLAN OF CARE: Discharge to home after PACU  PATIENT DISPOSITION:  PACU - hemodynamically stable.   Delay start of Pharmacological VTE agent (>24hrs) due to surgical blood loss or risk of bleeding: not applicable

## 2017-09-12 ENCOUNTER — Encounter (HOSPITAL_COMMUNITY): Payer: Self-pay | Admitting: General Surgery

## 2017-09-13 NOTE — Anesthesia Postprocedure Evaluation (Signed)
Anesthesia Post Note  Patient: Michael Church  Procedure(s) Performed: MINOR EXCISION OF BACK MASS, EXCISION OF GLUTEAL MASS (N/A Back)     Patient location during evaluation: PACU Anesthesia Type: General Level of consciousness: sedated and patient cooperative Pain management: pain level controlled Vital Signs Assessment: post-procedure vital signs reviewed and stable Respiratory status: spontaneous breathing Cardiovascular status: stable Anesthetic complications: no    Last Vitals:  Vitals:   09/11/17 1018 09/11/17 1138  BP: 134/82 (!) 147/86  Pulse: 69 73  Resp: 16 18  Temp: 36.4 C (!) 36.4 C  SpO2: 100% 100%    Last Pain:  Vitals:   09/11/17 1138  TempSrc: Oral  PainSc: 0-No pain   Pain Goal: Patients Stated Pain Goal: 4 (09/11/17 1018)               Nolon Nations

## 2018-01-24 ENCOUNTER — Ambulatory Visit (HOSPITAL_COMMUNITY)
Admission: RE | Admit: 2018-01-24 | Discharge: 2018-01-24 | Disposition: A | Discharge status: Home / Self Care | Payer: No Typology Code available for payment source | Source: Ambulatory Visit | Attending: Urology | Admitting: Urology

## 2018-01-24 ENCOUNTER — Other Ambulatory Visit: Payer: Self-pay | Admitting: Urology

## 2018-01-24 DIAGNOSIS — C642 Malignant neoplasm of left kidney, except renal pelvis: Secondary | ICD-10-CM | POA: Diagnosis not present

## 2018-02-10 ENCOUNTER — Encounter: Payer: Self-pay | Admitting: Family Medicine

## 2018-06-07 ENCOUNTER — Emergency Department (HOSPITAL_COMMUNITY): Payer: No Typology Code available for payment source

## 2018-06-07 ENCOUNTER — Encounter (HOSPITAL_COMMUNITY): Payer: Self-pay | Admitting: Emergency Medicine

## 2018-06-07 ENCOUNTER — Other Ambulatory Visit: Payer: Self-pay

## 2018-06-07 ENCOUNTER — Emergency Department (HOSPITAL_COMMUNITY)
Admission: EM | Admit: 2018-06-07 | Discharge: 2018-06-07 | Disposition: A | Discharge status: Home / Self Care | Payer: No Typology Code available for payment source | Attending: Emergency Medicine | Admitting: Emergency Medicine

## 2018-06-07 DIAGNOSIS — Y999 Unspecified external cause status: Secondary | ICD-10-CM | POA: Insufficient documentation

## 2018-06-07 DIAGNOSIS — I10 Essential (primary) hypertension: Secondary | ICD-10-CM | POA: Diagnosis not present

## 2018-06-07 DIAGNOSIS — Y939 Activity, unspecified: Secondary | ICD-10-CM | POA: Insufficient documentation

## 2018-06-07 DIAGNOSIS — X58XXXA Exposure to other specified factors, initial encounter: Secondary | ICD-10-CM | POA: Diagnosis not present

## 2018-06-07 DIAGNOSIS — Z7984 Long term (current) use of oral hypoglycemic drugs: Secondary | ICD-10-CM | POA: Insufficient documentation

## 2018-06-07 DIAGNOSIS — R109 Unspecified abdominal pain: Secondary | ICD-10-CM | POA: Diagnosis present

## 2018-06-07 DIAGNOSIS — S39012A Strain of muscle, fascia and tendon of lower back, initial encounter: Secondary | ICD-10-CM | POA: Insufficient documentation

## 2018-06-07 DIAGNOSIS — Z79899 Other long term (current) drug therapy: Secondary | ICD-10-CM | POA: Insufficient documentation

## 2018-06-07 DIAGNOSIS — E119 Type 2 diabetes mellitus without complications: Secondary | ICD-10-CM | POA: Diagnosis not present

## 2018-06-07 DIAGNOSIS — Z7982 Long term (current) use of aspirin: Secondary | ICD-10-CM | POA: Insufficient documentation

## 2018-06-07 DIAGNOSIS — Y929 Unspecified place or not applicable: Secondary | ICD-10-CM | POA: Diagnosis not present

## 2018-06-07 LAB — BASIC METABOLIC PANEL
Anion gap: 11 (ref 5–15)
BUN: 16 mg/dL (ref 6–20)
CALCIUM: 9.4 mg/dL (ref 8.9–10.3)
CHLORIDE: 102 mmol/L (ref 98–111)
CO2: 25 mmol/L (ref 22–32)
CREATININE: 1.07 mg/dL (ref 0.61–1.24)
GFR calc Af Amer: >60 mL/min (ref >60)
GFR calc non Af Amer: >60 mL/min (ref >60)
GLUCOSE: 125 mg/dL — AB (ref 70–99)
Potassium: 3.9 mmol/L (ref 3.5–5.1)
Sodium: 138 mmol/L (ref 135–145)

## 2018-06-07 LAB — URINALYSIS, ROUTINE W REFLEX MICROSCOPIC
BILIRUBIN URINE: NEGATIVE
Bacteria, UA: NONE SEEN
GLUCOSE, UA: NEGATIVE mg/dL
KETONES UR: NEGATIVE mg/dL
Leukocytes, UA: NEGATIVE
NITRITE: NEGATIVE
PH: 6 (ref 5.0–8.0)
Protein, ur: NEGATIVE mg/dL
Specific Gravity, Urine: 1.012 (ref 1.005–1.030)

## 2018-06-07 LAB — CBC
HCT: 47.7 % (ref 39.0–52.0)
Hemoglobin: 15.6 g/dL (ref 13.0–17.0)
MCH: 29.1 pg (ref 26.0–34.0)
MCHC: 32.7 g/dL (ref 30.0–36.0)
MCV: 88.8 fL (ref 78.0–100.0)
PLATELETS: 248 10*3/uL (ref 150–400)
RBC: 5.37 MIL/uL (ref 4.22–5.81)
RDW: 12.3 % (ref 11.5–15.5)
WBC: 4.9 10*3/uL (ref 4.0–10.5)

## 2018-06-07 MED ORDER — HYDROMORPHONE HCL 1 MG/ML IJ SOLN
1.0000 mg | Freq: Once | INTRAMUSCULAR | Status: AC
  Administered 2018-06-07: 1 mg via INTRAVENOUS
  Filled 2018-06-07: qty 1

## 2018-06-07 MED ORDER — ONDANSETRON HCL 4 MG/2ML IJ SOLN
4.0000 mg | Freq: Once | INTRAMUSCULAR | Status: AC
  Administered 2018-06-07: 4 mg via INTRAVENOUS
  Filled 2018-06-07: qty 2

## 2018-06-07 MED ORDER — CYCLOBENZAPRINE HCL 10 MG PO TABS: 10.0000 mg | ORAL_TABLET | Freq: Two times a day (BID) | ORAL | 0 refills | Status: DC | PRN

## 2018-06-07 NOTE — ED Notes (Signed)
Got patient undress on the monitor did ekg shown to er doctor patient is resting with call bell in reach 

## 2018-06-07 NOTE — Discharge Instructions (Addendum)
Your CT scan was reassuring, no kidney stones.  Your blood work was also reassuring, normal kidney function.  Urine looked good.  Your pain is likely muscular back pain.  I written you prescription for a muscle relaxer which you can use as needed.  Remember that this medicine can make you drowsy so please do not drive, work or drink alcohol while taking it.  You can also take ibuprofen every 6 hours to help with your pain.  Apply heat to the lower back.  No heavy lifting or pushing activities.   Return to the ER if you have any new or concerning symptoms like numbness in your feet or legs, loss of bowel or bladder control, fever.

## 2018-06-07 NOTE — ED Triage Notes (Signed)
Pt to ED via [facility]> C/o R flank pain since Friday morning.  Took ibuprofen and increased water intake during the day Friday.  Pain woke him up this morning.  Hx of partial R nephrectomy.  Pt moved heavy furniture on Tuesday.

## 2018-06-07 NOTE — ED Notes (Addendum)
Family updated on wait for treatment room. Contacted lab regarding CBC and BMP results.

## 2018-06-07 NOTE — ED Provider Notes (Signed)
[facility] EMERGENCY DEPARTMENT Provider Note   CSN: [id number] Arrival date & time: 06/07/18  0224     History   Chief Complaint Chief Complaint  Patient presents with  . Flank Pain    HPI Michael Church is a 62 y.o. male.  HPI   Michael Church is a 62yo male with a history of type 2 diabetes, hypertension, renal mass (s/p partial nephrectomy 2017) who presents to the emergency department for evaluation of right-sided lower back pain, nausea.  Patient reports that he developed back pain yesterday morning.  Thought he may have pulled a muscle because he recently helped his brother move out of his house.  He drank water, took ibuprofen and applied heat to the lower back without improvement.  He reports that the pain in his back feels "like a crushing pain" and is worsened with standing up, bending or twisting at the waist.  It occasionally radiates to the right groin area.  Pain was initially intermittent, but became constant overnight which prompted his ER visit.  He has felt nauseated with the pain, no vomiting.  This morning the pain was so bad that he felt unsteady on his feet which prompted his wife to call EMS.  Other than his partial nephrectomy he denies prior abdominal surgeries.  No fever, chills, hematuria, urinary frequency, dysuria, testicular pain, scrotal swelling, numbness, weakness, abdominal pain, shortness of breath, cough, chest pain, syncope. No loss of bowel or bladder control. He is able to ambulate independently, although painful.   Past Medical History:  Diagnosis Date  . Complication of anesthesia    "didn't numb well with local anesthesia with cervical spine surgery"  . Diabetes mellitus without complication ([place])    oral meds use ; TYPE 2   . Hepatitis    per patient report, his PCP states a small portion of his liver may have been damaged during partial nephrectomy  but liver levels are ok and no implications to his health   . Herniated  lumbar intervertebral disc    not a problem presently  . Hypertension   . Prehypertension   . Renal mass, right    surgery planned 09-07-16    Patient Active Problem List   Diagnosis Date Noted  . Portal vein thrombosis 04/04/2017  . History of kidney cancer 10/15/2016  . BMI 34.0-34.9,adult 08/25/2016  . Type 2 diabetes mellitus without complications ([place]) 13/04/6577  . Essential hypertension 09/22/2012  . Varicose vein of leg 09/22/2012    Past Surgical History:  Procedure Laterality Date  . CERVICAL SPINE SURGERY  10/2011  . left knee torn meniscus  03/2011  . MASS EXCISION N/A 09/11/2017   Procedure: MINOR EXCISION OF BACK MASS, EXCISION OF GLUTEAL MASS;  Surgeon: Ralene Ok, MD;  Location: [facility];  Service: General;  Laterality: N/A;  . PARTIAL NEPHRECTOMY Right 09/07/2016  . ROBOTIC ASSITED PARTIAL NEPHRECTOMY Right 09/07/2016   Procedure: XI ROBOTIC ASSITED LAPAROSCOPIC RIGHT PARTIAL NEPHRECTOMY;  Surgeon: Ardis Hughs, MD;  Location: [facility];  Service: Urology;  Laterality: Right;  . TONSILLECTOMY    . VARICOSE VEIN SURGERY     bilateral        Home Medications    Prior to Admission medications   Medication Sig Start Date End Date Taking? Authorizing Provider  APPLE CIDER VINEGAR PO Take by mouth. 5 cc in the evening with water   Yes [provider]  aspirin EC 81 MG tablet Take 81 mg by mouth  daily.   Yes [provider]  hydrochlorothiazide (MICROZIDE) 12.5 MG capsule TAKE 1 CAPSULE BY MOUTH DAILY Patient taking differently: Take 12.5 mg by mouth daily.  09/25/16  Yes Jeffery, Chelle, PA  ibuprofen (ADVIL,MOTRIN) 200 MG tablet Take 600 mg by mouth 2 (two) times daily as needed for headache or moderate pain.   Yes [provider]  metFORMIN (GLUCOPHAGE) 500 MG tablet TAKE 1 TABLET(500 MG) BY MOUTH TWICE DAILY WITH A MEAL Patient taking differently: Take 500 mg by mouth 2 (two) times daily with a meal. TAKE 1 TABLET(500 MG) BY  MOUTH TWICE DAILY WITH A MEAL 08/31/16  Yes Jeffery, Domingo Mend, PA  Multiple Vitamin (MULTIVITAMIN WITH MINERALS) TABS Take 1 tablet by mouth daily.   Yes [provider]  OVER THE COUNTER MEDICATION Take 2 capsules by mouth 2 (two) times daily. Primaljax   Yes [provider]  Polyethyl Glycol-Propyl Glycol (SYSTANE) 0.4-0.3 % SOLN Place 2 drops into both eyes as needed.   Yes [provider]  saw palmetto 500 MG capsule Take 500 mg by mouth 2 (two) times daily.   Yes [provider]  oxyCODONE-acetaminophen (ROXICET) 5-325 MG tablet Take 1 tablet by mouth every 6 (six) hours as needed. Patient not taking: Reported on 06/07/2018 09/11/17 09/11/18  Ralene Ok, MD    Family History Family History  Problem Relation Age of Onset  . Stroke Father        age 7  . Cancer Brother        Hodgkin    Social History Social History   Tobacco Use  . Smoking status: Never Smoker  . Smokeless tobacco: Never Used  . Tobacco comment: very rare use social  Substance Use Topics  . Alcohol use: Yes    Alcohol/week: 1.0 standard drinks    Types: 1 Standard drinks or equivalent per week    Comment: OCCASIONALLY DRINK - BEER AND MIXED DRINKS  . Drug use: No     Allergies   Other and Penicillins   Review of Systems Review of Systems  Constitutional: Negative for chills and fever.  Eyes: Negative for visual disturbance.  Respiratory: Negative for shortness of breath.   Cardiovascular: Negative for chest pain.  Gastrointestinal: Positive for nausea. Negative for abdominal pain and vomiting.  Genitourinary: Negative for difficulty urinating, dysuria, flank pain, hematuria, scrotal swelling and testicular pain.  Musculoskeletal: Positive for back pain (right lower). Negative for gait problem.  Skin: Negative for rash.  Neurological: Negative for syncope, weakness and numbness.  Psychiatric/Behavioral: Negative for agitation.     Physical Exam Updated  Vital Signs BP 103/71 (BP Location: Right Arm)   Pulse (!) 58   Temp (!) 97.5 F (36.4 C) (Oral)   Resp 18   SpO2 93%   Physical Exam  Constitutional: He is oriented to person, place, and time. He appears well-developed and well-nourished. No distress.  Nontoxic-appearing.  HENT:  Head: Normocephalic and atraumatic.  Eyes: Right eye exhibits no discharge. Left eye exhibits no discharge.  Cardiovascular: Normal rate, regular rhythm and intact distal pulses.  Pulmonary/Chest: Effort normal and breath sounds normal. No stridor. No respiratory distress. He has no wheezes. He has no rales.  Abdominal:  Abdomen soft and nondistended.  Several well-healed surgical scars.  Bowel sounds normoactive.  Mildly tender to palpation in the right flank area.  No rebound, guarding or rigidity.  Right-sided CVA tenderness present.  Musculoskeletal:  No midline T-spine or L-spine tenderness.  Tender to palpation over  right paraspinal muscles of the thoracic and lumbar spine.  No overlying rash in the skin.  Strength 5/5 in bilateral knee flexion/extension and ankle dorsiflexion/plantarflexion.  DP pulses 2+ and symmetric bilaterally.  Neurological: He is alert and oriented to person, place, and time. Coordination normal.  Sensation to light touch intact in bilateral lower extremities.  Skin: Skin is warm and dry. He is not diaphoretic.  Psychiatric: He has a normal mood and affect. His behavior is normal.  Nursing note and vitals reviewed.    ED Treatments / Results  Labs (all labs ordered are listed, but only abnormal results are displayed) Labs Reviewed  URINALYSIS, ROUTINE W REFLEX MICROSCOPIC - Abnormal; Notable for the following components:      Result Value   Hgb urine dipstick MODERATE (*)    All other components within normal limits  BASIC METABOLIC PANEL - Abnormal; Notable for the following components:   Glucose, Bld 125 (*)    All other components within normal limits  CBC     EKG None  Radiology Ct Renal Stone Study  Result Date: 06/07/2018 CLINICAL DATA:  Low back pain radiating into the right lower abdomen since last night. EXAM: CT ABDOMEN AND PELVIS WITHOUT CONTRAST TECHNIQUE: Multidetector CT imaging of the abdomen and pelvis was performed following the standard protocol without IV contrast. COMPARISON:  CT 01/24/2018. FINDINGS: Lower chest: New prominent subsegmental atelectasis at both lung bases. There is no confluent airspace opacity, suspicious pulmonary nodule or significant pleural effusion. Hepatobiliary: Borderline hepatic steatosis without focal abnormality on noncontrast imaging. There is stable atrophy in the left hepatic lobe. No evidence of gallstones, gallbladder wall thickening or biliary dilatation. Pancreas: Unremarkable. No pancreatic ductal dilatation or surrounding inflammatory changes. Spleen: Normal in size without focal abnormality. Adrenals/Urinary Tract: The right adrenal gland appears normal. There is a stable left adrenal adenoma measuring 17 mm and 6 HU on image 31/3. There are postsurgical changes in the upper pole of the right kidney from reported previous partial nephrectomy for renal cell carcinoma. Both kidneys otherwise appear unremarkable. No evidence of urinary tract calculus or hydronephrosis. The bladder is nearly empty without demonstrated abnormality. Stomach/Bowel: No evidence of bowel wall thickening, distention or surrounding inflammatory change. The appendix is not convincingly demonstrated, although there is no pericecal inflammation. Mild sigmoid colon diverticular changes are present. Vascular/Lymphatic: There are no enlarged abdominal or pelvic lymph nodes. Mild aortoiliac atherosclerosis. Reproductive: Stable moderate enlargement of the prostate gland. Other: Ventral epigastric hernia containing only fat has mildly enlarged, measuring up to 3.2 cm transverse on image 41/3. No ascites or peritoneal nodularity.  Musculoskeletal: No acute or significant osseous findings. There is facet arthropathy in the lower lumbar spine. IMPRESSION: 1. No acute findings or explanation for the patient's symptoms. No evidence of urinary tract calculus or hydronephrosis. 2. Grossly stable postsurgical changes status post partial right nephrectomy. No evidence of metastatic disease. 3. Ventral abdominal wall hernia containing only fat. 4. Subsegmental atelectasis at both lung bases. Electronically Signed   By: Richardean Sale M.D.   On: 06/07/2018 11:16    Procedures Procedures (including critical care time)  Medications Ordered in ED Medications  ondansetron (ZOFRAN) injection 4 mg (4 mg Intravenous Given 06/07/18 1107)  HYDROmorphone (DILAUDID) injection 1 mg (1 mg Intravenous Given 06/07/18 1107)     Initial Impression / Assessment and Plan / ED Course  I have reviewed the triage vital signs and the nursing notes.  Pertinent labs & imaging results that were available during my  care of the patient were reviewed by me and considered in my medical decision making (see chart for details).    Symptoms consistent with lumbar strain given worsened with movement, palpation and recent heavy lifting activity.  I did get a CT renal study given his nausea and report that his pain radiated to his groin. CT negative for acute stone or intra-abdominal abnormality.  Patient reports improvement of pain in the ED, is tolerating p.o. fluids at the bedside.  Will discharge home with anti-inflammatories, muscle relaxer and RICE protocol.  His neurological exam is normal, he is able to ambulate independently and no loss of bowel or bladder control.  I have no concern for cauda equina. No fever, leukocytosis, hx of IVDU or spinal injection and I do not suspect osteomylitis or diskitis. Patient counseled on return precautions and he agrees and appears reliable.   Final Clinical Impressions(s) / ED Diagnoses   Final diagnoses:  Strain of  lumbar region, initial encounter    ED Discharge Orders         Ordered    cyclobenzaprine (FLEXERIL) 10 MG tablet  2 times daily PRN     06/07/18 1404           Glyn Ade, PA-C 06/08/18 [id number]    Blanchie Dessert, MD 06/08/18 [phone]

## 2018-06-07 NOTE — ED Notes (Signed)
Patient is resting with family at bedside and call bell in reach got patient on the monitor into a gown

## 2018-06-07 NOTE — ED Notes (Signed)
Patient verbalizes understanding of discharge instructions. Opportunity for questioning and answers were provided. Ambulatory at discharge in NAD.  

## 2018-06-19 ENCOUNTER — Encounter: Payer: Self-pay | Admitting: Gastroenterology

## 2018-10-30 ENCOUNTER — Encounter: Payer: Self-pay | Admitting: Family Medicine

## 2018-11-21 ENCOUNTER — Encounter: Payer: Self-pay | Admitting: Gastroenterology

## 2018-11-21 ENCOUNTER — Ambulatory Visit (AMBULATORY_SURGERY_CENTER): Payer: Self-pay

## 2018-11-21 VITALS — Ht 72.0 in | Wt 256.4 lb

## 2018-11-21 DIAGNOSIS — Z1211 Encounter for screening for malignant neoplasm of colon: Secondary | ICD-10-CM

## 2018-11-21 MED ORDER — NA SULFATE-K SULFATE-MG SULF 17.5-3.13-1.6 GM/177ML PO SOLN: 1.0000 | Freq: Once | ORAL | 0 refills | Status: AC

## 2018-11-21 NOTE — Progress Notes (Signed)
Per pt, no allergies to soy or egg products.Pt not taking any weight loss meds or using  O2 at home.  Pt refused emmi video. 

## 2018-11-28 ENCOUNTER — Other Ambulatory Visit: Payer: Self-pay

## 2018-11-28 ENCOUNTER — Ambulatory Visit (AMBULATORY_SURGERY_CENTER): Payer: No Typology Code available for payment source | Admitting: Gastroenterology

## 2018-11-28 ENCOUNTER — Encounter: Payer: Self-pay | Admitting: Gastroenterology

## 2018-11-28 VITALS — BP 118/69 | HR 65 | Temp 97.7°F | Resp 14 | Ht 72.0 in | Wt 256.0 lb

## 2018-11-28 DIAGNOSIS — D125 Benign neoplasm of sigmoid colon: Secondary | ICD-10-CM | POA: Diagnosis not present

## 2018-11-28 DIAGNOSIS — D123 Benign neoplasm of transverse colon: Secondary | ICD-10-CM | POA: Diagnosis not present

## 2018-11-28 DIAGNOSIS — Z1211 Encounter for screening for malignant neoplasm of colon: Secondary | ICD-10-CM

## 2018-11-28 DIAGNOSIS — K635 Polyp of colon: Secondary | ICD-10-CM

## 2018-11-28 MED ORDER — SODIUM CHLORIDE 0.9 % IV SOLN: 500.0000 mL | INTRAVENOUS | Status: DC

## 2018-11-28 NOTE — Progress Notes (Signed)
Pt's states no medical or surgical changes since previsit or office visit. 

## 2018-11-28 NOTE — Patient Instructions (Signed)
Handouts given for polyps, diverticulosis and hemorrhoids.  Await pathology results.  YOU HAD AN ENDOSCOPIC PROCEDURE TODAY AT [facility]:   Refer to the procedure report that was given to you for any specific questions about what was found during the examination.  If the procedure report does not answer your questions, please call your gastroenterologist to clarify.  If you requested that your care partner not be given the details of your procedure findings, then the procedure report has been included in a sealed envelope for you to review at your convenience later.  YOU SHOULD EXPECT: Some feelings of bloating in the abdomen. Passage of more gas than usual.  Walking can help get rid of the air that was put into your GI tract during the procedure and reduce the bloating. If you had a lower endoscopy (such as a colonoscopy or flexible sigmoidoscopy) you may notice spotting of blood in your stool or on the toilet paper. If you underwent a bowel prep for your procedure, you may not have a normal bowel movement for a few days.  Please Note:  You might notice some irritation and congestion in your nose or some drainage.  This is from the oxygen used during your procedure.  There is no need for concern and it should clear up in a day or so.  SYMPTOMS TO REPORT IMMEDIATELY:   Following lower endoscopy (colonoscopy or flexible sigmoidoscopy):  Excessive amounts of blood in the stool  Significant tenderness or worsening of abdominal pains  Swelling of the abdomen that is new, acute  Fever of 100F or higher  For urgent or emergent issues, a gastroenterologist can be reached at any hour by calling [phone].   DIET:  We do recommend a small meal at first, but then you may proceed to your regular diet.  Drink plenty of fluids but you should avoid alcoholic beverages for 24 hours.  ACTIVITY:  You should plan to take it easy for the rest of today and you should NOT DRIVE or use  heavy machinery until tomorrow (because of the sedation medicines used during the test).    FOLLOW UP: Our staff will call the number listed on your records the next business day following your procedure to check on you and address any questions or concerns that you may have regarding the information given to you following your procedure. If we do not reach you, we will leave a message.  However, if you are feeling well and you are not experiencing any problems, there is no need to return our call.  We will assume that you have returned to your regular daily activities without incident.  If any biopsies were taken you will be contacted by phone or by letter within the next 1-3 weeks.  Please call us at [phone] if you have not heard about the biopsies in 3 weeks.    SIGNATURES/CONFIDENTIALITY: You and/or your care partner have signed paperwork which will be entered into your electronic medical record.  These signatures attest to the fact that that the information above on your After Visit Summary has been reviewed and is understood.  Full responsibility of the confidentiality of this discharge information lies with you and/or your care-partner.

## 2018-11-28 NOTE — Progress Notes (Signed)
To PACU, VSS. Report to [email or web address] 

## 2018-11-28 NOTE — Progress Notes (Signed)
Called to room to assist during endoscopic procedure.  Patient ID and intended procedure confirmed with present staff. Received instructions for my participation in the procedure from the performing physician.  

## 2018-11-28 NOTE — Op Note (Signed)
[facility] Patient Name: Michael Church Procedure Date: 11/28/2018 1:53 PM MRN: [id number] Endoscopist: Remo Lipps P. Havery Moros , MD Age: 63 Referring MD:  Date of Birth: 1956-04-06 Gender: Male Account #: [id number] Procedure:                Colonoscopy Indications:              Screening for colorectal malignant neoplasm Medicines:                Monitored Anesthesia Care Procedure:                Pre-Anesthesia Assessment:                           - Prior to the procedure, a History and Physical                            was performed, and patient medications and                            allergies were reviewed. The patient's tolerance of                            previous anesthesia was also reviewed. The risks                            and benefits of the procedure and the sedation                            options and risks were discussed with the patient.                            All questions were answered, and informed consent                            was obtained. Prior Anticoagulants: The patient has                            taken no previous anticoagulant or antiplatelet                            agents. ASA Grade Assessment: II - A patient with                            mild systemic disease. After reviewing the risks                            and benefits, the patient was deemed in                            satisfactory condition to undergo the procedure.                           After obtaining informed consent, the colonoscope  was passed under direct vision. Throughout the                            procedure, the patient's blood pressure, pulse, and                            oxygen saturations were monitored continuously. The                            Colonoscope was introduced through the anus and                            advanced to the the cecum, identified by                            appendiceal orifice and  ileocecal valve. The                            colonoscopy was performed without difficulty. The                            patient tolerated the procedure well. The quality                            of the bowel preparation was good. The ileocecal                            valve, appendiceal orifice, and rectum were                            photographed. Scope In: 2:19:48 PM Scope Out: 2:38:50 PM Scope Withdrawal Time: 0 hours 17 minutes 19 seconds  Total Procedure Duration: 0 hours 19 minutes 2 seconds  Findings:                 The perianal and digital rectal examinations were                            normal.                           A few medium-mouthed diverticula were found in the                            left colon.                           Three sessile polyps were found in the transverse                            colon. The polyps were 3 to 4 mm in size. These                            polyps were removed with a cold snare. Resection  and retrieval were complete.                           A 3 mm polyp was found in the sigmoid colon. The                            polyp was sessile. The polyp was removed with a                            cold snare. Resection and retrieval were complete.                           Internal hemorrhoids were found during retroflexion.                           The exam was otherwise without abnormality. Complications:            No immediate complications. Estimated blood loss:                            Minimal. Estimated Blood Loss:     Estimated blood loss was minimal. Impression:               - Diverticulosis in the left colon.                           - Three 3 to 4 mm polyps in the transverse colon,                            removed with a cold snare. Resected and retrieved.                           - One 3 mm polyp in the sigmoid colon, removed with                            a cold snare. Resected  and retrieved.                           - Internal hemorrhoids.                           - The examination was otherwise normal. Recommendation:           - Patient has a contact number available for                            emergencies. The signs and symptoms of potential                            delayed complications were discussed with the                            patient. Return to normal activities tomorrow.  Written discharge instructions were provided to the                            patient.                           - Resume previous diet.                           - Continue present medications.                           - Await pathology results. Remo Lipps P. Armbruster, MD 11/28/2018 2:43:37 PM This report has been signed electronically.

## 2018-12-01 ENCOUNTER — Telehealth: Payer: Self-pay

## 2018-12-01 NOTE — Telephone Encounter (Signed)
  Follow up Call-  Call back number 11/28/2018  Post procedure Call Back phone  # [phone]  Permission to leave phone message Yes  Some recent data might be hidden     Patient questions:  Do you have a fever, pain , or abdominal swelling? No. Pain Score  0 *  Have you tolerated food without any problems? Yes.    Have you been able to return to your normal activities? Yes.    Do you have any questions about your discharge instructions: Diet   No. Medications  No. Follow up visit  No.  Do you have questions or concerns about your Care? No.  Actions: * If pain score is 4 or above: No action needed, pain <4.

## 2018-12-09 ENCOUNTER — Encounter: Payer: Self-pay | Admitting: Gastroenterology

## 2018-12-18 ENCOUNTER — Encounter: Payer: Self-pay | Admitting: Adult Health

## 2018-12-18 ENCOUNTER — Other Ambulatory Visit: Payer: Self-pay

## 2018-12-18 ENCOUNTER — Ambulatory Visit (INDEPENDENT_AMBULATORY_CARE_PROVIDER_SITE_OTHER): Payer: No Typology Code available for payment source | Admitting: Adult Health

## 2018-12-18 DIAGNOSIS — I10 Essential (primary) hypertension: Secondary | ICD-10-CM | POA: Diagnosis not present

## 2018-12-18 DIAGNOSIS — E119 Type 2 diabetes mellitus without complications: Secondary | ICD-10-CM | POA: Diagnosis not present

## 2018-12-18 DIAGNOSIS — Z85528 Personal history of other malignant neoplasm of kidney: Secondary | ICD-10-CM | POA: Diagnosis not present

## 2018-12-18 DIAGNOSIS — Z7689 Persons encountering health services in other specified circumstances: Secondary | ICD-10-CM

## 2018-12-18 MED ORDER — METFORMIN HCL 500 MG PO TABS: 500.0000 mg | ORAL_TABLET | Freq: Two times a day (BID) | ORAL | 0 refills | Status: DC

## 2018-12-18 NOTE — Progress Notes (Signed)
Virtual Visit via Video Note  I connected with Michael Church on 12/18/18 at  9:00 AM EDT by a video enabled telemedicine application and verified that I am speaking with the correct person using two identifiers.  Location patient: home Location provider:work or home office Persons participating in the virtual visit: patient, provider  I discussed the limitations of evaluation and management by telemedicine and the availability of in person appointments. The patient expressed understanding and agreed to proceed.   Establish Care visit. Pleasant 63 year old male who  has a past medical history of Complication of anesthesia, Diabetes mellitus without complication ([place]), Hepatitis, Herniated lumbar intervertebral disc, Hypertension, and Renal mass, right.  Former patient of Dr. Lynelle Doctor at [place]   His last CPE was over a year ago.   Acute Concerns: Establish Care  Chronic Issues: DM - takes metformin 500 mg BID. His last A1c at [facility] was 6.3 in February. He does monitor his BS at home periodically and reports readings in the 90's.  He denies any symptoms of hypoglycemia Lab Results  Component Value Date   HGBA1C 6.0 (H) 09/05/2017   Essential Hypertension - Takes HCTZ 12.5 mg.  BP Readings from Last 3 Encounters:  11/28/18 118/69  06/07/18 130/78  09/11/17 (!) 147/86   H/o right  renal mass -surgical removal in 2017?Marland Kitchen  Has follow-up with oncology February 17, 2019 at which time he is hopeful that they will release him from their care.  Denies any hematuria   Health Maintenance: Dental -- Routine Care  Vision -- Routine Care - Dr. Schuyler Amor  Immunizations -- UTD Colonoscopy -- UTD - last was 11/2018- every 5 years d/t polyps    Past Medical History:  Diagnosis Date  . Complication of anesthesia    "didn't numb well with local anesthesia with cervical spine surgery"  . Diabetes mellitus without complication ([place])    oral meds use ; TYPE 2   . Hepatitis    per  patient report, his PCP states a small portion of his liver may have been damaged during partial nephrectomy  but liver levels are ok and no implications to his health   . Herniated lumbar intervertebral disc    not a problem presently  . Hypertension   . Prehypertension   . Renal mass, right    surgery planned 09-07-16    Past Surgical History:  Procedure Laterality Date  . CERVICAL SPINE SURGERY  10/2011  . KNEE SURGERY  2017   right knee/ torn meniscus in back  . left knee torn meniscus Left 03/2011  . MASS EXCISION N/A 09/11/2017   Procedure: MINOR EXCISION OF BACK MASS, EXCISION OF GLUTEAL MASS;  Surgeon: Ralene Ok, MD;  Location: [facility];  Service: General;  Laterality: N/A;  . PARTIAL NEPHRECTOMY Right 09/07/2016  . ROBOTIC ASSITED PARTIAL NEPHRECTOMY Right 09/07/2016   Procedure: XI ROBOTIC ASSITED LAPAROSCOPIC RIGHT PARTIAL NEPHRECTOMY;  Surgeon: Ardis Hughs, MD;  Location: [facility];  Service: Urology;  Laterality: Right;  . TONSILLECTOMY     as a child  . VARICOSE VEIN SURGERY     bilateral    Current Outpatient Medications on File Prior to Visit  Medication Sig Dispense Refill  . aspirin EC 81 MG tablet Take 81 mg by mouth daily.    . hydrochlorothiazide (MICROZIDE) 12.5 MG capsule TAKE 1 CAPSULE BY MOUTH DAILY (Patient taking differently: Take 12.5 mg by mouth daily. ) 30 capsule 1  . metFORMIN (GLUCOPHAGE) 500  MG tablet TAKE 1 TABLET(500 MG) BY MOUTH TWICE DAILY WITH A MEAL (Patient taking differently: Take 500 mg by mouth 2 (two) times daily with a meal. TAKE 1 TABLET(500 MG) BY MOUTH TWICE DAILY WITH A MEAL) 180 tablet 3  . Multiple Vitamin (MULTIVITAMIN WITH MINERALS) TABS Take 1 tablet by mouth daily.    . saw palmetto 500 MG capsule Take 500 mg by mouth 2 (two) times daily.    . TURMERIC PO Take by mouth.     Current Facility-Administered Medications on File Prior to Visit  Medication Dose Route Frequency Provider Last Rate Last Dose  . 0.9 %  sodium  chloride infusion  500 mL Intravenous Continuous Armbruster, Carlota Raspberry, MD        Allergies  Allergen Reactions  . Other Itching    Foods - cantaloupes, tomatoes, bananas, and  raw carrots.  . Penicillins Itching    Has patient had a PCN reaction causing immediate rash, facial/tongue/throat swelling, SOB or lightheadedness with hypotension: No Has patient had a PCN reaction causing severe rash involving mucus membranes or skin necrosis: No Has patient had a PCN reaction that required hospitalization: No Has patient had a PCN reaction occurring within the last 10 years: No If all of the above answers are "NO", then may proceed with Cephalosporin use.     Family History  Problem Relation Age of Onset  . Stroke Father        age 65  . Cancer Brother        Hodgkin  . Colon cancer Neg Hx   . Esophageal cancer Neg Hx   . Rectal cancer Neg Hx   . Stomach cancer Neg Hx     Social History   Socioeconomic History  . Marital status: Married    Spouse name: Seth Bake  . Number of children: 2  . Years of education: Not on file  . Highest education level: Not on file  Occupational History  . Occupation: Code and compliance    Employer: [employer]  . Financial resource strain: Not on file  . Food insecurity:    Worry: Not on file    Inability: Not on file  . Transportation needs:    Medical: Not on file    Non-medical: Not on file  Tobacco Use  . Smoking status: Never Smoker  . Smokeless tobacco: Never Used  . Tobacco comment: very rare use social  Substance and Sexual Activity  . Alcohol use: Yes    Comment: rare  . Drug use: No  . Sexual activity: Yes  Lifestyle  . Physical activity:    Days per week: Not on file    Minutes per session: Not on file  . Stress: Not on file  Relationships  . Social connections:    Talks on phone: Not on file    Gets together: Not on file    Attends religious service: Not on file    Active member of club or  organization: Not on file    Attends meetings of clubs or organizations: Not on file    Relationship status: Not on file  . Intimate partner violence:    Fear of current or ex partner: Not on file    Emotionally abused: Not on file    Physically abused: Not on file    Forced sexual activity: Not on file  Other Topics Concern  . Not on file  Social History Narrative  . Not on file  Review of Systems  Constitutional: Negative.   Respiratory: Negative.   Cardiovascular: Negative.   Gastrointestinal: Negative.   Genitourinary: Negative.   Musculoskeletal: Positive for joint pain.  Neurological: Negative.   Psychiatric/Behavioral: Negative.   All other systems reviewed and are negative.   There were no vitals taken for this visit.  Physical Exam  VITALS per patient if applicable:  GENERAL: alert, oriented, appears well and in no acute distress  HEENT: atraumatic, conjunttiva clear, no obvious abnormalities on inspection of external nose and ears  NECK: normal movements of the head and neck  LUNGS: on inspection no signs of respiratory distress, breathing rate appears normal, no obvious gross SOB, gasping or wheezing  CV: no obvious cyanosis  MS: moves all visible extremities without noticeable abnormality  PSYCH/NEURO: pleasant and cooperative, no obvious depression or anxiety, speech and thought processing grossly intact     Assessment/Plan: 1. Encounter to establish care -Have him follow-up for CPE as soon as COVID-19 pandemic has dissipated.  Would like him to work on diet and exercise in the time being.  He was advised to follow-up sooner if he has any acute complaints  2. Type 2 diabetes mellitus without complication, without long-term current use of insulin (HCC) -Appears to be very well controlled with metformin.  Encouraged lifestyle modifications - metFORMIN (GLUCOPHAGE) 500 MG tablet; Take 1 tablet (500 mg total) by mouth 2 (two) times daily with a meal.  TAKE 1 TABLET(500 MG) BY MOUTH TWICE DAILY WITH A MEAL  Dispense: 180 tablet; Refill: 0  3. History of kidney cancer -Follow-up with oncology as directed  4. Essential hypertension -Is to be very well controlled with HCTZ.  No change in medications at this time.  Encouraged lifestyle modification     I discussed the assessment and treatment plan with the patient. The patient was provided an opportunity to ask questions and all were answered. The patient agreed with the plan and demonstrated an understanding of the instructions.   The patient was advised to call back or seek an in-person evaluation if the symptoms worsen or if the condition fails to improve as anticipated.   Dorothyann Peng, NP

## 2019-02-26 ENCOUNTER — Other Ambulatory Visit: Payer: Self-pay

## 2019-02-26 ENCOUNTER — Other Ambulatory Visit (HOSPITAL_COMMUNITY): Payer: Self-pay | Admitting: Urology

## 2019-02-26 ENCOUNTER — Ambulatory Visit (HOSPITAL_COMMUNITY)
Admission: RE | Admit: 2019-02-26 | Discharge: 2019-02-26 | Disposition: A | Discharge status: Home / Self Care | Payer: No Typology Code available for payment source | Source: Ambulatory Visit | Attending: Urology | Admitting: Urology

## 2019-02-26 DIAGNOSIS — C642 Malignant neoplasm of left kidney, except renal pelvis: Secondary | ICD-10-CM | POA: Insufficient documentation

## 2019-05-15 ENCOUNTER — Other Ambulatory Visit: Payer: Self-pay | Admitting: Adult Health

## 2019-05-15 DIAGNOSIS — E119 Type 2 diabetes mellitus without complications: Secondary | ICD-10-CM

## 2019-05-17 ENCOUNTER — Other Ambulatory Visit: Payer: Self-pay | Admitting: Adult Health

## 2019-06-12 ENCOUNTER — Ambulatory Visit: Payer: No Typology Code available for payment source | Admitting: Adult Health

## 2019-06-12 ENCOUNTER — Encounter: Payer: Self-pay | Admitting: Adult Health

## 2019-06-12 ENCOUNTER — Other Ambulatory Visit: Payer: Self-pay

## 2019-06-12 VITALS — BP 130/92 | HR 71 | Temp 98.6°F | Wt 253.8 lb

## 2019-06-12 DIAGNOSIS — E119 Type 2 diabetes mellitus without complications: Secondary | ICD-10-CM | POA: Diagnosis not present

## 2019-06-12 DIAGNOSIS — Z0000 Encounter for general adult medical examination without abnormal findings: Secondary | ICD-10-CM | POA: Diagnosis not present

## 2019-06-12 DIAGNOSIS — Z125 Encounter for screening for malignant neoplasm of prostate: Secondary | ICD-10-CM | POA: Diagnosis not present

## 2019-06-12 DIAGNOSIS — Z23 Encounter for immunization: Secondary | ICD-10-CM | POA: Diagnosis not present

## 2019-06-12 DIAGNOSIS — Z85528 Personal history of other malignant neoplasm of kidney: Secondary | ICD-10-CM | POA: Diagnosis not present

## 2019-06-12 DIAGNOSIS — I10 Essential (primary) hypertension: Secondary | ICD-10-CM | POA: Diagnosis not present

## 2019-06-12 LAB — COMPREHENSIVE METABOLIC PANEL
ALT: 26 U/L (ref 0–53)
AST: 20 U/L (ref 0–37)
Albumin: 4.5 g/dL (ref 3.5–5.2)
Alkaline Phosphatase: 42 U/L (ref 39–117)
BUN: 15 mg/dL (ref 6–23)
CO2: 30 mEq/L (ref 19–32)
Calcium: 10.1 mg/dL (ref 8.4–10.5)
Chloride: 98 mEq/L (ref 96–112)
Creatinine, Ser: 1.07 mg/dL (ref 0.40–1.50)
GFR: 84.93 mL/min (ref >60.00)
Glucose, Bld: 83 mg/dL (ref 70–99)
Potassium: 4.3 mEq/L (ref 3.5–5.1)
Sodium: 138 mEq/L (ref 135–145)
Total Bilirubin: 0.5 mg/dL (ref 0.2–1.2)
Total Protein: 7.1 g/dL (ref 6.0–8.3)

## 2019-06-12 LAB — LIPID PANEL
Cholesterol: 177 mg/dL (ref 0–200)
HDL: 65.1 mg/dL (ref >39.00)
LDL Cholesterol: 90 mg/dL (ref 0–99)
NonHDL: 112.34
Total CHOL/HDL Ratio: 3
Triglycerides: 112 mg/dL (ref 0.0–149.0)
VLDL: 22.4 mg/dL (ref 0.0–40.0)

## 2019-06-12 LAB — CBC WITH DIFFERENTIAL/PLATELET
Basophils Absolute: 0 10*3/uL (ref 0.0–0.1)
Basophils Relative: 0.4 % (ref 0.0–3.0)
Eosinophils Absolute: 0 10*3/uL (ref 0.0–0.7)
Eosinophils Relative: 0.8 % (ref 0.0–5.0)
HCT: 46.8 % (ref 39.0–52.0)
Hemoglobin: 15.6 g/dL (ref 13.0–17.0)
Lymphocytes Relative: 43.2 % (ref 12.0–46.0)
Lymphs Abs: 1.7 10*3/uL (ref 0.7–4.0)
MCHC: 33.4 g/dL (ref 30.0–36.0)
MCV: 87.3 fl (ref 78.0–100.0)
Monocytes Absolute: 0.3 10*3/uL (ref 0.1–1.0)
Monocytes Relative: 6.4 % (ref 3.0–12.0)
Neutro Abs: 1.9 10*3/uL (ref 1.4–7.7)
Neutrophils Relative %: 49.2 % (ref 43.0–77.0)
Platelets: 243 10*3/uL (ref 150.0–400.0)
RBC: 5.35 Mil/uL (ref 4.22–5.81)
RDW: 13 % (ref 11.5–15.5)
WBC: 4 10*3/uL (ref 4.0–10.5)

## 2019-06-12 LAB — PSA: PSA: 1.77 ng/mL (ref 0.10–4.00)

## 2019-06-12 LAB — HEMOGLOBIN A1C: Hgb A1c MFr Bld: 6.9 % — ABNORMAL HIGH (ref 4.6–6.5)

## 2019-06-12 LAB — TSH: TSH: 1.61 u[IU]/mL (ref 0.35–4.50)

## 2019-06-12 MED ORDER — HYDROCHLOROTHIAZIDE 12.5 MG PO CAPS: 12.5000 mg | ORAL_CAPSULE | Freq: Every day | ORAL | 3 refills | Status: DC

## 2019-06-12 NOTE — Progress Notes (Signed)
Subjective:    Patient ID: Michael Church, male    DOB: 10/03/1955, 63 y.o.   MRN: [id number]  HPI  Patient presents for yearly preventative medicine examination. He is a pleasant 63 year old male who  has a past medical history of Complication of anesthesia, Diabetes mellitus without complication ([place]), Hepatitis, Herniated lumbar intervertebral disc, Hypertension, and Renal mass, right.  DM -currently prescribed metformin 500 mg twice daily.  He reports that his last A1c was done at [facility] and was 6.3 in February 2020.  He does monitor his blood sugars at home periodically and reports readings in the 90s to 100s.  He denies symptoms of hypoglycemia  Hypertension-currently controlled with hydrochlorothiazide 12.5 mg. He denies headaches, blurred vision, dizziness, lightheadedness, chest pain, shortness of breath BP Readings from Last 3 Encounters:  06/12/19 (!) 130/92  11/28/18 118/69  06/07/18 130/78   History of right renal mass.  Surgical removal in 2017. He is followed by Oncology yearly.   All immunizations and health maintenance protocols were reviewed with the patient and needed orders were placed.  Appropriate screening laboratory values were ordered for the patient including screening of hyperlipidemia, renal function and hepatic function. If indicated by BPH, a PSA was ordered.  Medication reconciliation,  past medical history, social history, problem list and allergies were reviewed in detail with the patient  Goals were established with regard to weight loss, exercise, and  diet in compliance with medications  He is up-to-date on routine dental and vision screens.  His last colonoscopy was in March 2020 he goes every 5 years due to polyps   Review of Systems  Constitutional: Negative.   HENT: Negative.   Eyes: Negative.   Respiratory: Negative.   Cardiovascular: Negative.   Gastrointestinal: Negative.   Endocrine: Negative.   Genitourinary:  Negative.   Musculoskeletal: Negative.   Skin: Negative.   Allergic/Immunologic: Negative.   Neurological: Negative.   Hematological: Negative.   Psychiatric/Behavioral: Negative.   All other systems reviewed and are negative.  Past Medical History:  Diagnosis Date  . Complication of anesthesia    "didn't numb well with local anesthesia with cervical spine surgery"  . Diabetes mellitus without complication ([place])    oral meds use ; TYPE 2   . Hepatitis    per patient report, his PCP states a small portion of his liver may have been damaged during partial nephrectomy  but liver levels are ok and no implications to his health   . Herniated lumbar intervertebral disc    not a problem presently  . Hypertension   . Renal mass, right    surgery planned 09-07-16    Social History   Socioeconomic History  . Marital status: Married    Spouse name: Seth Bake  . Number of children: 2  . Years of education: Not on file  . Highest education level: Not on file  Occupational History  . Occupation: Code and compliance    Employer: [employer]  . Financial resource strain: Not on file  . Food insecurity    Worry: Not on file    Inability: Not on file  . Transportation needs    Medical: Not on file    Non-medical: Not on file  Tobacco Use  . Smoking status: Never Smoker  . Smokeless tobacco: Never Used  . Tobacco comment: very rare use social  Substance and Sexual Activity  . Alcohol use: Yes    Comment: rare  .  Drug use: No  . Sexual activity: Yes  Lifestyle  . Physical activity    Days per week: Not on file    Minutes per session: Not on file  . Stress: Not on file  Relationships  . Social Herbalist on phone: Not on file    Gets together: Not on file    Attends religious service: Not on file    Active member of club or organization: Not on file    Attends meetings of clubs or organizations: Not on file    Relationship status: Not on file  .  Intimate partner violence    Fear of current or ex partner: Not on file    Emotionally abused: Not on file    Physically abused: Not on file    Forced sexual activity: Not on file  Other Topics Concern  . Not on file  Social History Narrative   Retired from post office    Works part time for the [employer]     Past Surgical History:  Procedure Laterality Date  . CERVICAL SPINE SURGERY  10/2011  . KNEE SURGERY  2017   right knee/ torn meniscus in back  . left knee torn meniscus Left 03/2011  . MASS EXCISION N/A 09/11/2017   Procedure: MINOR EXCISION OF BACK MASS, EXCISION OF GLUTEAL MASS;  Surgeon: Ralene Ok, MD;  Location: [facility];  Service: General;  Laterality: N/A;  . PARTIAL NEPHRECTOMY Right 09/07/2016  . ROBOTIC ASSITED PARTIAL NEPHRECTOMY Right 09/07/2016   Procedure: XI ROBOTIC ASSITED LAPAROSCOPIC RIGHT PARTIAL NEPHRECTOMY;  Surgeon: Ardis Hughs, MD;  Location: [facility];  Service: Urology;  Laterality: Right;  . TONSILLECTOMY     as a child  . VARICOSE VEIN SURGERY     bilateral    Family History  Problem Relation Age of Onset  . Stroke Father        age 71  . Cancer Brother        Hodgkin  . HIV/AIDS Brother        age 38   . Colon cancer Neg Hx   . Esophageal cancer Neg Hx   . Rectal cancer Neg Hx   . Stomach cancer Neg Hx     Allergies  Allergen Reactions  . Other Itching    Foods - cantaloupes, tomatoes, bananas, and  raw carrots.  . Penicillins Itching    Has patient had a PCN reaction causing immediate rash, facial/tongue/throat swelling, SOB or lightheadedness with hypotension: No Has patient had a PCN reaction causing severe rash involving mucus membranes or skin necrosis: No Has patient had a PCN reaction that required hospitalization: No Has patient had a PCN reaction occurring within the last 10 years: No If all of the above answers are "NO", then may proceed with Cephalosporin use.     Current Outpatient Medications on  File Prior to Visit  Medication Sig Dispense Refill  . aspirin EC 81 MG tablet Take 81 mg by mouth daily.    . hydrochlorothiazide (MICROZIDE) 12.5 MG capsule Take 1 capsule (12.5 mg total) by mouth daily. Needs physical exam for more refills 30 capsule 0  . metFORMIN (GLUCOPHAGE) 500 MG tablet TAKE 1 TABLET(500 MG) BY MOUTH TWICE DAILY WITH A MEAL 180 tablet 0  . Multiple Vitamin (MULTIVITAMIN WITH MINERALS) TABS Take 1 tablet by mouth daily.    . saw palmetto 500 MG capsule Take 500 mg by mouth 2 (two) times daily.    Marland Kitchen  TURMERIC PO Take by mouth.     Current Facility-Administered Medications on File Prior to Visit  Medication Dose Route Frequency Provider Last Rate Last Dose  . 0.9 %  sodium chloride infusion  500 mL Intravenous Continuous Armbruster, Carlota Raspberry, MD        BP (!) 130/92 (BP Location: Left Arm, Patient Position: Sitting, Cuff Size: Large)   Pulse 71   Temp 98.6 F (37 C) (Temporal)   Wt 253 lb 12.8 oz (115.1 kg)   SpO2 100%   BMI 34.42 kg/m       Objective:   Physical Exam Vitals signs and nursing note reviewed.  Constitutional:      General: He is not in acute distress.    Appearance: He is obese. He is not diaphoretic.  HENT:     Head: Normocephalic and atraumatic.     Right Ear: Tympanic membrane, ear canal and external ear normal. There is no impacted cerumen.     Left Ear: Tympanic membrane, ear canal and external ear normal. There is no impacted cerumen.     Nose: Nose normal. No congestion or rhinorrhea.     Mouth/Throat:     Mouth: Mucous membranes are moist.     Pharynx: Oropharynx is clear. No oropharyngeal exudate or posterior oropharyngeal erythema.  Eyes:     General: No scleral icterus.       Right eye: No discharge.        Left eye: No discharge.     Extraocular Movements: Extraocular movements intact.     Conjunctiva/sclera: Conjunctivae normal.     Pupils: Pupils are equal, round, and reactive to light.  Neck:     Musculoskeletal: Normal  range of motion and neck supple.     Thyroid: No thyromegaly.     Vascular: No JVD.     Trachea: No tracheal deviation.  Cardiovascular:     Rate and Rhythm: Normal rate and regular rhythm.     Pulses: Normal pulses.     Heart sounds: Normal heart sounds. No murmur. No friction rub. No gallop.   Pulmonary:     Effort: Pulmonary effort is normal. No respiratory distress.     Breath sounds: Normal breath sounds. No stridor. No wheezing, rhonchi or rales.  Chest:     Chest wall: No tenderness.  Abdominal:     General: Bowel sounds are normal. There is no distension.     Palpations: Abdomen is soft. There is no mass.     Tenderness: There is no abdominal tenderness. There is no right CVA tenderness, left CVA tenderness, guarding or rebound.     Hernia: No hernia is present.  Musculoskeletal: Normal range of motion.        General: No swelling, tenderness, deformity or signs of injury.     Right lower leg: No edema.     Left lower leg: No edema.  Lymphadenopathy:     Cervical: No cervical adenopathy.  Skin:    General: Skin is warm and dry.     Capillary Refill: Capillary refill takes less than 2 seconds.     Coloration: Skin is not jaundiced or pale.     Findings: No bruising, erythema, lesion or rash.  Neurological:     General: No focal deficit present.     Mental Status: He is alert and oriented to person, place, and time. Mental status is at baseline.     Cranial Nerves: No cranial nerve deficit.     Sensory:  No sensory deficit.     Motor: No weakness or abnormal muscle tone.     Coordination: Coordination normal.     Gait: Gait normal.     Deep Tendon Reflexes: Reflexes are normal and symmetric. Reflexes normal.  Psychiatric:        Mood and Affect: Mood normal.        Behavior: Behavior normal.        Thought Content: Thought content normal.        Judgment: Judgment normal.       Assessment & Plan:  1. Routine general medical examination at a health care facility  -Encouraged weight loss through heart healthy diet and exercise.  Ideal weight would be about 215 pounds. -Follow-up in 1 year for next CPE or sooner if needed - CBC with Differential/Platelet - Comprehensive metabolic panel - Hemoglobin A1c - Lipid panel - TSH  2. Type 2 diabetes mellitus without complication, without long-term current use of insulin (HCC) - Consider increase in Metformin  -[facility] have him follow-up in 6 months due to being well controlled - CBC with Differential/Platelet - Comprehensive metabolic panel - Hemoglobin A1c - Lipid panel - TSH  3. History of kidney cancer - Follow up with Oncology as directed   4. Essential hypertension - Continue with HCTZ - CBC with Differential/Platelet - Comprehensive metabolic panel - Hemoglobin A1c - Lipid panel - TSH - hydrochlorothiazide (MICROZIDE) 12.5 MG capsule; Take 1 capsule (12.5 mg total) by mouth daily. Needs physical exam for more refills  Dispense: 90 capsule; Refill: 3  5. Prostate cancer screening  - PSA  Dorothyann Peng, NP

## 2019-06-12 NOTE — Addendum Note (Signed)
Addended by: Alverda Skeans on: 06/12/2019 10:53 AM   Modules accepted: Orders

## 2019-06-12 NOTE — Patient Instructions (Signed)
It was great meeting you today   We will follow up with you regarding your blood work   Please work on weight loss through diet and exercise - stick to about 1500-1800 calories per day   I will see you in 6 months

## 2019-09-05 ENCOUNTER — Other Ambulatory Visit: Payer: Self-pay | Admitting: Adult Health

## 2019-09-05 DIAGNOSIS — E119 Type 2 diabetes mellitus without complications: Secondary | ICD-10-CM

## 2019-09-09 NOTE — Telephone Encounter (Signed)
Sent to the pharmacy by e-scribe. 

## 2019-09-14 ENCOUNTER — Other Ambulatory Visit: Payer: Self-pay

## 2019-09-15 ENCOUNTER — Encounter: Payer: Self-pay | Admitting: Adult Health

## 2019-09-15 ENCOUNTER — Ambulatory Visit: Payer: No Typology Code available for payment source | Admitting: Adult Health

## 2019-09-15 VITALS — BP 106/74 | Temp 98.1°F | Wt 247.0 lb

## 2019-09-15 DIAGNOSIS — E119 Type 2 diabetes mellitus without complications: Secondary | ICD-10-CM

## 2019-09-15 LAB — POCT GLYCOSYLATED HEMOGLOBIN (HGB A1C): HbA1c, POC (controlled diabetic range): 6.1 % (ref 0.0–7.0)

## 2019-09-15 NOTE — Progress Notes (Signed)
Subjective:    Patient ID: Michael Church, male    DOB: 11/20/1955, 63 y.o.   MRN: [id number]  HPI 63 year old male who  has a past medical history of Complication of anesthesia, Diabetes mellitus without complication ([place]), Hepatitis, Herniated lumbar intervertebral disc, Hypertension, and Renal mass, right.  He presents to the office today for three month follow up regarding DM. He is currently prescribed Metformin 500 mg BID. He does monitor his blood sugars periodically at home and reports readings in the 90-120's. He denies symptoms of hypoglycemia. He has not experienced nausea, vomiting, or diarrhea.   His last A1c was 6.9.   He has been working on weight loss through diet and exercise. He has been able to lose 6 pounds in the last three months.   Wt Readings from Last 3 Encounters:  09/15/19 247 lb (112 kg)  06/12/19 253 lb 12.8 oz (115.1 kg)  11/28/18 256 lb (116.1 kg)    Review of Systems See HPI   Past Medical History:  Diagnosis Date  . Complication of anesthesia    "didn't numb well with local anesthesia with cervical spine surgery"  . Diabetes mellitus without complication ([place])    oral meds use ; TYPE 2   . Hepatitis    per patient report, his PCP states a small portion of his liver may have been damaged during partial nephrectomy  but liver levels are ok and no implications to his health   . Herniated lumbar intervertebral disc    not a problem presently  . Hypertension   . Renal mass, right    surgery planned 09-07-16    Social History   Socioeconomic History  . Marital status: Married    Spouse name: Seth Bake  . Number of children: 2  . Years of education: Not on file  . Highest education level: Not on file  Occupational History  . Occupation: Code and compliance    Employer: [employer]  Tobacco Use  . Smoking status: Never Smoker  . Smokeless tobacco: Never Used  . Tobacco comment: very rare use social  Substance and Sexual Activity    . Alcohol use: Yes    Comment: rare  . Drug use: No  . Sexual activity: Yes  Other Topics Concern  . Not on file  Social History Narrative   Retired from post office    Works part time for the [employer] Strain:   . Difficulty of Paying Living Expenses: Not on file  Food Insecurity:   . Worried About Charity fundraiser in the Last Year: Not on file  . Ran Out of Food in the Last Year: Not on file  Transportation Needs:   . Lack of Transportation (Medical): Not on file  . Lack of Transportation (Non-Medical): Not on file  Physical Activity:   . Days of Exercise per Week: Not on file  . Minutes of Exercise per Session: Not on file  Stress:   . Feeling of Stress : Not on file  Social Connections:   . Frequency of Communication with Friends and Family: Not on file  . Frequency of Social Gatherings with Friends and Family: Not on file  . Attends Religious Services: Not on file  . Active Member of Clubs or Organizations: Not on file  . Attends Archivist Meetings: Not on file  . Marital Status: Not on file  Intimate  Partner Violence:   . Fear of Current or Ex-Partner: Not on file  . Emotionally Abused: Not on file  . Physically Abused: Not on file  . Sexually Abused: Not on file    Past Surgical History:  Procedure Laterality Date  . CERVICAL SPINE SURGERY  10/2011  . KNEE SURGERY  2017   right knee/ torn meniscus in back  . left knee torn meniscus Left 03/2011  . MASS EXCISION N/A 09/11/2017   Procedure: MINOR EXCISION OF BACK MASS, EXCISION OF GLUTEAL MASS;  Surgeon: Ralene Ok, MD;  Location: [facility];  Service: General;  Laterality: N/A;  . PARTIAL NEPHRECTOMY Right 09/07/2016  . ROBOTIC ASSITED PARTIAL NEPHRECTOMY Right 09/07/2016   Procedure: XI ROBOTIC ASSITED LAPAROSCOPIC RIGHT PARTIAL NEPHRECTOMY;  Surgeon: Ardis Hughs, MD;  Location: [facility];  Service: Urology;  Laterality: Right;   . TONSILLECTOMY     as a child  . VARICOSE VEIN SURGERY     bilateral    Family History  Problem Relation Age of Onset  . Stroke Father        age 39  . Cancer Brother        Hodgkin  . HIV/AIDS Brother        age 48   . Colon cancer Neg Hx   . Esophageal cancer Neg Hx   . Rectal cancer Neg Hx   . Stomach cancer Neg Hx     Allergies  Allergen Reactions  . Other Itching    Foods - cantaloupes, tomatoes, bananas, and  raw carrots.  . Penicillins Itching    Has patient had a PCN reaction causing immediate rash, facial/tongue/throat swelling, SOB or lightheadedness with hypotension: No Has patient had a PCN reaction causing severe rash involving mucus membranes or skin necrosis: No Has patient had a PCN reaction that required hospitalization: No Has patient had a PCN reaction occurring within the last 10 years: No If all of the above answers are "NO", then may proceed with Cephalosporin use.     Current Outpatient Medications on File Prior to Visit  Medication Sig Dispense Refill  . aspirin EC 81 MG tablet Take 81 mg by mouth daily.    . hydrochlorothiazide (MICROZIDE) 12.5 MG capsule Take 1 capsule (12.5 mg total) by mouth daily. Needs physical exam for more refills 90 capsule 3  . metFORMIN (GLUCOPHAGE) 500 MG tablet TAKE 1 TABLET(500 MG) BY MOUTH TWICE DAILY WITH A MEAL 180 tablet 0  . Multiple Vitamin (MULTIVITAMIN WITH MINERALS) TABS Take 1 tablet by mouth daily.    . saw palmetto 500 MG capsule Take 500 mg by mouth 2 (two) times daily.    . TURMERIC PO Take by mouth.     No current facility-administered medications on file prior to visit.    BP 106/74   Temp 98.1 F (36.7 C) (Temporal)   Wt 247 lb (112 kg)   BMI 33.50 kg/m       Objective:   Physical Exam Vitals and nursing note reviewed.  Constitutional:      Appearance: Normal appearance.  Cardiovascular:     Rate and Rhythm: Normal rate and regular rhythm.     Pulses: Normal pulses.     Heart  sounds: Normal heart sounds.  Pulmonary:     Effort: Pulmonary effort is normal.     Breath sounds: Normal breath sounds.  Skin:    Capillary Refill: Capillary refill takes less than 2 seconds.  Neurological:     General:  No focal deficit present.     Mental Status: He is alert and oriented to person, place, and time.  Psychiatric:        Mood and Affect: Mood normal.        Behavior: Behavior normal.        Thought Content: Thought content normal.        Judgment: Judgment normal.       Assessment & Plan:  1. Type 2 diabetes mellitus without complication, without long-term current use of insulin (HCC)  - POCT A1C- 6.1  - A1c has improved. Continue with current therapy. Congratulated on making lifestyle modifications.  - Follow up in 6 months or sooner if needed   Dorothyann Peng, NP

## 2019-12-04 ENCOUNTER — Other Ambulatory Visit: Payer: Self-pay | Admitting: Adult Health

## 2019-12-04 DIAGNOSIS — E119 Type 2 diabetes mellitus without complications: Secondary | ICD-10-CM

## 2019-12-04 NOTE — Telephone Encounter (Signed)
Sent to the pharmacy by e-scribe.  Pt to return in June

## 2020-02-22 LAB — HM DIABETES EYE EXAM

## 2020-02-25 ENCOUNTER — Encounter: Payer: Self-pay | Admitting: Family Medicine

## 2020-03-09 ENCOUNTER — Other Ambulatory Visit: Payer: Self-pay | Admitting: Adult Health

## 2020-03-09 DIAGNOSIS — E119 Type 2 diabetes mellitus without complications: Secondary | ICD-10-CM

## 2020-06-20 ENCOUNTER — Telehealth: Payer: Self-pay

## 2020-06-20 NOTE — Telephone Encounter (Signed)
Patient walked in requesting an appointment for the flu shot, did get him scheduled for this Friday but he is requesting a pneumonia shot the same day, is patient okay to receive both in one visit?

## 2020-06-20 NOTE — Telephone Encounter (Signed)
Does he need another pneumonia vaccine?

## 2020-06-21 NOTE — Telephone Encounter (Signed)
Spoke with the pts wife and informed her of the message below. 

## 2020-06-21 NOTE — Telephone Encounter (Signed)
He is not due for another PNA vaccination until he reaches 64 y/o

## 2020-06-24 ENCOUNTER — Ambulatory Visit (INDEPENDENT_AMBULATORY_CARE_PROVIDER_SITE_OTHER): Payer: No Typology Code available for payment source

## 2020-06-24 ENCOUNTER — Other Ambulatory Visit: Payer: Self-pay

## 2020-06-24 DIAGNOSIS — Z23 Encounter for immunization: Secondary | ICD-10-CM | POA: Diagnosis not present

## 2020-06-26 ENCOUNTER — Other Ambulatory Visit: Payer: Self-pay | Admitting: Adult Health

## 2020-06-26 DIAGNOSIS — E119 Type 2 diabetes mellitus without complications: Secondary | ICD-10-CM

## 2020-07-01 ENCOUNTER — Other Ambulatory Visit: Payer: Self-pay | Admitting: Adult Health

## 2020-07-01 DIAGNOSIS — I10 Essential (primary) hypertension: Secondary | ICD-10-CM

## 2020-07-28 ENCOUNTER — Ambulatory Visit: Payer: No Typology Code available for payment source | Admitting: Adult Health

## 2020-07-28 ENCOUNTER — Encounter: Payer: Self-pay | Admitting: Adult Health

## 2020-07-28 ENCOUNTER — Other Ambulatory Visit: Payer: Self-pay

## 2020-07-28 ENCOUNTER — Ambulatory Visit (INDEPENDENT_AMBULATORY_CARE_PROVIDER_SITE_OTHER): Payer: No Typology Code available for payment source

## 2020-07-28 DIAGNOSIS — M79604 Pain in right leg: Secondary | ICD-10-CM | POA: Diagnosis not present

## 2020-07-28 DIAGNOSIS — M545 Low back pain, unspecified: Secondary | ICD-10-CM

## 2020-07-28 DIAGNOSIS — R1031 Right lower quadrant pain: Secondary | ICD-10-CM | POA: Diagnosis not present

## 2020-07-28 MED ORDER — CYCLOBENZAPRINE HCL 10 MG PO TABS: 10.0000 mg | ORAL_TABLET | Freq: Three times a day (TID) | ORAL | 0 refills | Status: DC | PRN

## 2020-07-28 NOTE — Progress Notes (Signed)
Subjective:    Patient ID: Michael Church, male    DOB: 1955/09/25, 64 y.o.   MRN: [id number]  HPI 64 year old male who  has a past medical history of Complication of anesthesia, Diabetes mellitus without complication ([place]), Hepatitis, Herniated lumbar intervertebral disc, Hypertension, and Renal mass, right.  He presents to the office today for an acute issue of right sided groin pain that radiates to the right lower back. Also pain under testicles where it feels like " I have been kicked in the nuts"    He denies no injury or trauma. Has not experienced dysuria or hematuria. No known history of kidney stones  Pain is constant and worse when walking as well in the morning when he feels more stiff. He feels as though it is hard to walk straight up.   He has tried motrin and heat without much relief.   Wt Readings from Last 3 Encounters:  09/15/19 247 lb (112 kg)  06/12/19 253 lb 12.8 oz (115.1 kg)  11/28/18 256 lb (116.1 kg)   Review of Systems See HPI   Past Medical History:  Diagnosis Date   Complication of anesthesia    "didn't numb well with local anesthesia with cervical spine surgery"   Diabetes mellitus without complication ([place])    oral meds use ; TYPE 2    Hepatitis    per patient report, his PCP states a small portion of his liver may have been damaged during partial nephrectomy  but liver levels are ok and no implications to his health    Herniated lumbar intervertebral disc    not a problem presently   Hypertension    Renal mass, right    surgery planned 09-07-16    Social History   Socioeconomic History   Marital status: Married    Spouse name: Seth Bake   Number of children: 2   Years of education: Not on file   Highest education level: Not on file  Occupational History   Occupation: Code and compliance    Employer: [employer]  Tobacco Use   Smoking status: Never Smoker   Smokeless tobacco: Never Used   Tobacco comment: very  rare use social  Substance and Sexual Activity   Alcohol use: Yes    Comment: rare   Drug use: No   Sexual activity: Yes  Other Topics Concern   Not on file  Social History Narrative   Retired from post office    Works part time for the [place] Strain:    Difficulty of Paying Living Expenses: Not on file  Food Insecurity:    Worried About Charity fundraiser in the Last Year: Not on file   [place] in the Last Year: Not on file  Transportation Needs:    Film/video editor (Medical): Not on file   Lack of Transportation (Non-Medical): Not on file  Physical Activity:    Days of Exercise per Week: Not on file   Minutes of Exercise per Session: Not on file  Stress:    Feeling of Stress : Not on file  Social Connections:    Frequency of Communication with Friends and Family: Not on file   Frequency of Social Gatherings with Friends and Family: Not on file   Attends Religious Services: Not on file   Active Member of Clubs or Organizations: Not on file   Attends Club  or Organization Meetings: Not on file   Marital Status: Not on file  Intimate Partner Violence:    Fear of Current or Ex-Partner: Not on file   Emotionally Abused: Not on file   Physically Abused: Not on file   Sexually Abused: Not on file    Past Surgical History:  Procedure Laterality Date   CERVICAL SPINE SURGERY  10/2011   KNEE SURGERY  2017   right knee/ torn meniscus in back   left knee torn meniscus Left 03/2011   MASS EXCISION N/A 09/11/2017   Procedure: MINOR EXCISION OF BACK MASS, EXCISION OF GLUTEAL MASS;  Surgeon: Ralene Ok, MD;  Location: [facility];  Service: General;  Laterality: N/A;   PARTIAL NEPHRECTOMY Right 09/07/2016   ROBOTIC ASSITED PARTIAL NEPHRECTOMY Right 09/07/2016   Procedure: XI ROBOTIC ASSITED LAPAROSCOPIC RIGHT PARTIAL NEPHRECTOMY;  Surgeon: Ardis Hughs, MD;  Location:  [facility];  Service: Urology;  Laterality: Right;   TONSILLECTOMY     as a child   VARICOSE VEIN SURGERY     bilateral    Family History  Problem Relation Age of Onset   Stroke Father        age 57   Cancer Brother        Hodgkin   HIV/AIDS Brother        age 2    Colon cancer Neg Hx    Esophageal cancer Neg Hx    Rectal cancer Neg Hx    Stomach cancer Neg Hx     Allergies  Allergen Reactions   Other Itching    Foods - cantaloupes, tomatoes, bananas, and  raw carrots.   Penicillins Itching    Has patient had a PCN reaction causing immediate rash, facial/tongue/throat swelling, SOB or lightheadedness with hypotension: No Has patient had a PCN reaction causing severe rash involving mucus membranes or skin necrosis: No Has patient had a PCN reaction that required hospitalization: No Has patient had a PCN reaction occurring within the last 10 years: No If all of the above answers are "NO", then may proceed with Cephalosporin use.     Current Outpatient Medications on File Prior to Visit  Medication Sig Dispense Refill   aspirin EC 81 MG tablet Take 81 mg by mouth daily.     Ferrous Sulfate (IRON PO) Take by mouth daily.     hydrochlorothiazide (MICROZIDE) 12.5 MG capsule TAKE 1 CAPSULE(12.5 MG) BY MOUTH DAILY 90 capsule 3   metFORMIN (GLUCOPHAGE) 500 MG tablet TAKE 1 TABLET(500 MG) BY MOUTH TWICE DAILY WITH A MEAL 180 tablet 0   Multiple Vitamin (MULTIVITAMIN WITH MINERALS) TABS Take 1 tablet by mouth daily.     saw palmetto 500 MG capsule Take 500 mg by mouth 2 (two) times daily.     TURMERIC PO Take by mouth.     No current facility-administered medications on file prior to visit.    There were no vitals taken for this visit.      Objective:   Physical Exam Vitals and nursing note reviewed.  Constitutional:      Appearance: Normal appearance. He is obese.  Cardiovascular:     Rate and Rhythm: Normal rate and regular rhythm.     Pulses: Normal  pulses.     Heart sounds: Normal heart sounds.  Abdominal:     Hernia: There is no hernia in the left inguinal area or right inguinal area.  Genitourinary:    Penis: Normal.      Testes: Normal.  Epididymis:     Right: Normal.     Left: Normal.     Prostate: Normal. Not enlarged, not tender and no nodules present.     Rectum: Normal. Guaiac result negative.  Musculoskeletal:        General: Tenderness present. Normal range of motion.       Back:       Legs:     Comments: Tenderness with palpation throughout right groin.  It had some right groin pain with needed chest but no groin pain with straight leg raise, internal, or external rotation  Lymphadenopathy:     Lower Body: No right inguinal adenopathy. No left inguinal adenopathy.  Skin:    General: Skin is warm and dry.  Neurological:     General: No focal deficit present.     Mental Status: He is alert. Mental status is at baseline. He is disoriented.  Psychiatric:        Mood and Affect: Mood normal.        Behavior: Behavior normal.        Thought Content: Thought content normal.        Judgment: Judgment normal.       Assessment & Plan:  His discomfort seems to be more muscular in nature.  Prostate nontender and nonboggy.  Will check KUB, low back x-ray, and prescribed Flexeril 10 mg, as well as check a urinalysis with microscopic.  We will follow-up once labs and x-ray have resulted.  [employer]

## 2020-07-28 NOTE — Addendum Note (Signed)
Addended by: Angelina Pih on: 07/28/2020 03:37 PM   Modules accepted: Orders

## 2020-07-29 ENCOUNTER — Telehealth: Payer: Self-pay | Admitting: Adult Health

## 2020-07-29 LAB — URINALYSIS, ROUTINE W REFLEX MICROSCOPIC
Bilirubin Urine: NEGATIVE
Hgb urine dipstick: NEGATIVE
Ketones, ur: NEGATIVE
Leukocytes,Ua: NEGATIVE
Nitrite: NEGATIVE
Protein, ur: NEGATIVE
Specific Gravity, Urine: 1.016 (ref 1.001–1.03)
pH: 6 (ref 5.0–8.0)

## 2020-07-29 MED ORDER — TRAMADOL HCL 50 MG PO TABS: 50.0000 mg | ORAL_TABLET | Freq: Every evening | ORAL | 0 refills | Status: DC | PRN

## 2020-07-29 NOTE — Telephone Encounter (Signed)
Updated patient on x-rays and urinalysis all which came back unremarkable.  He did take a muscle relaxer last night and it helped him sleep as he was still pretty stiff in the morning.  We will have him stop the muscle relaxer we will try him on a low-dose tramadol over the weekend that he can take at nighttime and he can take Motrin 600 mg during the day.  If no better by the end of the weekend with rest and medication then he will follow-up with me and we will order an MRI

## 2020-09-21 ENCOUNTER — Encounter: Payer: Self-pay | Admitting: Adult Health

## 2020-09-21 ENCOUNTER — Ambulatory Visit (INDEPENDENT_AMBULATORY_CARE_PROVIDER_SITE_OTHER): Payer: No Typology Code available for payment source | Admitting: Adult Health

## 2020-09-21 ENCOUNTER — Other Ambulatory Visit: Payer: Self-pay

## 2020-09-21 VITALS — BP 116/76 | Temp 98.5°F | Ht 71.75 in | Wt 254.0 lb

## 2020-09-21 DIAGNOSIS — I10 Essential (primary) hypertension: Secondary | ICD-10-CM | POA: Diagnosis not present

## 2020-09-21 DIAGNOSIS — E119 Type 2 diabetes mellitus without complications: Secondary | ICD-10-CM

## 2020-09-21 DIAGNOSIS — Z0000 Encounter for general adult medical examination without abnormal findings: Secondary | ICD-10-CM | POA: Diagnosis not present

## 2020-09-21 DIAGNOSIS — Z125 Encounter for screening for malignant neoplasm of prostate: Secondary | ICD-10-CM | POA: Diagnosis not present

## 2020-09-21 DIAGNOSIS — Z85528 Personal history of other malignant neoplasm of kidney: Secondary | ICD-10-CM

## 2020-09-21 LAB — LIPID PANEL
Cholesterol: 181 mg/dL (ref 0–200)
HDL: 55.9 mg/dL (ref >39.00)
LDL Cholesterol: 103 mg/dL — ABNORMAL HIGH (ref 0–99)
NonHDL: 125.41
Total CHOL/HDL Ratio: 3
Triglycerides: 112 mg/dL (ref 0.0–149.0)
VLDL: 22.4 mg/dL (ref 0.0–40.0)

## 2020-09-21 LAB — COMPREHENSIVE METABOLIC PANEL
ALT: 20 U/L (ref 0–53)
AST: 17 U/L (ref 0–37)
Albumin: 4.4 g/dL (ref 3.5–5.2)
Alkaline Phosphatase: 43 U/L (ref 39–117)
BUN: 15 mg/dL (ref 6–23)
CO2: 28 mEq/L (ref 19–32)
Calcium: 9.3 mg/dL (ref 8.4–10.5)
Chloride: 100 mEq/L (ref 96–112)
Creatinine, Ser: 1.1 mg/dL (ref 0.40–1.50)
GFR: 71.91 mL/min (ref >60.00)
Glucose, Bld: 107 mg/dL — ABNORMAL HIGH (ref 70–99)
Potassium: 4.1 mEq/L (ref 3.5–5.1)
Sodium: 136 mEq/L (ref 135–145)
Total Bilirubin: 0.6 mg/dL (ref 0.2–1.2)
Total Protein: 6.9 g/dL (ref 6.0–8.3)

## 2020-09-21 LAB — MICROALBUMIN / CREATININE URINE RATIO
Creatinine,U: 111.4 mg/dL
Microalb Creat Ratio: 0.6 mg/g (ref 0.0–30.0)
Microalb, Ur: <0.7 mg/dL (ref 0.0–1.9)

## 2020-09-21 LAB — CBC WITH DIFFERENTIAL/PLATELET
Basophils Absolute: 0 10*3/uL (ref 0.0–0.1)
Basophils Relative: 0.2 % (ref 0.0–3.0)
Eosinophils Absolute: 0.1 10*3/uL (ref 0.0–0.7)
Eosinophils Relative: 1.2 % (ref 0.0–5.0)
HCT: 46.2 % (ref 39.0–52.0)
Hemoglobin: 15.6 g/dL (ref 13.0–17.0)
Lymphocytes Relative: 36 % (ref 12.0–46.0)
Lymphs Abs: 1.6 10*3/uL (ref 0.7–4.0)
MCHC: 33.8 g/dL (ref 30.0–36.0)
MCV: 85.9 fl (ref 78.0–100.0)
Monocytes Absolute: 0.3 10*3/uL (ref 0.1–1.0)
Monocytes Relative: 5.9 % (ref 3.0–12.0)
Neutro Abs: 2.6 10*3/uL (ref 1.4–7.7)
Neutrophils Relative %: 56.7 % (ref 43.0–77.0)
Platelets: 216 10*3/uL (ref 150.0–400.0)
RBC: 5.38 Mil/uL (ref 4.22–5.81)
RDW: 13 % (ref 11.5–15.5)
WBC: 4.5 10*3/uL (ref 4.0–10.5)

## 2020-09-21 LAB — PSA: PSA: 1.37 ng/mL (ref 0.10–4.00)

## 2020-09-21 LAB — HEMOGLOBIN A1C: Hgb A1c MFr Bld: 7 % — ABNORMAL HIGH (ref 4.6–6.5)

## 2020-09-21 LAB — TSH: TSH: 1.93 u[IU]/mL (ref 0.35–4.50)

## 2020-09-21 NOTE — Progress Notes (Signed)
Subjective:    Patient ID: Michael Church, male    DOB: 23-Jul-1956, 65 y.o.   MRN: [id number]  HPI Patient presents for yearly preventative medicine examination. He is a pleasant 65 year old male who  has a past medical history of Complication of anesthesia, Diabetes mellitus without complication ([place]), Hepatitis, Herniated lumbar intervertebral disc, Hypertension, and Renal mass, right.  DM -currently prescribed metformin 500 mg twice daily.  His last A1c was done in December 2020 at which time it was 6.1.  He failed to follow-up for his 47-month follow-up.  He has not been checking his blood sugars at home. He has not had any symptoms of hypoglycemia.   Lab Results  Component Value Date   HGBA1C 6.1 09/15/2019   Essential Hypertension -takes HCTZ 12.5 mg daily.  He denies dizziness, lightheadedness, chest pain, shortness of breath, or syncopal episodes BP Readings from Last 3 Encounters:  09/21/20 116/76  09/15/19 106/74  06/12/19 (!) 130/92   H/o right renal mass -surgical removal in 2017?  He was released from care by oncology.  Denies hematuria or other urinary symptoms  All immunizations and health maintenance protocols were reviewed with the patient and needed orders were placed.  Appropriate screening laboratory values were ordered for the patient including screening of hyperlipidemia, renal function and hepatic function. If indicated by BPH, a PSA was ordered.  Medication reconciliation,  past medical history, social history, problem list and allergies were reviewed in detail with the patient  Goals were established with regard to weight loss, exercise, and  diet in compliance with medications. He is walking and trying to eat healthy.   Wt Readings from Last 3 Encounters:  09/21/20 254 lb (115.2 kg)  09/15/19 247 lb (112 kg)  06/12/19 253 lb 12.8 oz (115.1 kg)   He is up-to-date on routine dental, vision and colon cancer screening.   Review of Systems   Constitutional: Negative.   HENT: Negative.   Eyes: Negative.   Respiratory: Negative.   Cardiovascular: Negative.   Gastrointestinal: Negative.   Endocrine: Negative.   Genitourinary: Negative.   Musculoskeletal: Negative.   Skin: Negative.   Allergic/Immunologic: Negative.   Neurological: Negative.   Hematological: Negative.   Psychiatric/Behavioral: Negative.   All other systems reviewed and are negative.  Past Medical History:  Diagnosis Date  . Complication of anesthesia    "didn't numb well with local anesthesia with cervical spine surgery"  . Diabetes mellitus without complication ([place])    oral meds use ; TYPE 2   . Hepatitis    per patient report, his PCP states a small portion of his liver may have been damaged during partial nephrectomy  but liver levels are ok and no implications to his health   . Herniated lumbar intervertebral disc    not a problem presently  . Hypertension   . Renal mass, right    surgery planned 09-07-16    Social History   Socioeconomic History  . Marital status: Married    Spouse name: Seth Bake  . Number of children: 2  . Years of education: Not on file  . Highest education level: Not on file  Occupational History  . Occupation: Code and compliance    Employer: [employer]  Tobacco Use  . Smoking status: Never Smoker  . Smokeless tobacco: Never Used  . Tobacco comment: very rare use social  Substance and Sexual Activity  . Alcohol use: Yes    Comment: rare  . Drug  use: No  . Sexual activity: Yes  Other Topics Concern  . Not on file  Social History Narrative   Retired from post office    Works part time for the [employer]    Social Determinants of Corporate investment bankerHealth   Financial Resource Strain: Not on [employer]  Food Insecurity: Not on file  Transportation Needs: Not on file  Physical Activity: Not on file  Stress: Not on file  Social Connections: Not on file  Intimate Partner Violence: Not on file    Past Surgical  History:  Procedure Laterality Date  . CERVICAL SPINE SURGERY  10/2011  . KNEE SURGERY  2017   right knee/ torn meniscus in back  . left knee torn meniscus Left 03/2011  . MASS EXCISION N/A 09/11/2017   Procedure: MINOR EXCISION OF BACK MASS, EXCISION OF GLUTEAL MASS;  Surgeon: Axel Filleramirez, Armando, MD;  Location: [facility];  Service: General;  Laterality: N/A;  . PARTIAL NEPHRECTOMY Right 09/07/2016  . ROBOTIC ASSITED PARTIAL NEPHRECTOMY Right 09/07/2016   Procedure: XI ROBOTIC ASSITED LAPAROSCOPIC RIGHT PARTIAL NEPHRECTOMY;  Surgeon: Crist FatBenjamin W Herrick, MD;  Location: [facility];  Service: Urology;  Laterality: Right;  . TONSILLECTOMY     as a child  . VARICOSE VEIN SURGERY     bilateral    Family History  Problem Relation Age of Onset  . Stroke Father        age 63  . Cancer Brother        Hodgkin  . HIV/AIDS Brother        age 63   . Colon cancer Neg Hx   . Esophageal cancer Neg Hx   . Rectal cancer Neg Hx   . Stomach cancer Neg Hx     Allergies  Allergen Reactions  . Other Itching    Foods - cantaloupes, tomatoes, bananas, and  raw carrots.  . Penicillins Itching    Has patient had a PCN reaction causing immediate rash, facial/tongue/throat swelling, SOB or lightheadedness with hypotension: No Has patient had a PCN reaction causing severe rash involving mucus membranes or skin necrosis: No Has patient had a PCN reaction that required hospitalization: No Has patient had a PCN reaction occurring within the last 10 years: No If all of the above answers are "NO", then may proceed with Cephalosporin use.     Current Outpatient Medications on File Prior to Visit  Medication Sig Dispense Refill  . aspirin EC 81 MG tablet Take 81 mg by mouth daily.    . Ferrous Sulfate (IRON PO) Take by mouth daily.    . hydrochlorothiazide (MICROZIDE) 12.5 MG capsule TAKE 1 CAPSULE(12.5 MG) BY MOUTH DAILY 90 capsule 3  . metFORMIN (GLUCOPHAGE) 500 MG tablet TAKE 1 TABLET(500 MG) BY MOUTH TWICE  DAILY WITH A MEAL 180 tablet 0  . Multiple Vitamin (MULTIVITAMIN WITH MINERALS) TABS Take 1 tablet by mouth daily.    . saw palmetto 500 MG capsule Take 500 mg by mouth 2 (two) times daily.    . TURMERIC PO Take by mouth.     No current facility-administered medications on file prior to visit.    BP 116/76   Temp 98.5 F (36.9 C)   Ht 5' 11.75" (1.822 m) Comment: WITH SHOES  Wt 254 lb (115.2 kg)   BMI 34.69 kg/m       Objective:   Physical Exam Vitals and nursing note reviewed.  Constitutional:      General: He is not in acute distress.  Appearance: Normal appearance. He is well-developed and normal weight.  HENT:     Head: Normocephalic and atraumatic.     Right Ear: Tympanic membrane, ear canal and external ear normal. There is no impacted cerumen.     Left Ear: Tympanic membrane, ear canal and external ear normal. There is no impacted cerumen.     Nose: Nose normal. No congestion or rhinorrhea.     Mouth/Throat:     Mouth: Mucous membranes are moist.     Pharynx: Oropharynx is clear. No oropharyngeal exudate or posterior oropharyngeal erythema.  Eyes:     General:        Right eye: No discharge.        Left eye: No discharge.     Extraocular Movements: Extraocular movements intact.     Conjunctiva/sclera: Conjunctivae normal.     Pupils: Pupils are equal, round, and reactive to light.  Neck:     Vascular: No carotid bruit.     Trachea: No tracheal deviation.  Cardiovascular:     Rate and Rhythm: Normal rate and regular rhythm.     Pulses: Normal pulses.     Heart sounds: Normal heart sounds. No murmur heard. No friction rub. No gallop.   Pulmonary:     Effort: Pulmonary effort is normal. No respiratory distress.     Breath sounds: Normal breath sounds. No stridor. No wheezing, rhonchi or rales.  Chest:     Chest wall: No tenderness.  Abdominal:     General: Bowel sounds are normal. There is no distension.     Palpations: Abdomen is soft. There is no mass.      Tenderness: There is no abdominal tenderness. There is no right CVA tenderness, left CVA tenderness, guarding or rebound.     Hernia: No hernia is present.  Musculoskeletal:        General: No swelling, tenderness, deformity or signs of injury. Normal range of motion.     Right lower leg: No edema.     Left lower leg: No edema.  Lymphadenopathy:     Cervical: No cervical adenopathy.  Skin:    General: Skin is warm and dry.     Capillary Refill: Capillary refill takes less than 2 seconds.     Coloration: Skin is not jaundiced or pale.     Findings: No bruising, erythema, lesion or rash.  Neurological:     General: No focal deficit present.     Mental Status: He is alert and oriented to person, place, and time.     Cranial Nerves: No cranial nerve deficit.     Sensory: No sensory deficit.     Motor: No weakness.     Coordination: Coordination normal.     Gait: Gait normal.     Deep Tendon Reflexes: Reflexes normal.  Psychiatric:        Mood and Affect: Mood normal.        Behavior: Behavior normal.        Thought Content: Thought content normal.        Judgment: Judgment normal.       Assessment & Plan:  1. Routine general medical examination at a health care facility - Continue to exercise and eat healty  - Follow up in one year for CPE or sooner for any acute issues  - CBC with Differential/Platelet; Future - Comprehensive metabolic panel; Future - Hemoglobin A1c; Future - Lipid panel; Future - TSH; Future  2. Type 2 diabetes mellitus without complication,  without long-term current use of insulin ([facility]) - Consider increase in metformin  - 3-6 month follow up  - CBC with Differential/Platelet; Future - Comprehensive metabolic panel; Future - Hemoglobin A1c; Future - Lipid panel; Future - TSH; Future - Microalbumin / creatinine urine ratio; Future  3. History of kidney cancer  - CBC with Differential/Platelet; Future - Comprehensive metabolic panel; Future -  Hemoglobin A1c; Future - Lipid panel; Future - TSH; Future  4. Essential hypertension - Well controlled. No change in medication  - CBC with Differential/Platelet; Future - Comprehensive metabolic panel; Future - Hemoglobin A1c; Future - Lipid panel; Future - TSH; Future  5. Prostate cancer screening  - PSA; Future

## 2020-09-21 NOTE — Patient Instructions (Signed)
It was great seeing you today   We will follow up with you regarding your blood work   Please follow up in 6 months for diabetes check

## 2020-09-22 ENCOUNTER — Other Ambulatory Visit: Payer: Self-pay | Admitting: Adult Health

## 2020-09-22 DIAGNOSIS — E119 Type 2 diabetes mellitus without complications: Secondary | ICD-10-CM

## 2020-09-23 NOTE — Telephone Encounter (Signed)
Sent to the pharmacy by e-scribe for 3 months. 

## 2020-12-16 ENCOUNTER — Ambulatory Visit: Payer: No Typology Code available for payment source | Admitting: Adult Health

## 2020-12-16 ENCOUNTER — Encounter: Payer: Self-pay | Admitting: Adult Health

## 2020-12-16 ENCOUNTER — Other Ambulatory Visit: Payer: Self-pay

## 2020-12-16 VITALS — BP 130/80 | HR 78 | Temp 98.1°F | Ht 71.75 in | Wt 257.6 lb

## 2020-12-16 DIAGNOSIS — I10 Essential (primary) hypertension: Secondary | ICD-10-CM

## 2020-12-16 DIAGNOSIS — E119 Type 2 diabetes mellitus without complications: Secondary | ICD-10-CM | POA: Diagnosis not present

## 2020-12-16 LAB — POCT GLYCOSYLATED HEMOGLOBIN (HGB A1C): Hemoglobin A1C: 7 % — AB (ref 4.0–5.6)

## 2020-12-16 MED ORDER — BLOOD GLUCOSE MONITOR KIT
PACK | 0 refills | Status: AC
Start: 2020-12-16 — End: ?

## 2020-12-16 MED ORDER — METFORMIN HCL 500 MG PO TABS: ORAL_TABLET | ORAL | 0 refills | Status: DC

## 2020-12-16 NOTE — Progress Notes (Signed)
Subjective:    Patient ID: Michael Church, male    DOB: [birth year], 65 y.o.   MRN: [id number]  HPI 65 year old male who  has a past medical history of Complication of anesthesia, Diabetes mellitus without complication ([place]), Hepatitis, Herniated lumbar intervertebral disc, Hypertension, and Renal mass, right.  He presents to the office today for 26-month follow-up regarding diabetes and hypertension.  DM-prescribed Metformin 500 mg twice daily.  During his CPE in January 2021 his A1c had increased to 6.1-7.0.  He has been checking his blood sugars at home periodically and has had blood sugars in the 120-130 and denies any symptoms of hypoglycemia.  He has been trying to work on lifestyle modifications and cut back on portion size.  Lab Results  Component Value Date   HGBA1C 7.0 (H) 09/21/2020   Essential Hypertension -takes  HCTZ 12.5 mg daily.  He denies dizziness, lightheadedness, chest pain, shortness of breath, or syncopal episodes BP Readings from Last 3 Encounters:  12/16/20 130/80  09/21/20 116/76  09/15/19 106/74    Review of Systems See HPI   Past Medical History:  Diagnosis Date  . Complication of anesthesia    "didn't numb well with local anesthesia with cervical spine surgery"  . Diabetes mellitus without complication ([employer])    oral meds use ; TYPE 2   . Hepatitis    per patient report, his PCP states a small portion of his liver may have been damaged during partial nephrectomy  but liver levels are ok and no implications to his health   . Herniated lumbar intervertebral disc    not a problem presently  . Hypertension   . Renal mass, right    surgery planned 09-07-16    Social History   Socioeconomic History  . Marital status: Married    Spouse name: Seth Bake  . Number of children: 2  . Years of education: Not on file  . Highest education level: Not on file  Occupational History  . Occupation: Code and compliance    Employer: [employer]  Tobacco  Use  . Smoking status: Never Smoker  . Smokeless tobacco: Never Used  . Tobacco comment: very rare use social  Substance and Sexual Activity  . Alcohol use: Yes    Comment: rare  . Drug use: No  . Sexual activity: Yes  Other Topics Concern  . Not on file  Social History Narrative   Retired from post office    Works part time for the [employer] Strain: Not on [employer] Insecurity: Not on file  Transportation Needs: Not on file  Physical Activity: Not on file  Stress: Not on file  Social Connections: Not on file  Intimate Partner Violence: Not on file    Past Surgical History:  Procedure Laterality Date  . CERVICAL SPINE SURGERY  10/2011  . KNEE SURGERY  2017   right knee/ torn meniscus in back  . left knee torn meniscus Left 03/2011  . MASS EXCISION N/A 09/11/2017   Procedure: MINOR EXCISION OF BACK MASS, EXCISION OF GLUTEAL MASS;  Surgeon: Ralene Ok, MD;  Location: [facility];  Service: General;  Laterality: N/A;  . PARTIAL NEPHRECTOMY Right 09/07/2016  . ROBOTIC ASSITED PARTIAL NEPHRECTOMY Right 09/07/2016   Procedure: XI ROBOTIC ASSITED LAPAROSCOPIC RIGHT PARTIAL NEPHRECTOMY;  Surgeon: Ardis Hughs, MD;  Location: [facility];  Service: Urology;  Laterality: Right;  . TONSILLECTOMY  as a child  . VARICOSE VEIN SURGERY     bilateral    Family History  Problem Relation Age of Onset  . Stroke Father        age 39  . Cancer Brother        Hodgkin  . HIV/AIDS Brother        age 73   . Colon cancer Neg Hx   . Esophageal cancer Neg Hx   . Rectal cancer Neg Hx   . Stomach cancer Neg Hx     Allergies  Allergen Reactions  . Other Itching    Foods - cantaloupes, tomatoes, bananas, and  raw carrots.  . Penicillins Itching    Has patient had a PCN reaction causing immediate rash, facial/tongue/throat swelling, SOB or lightheadedness with hypotension: No Has patient had a PCN reaction causing  severe rash involving mucus membranes or skin necrosis: No Has patient had a PCN reaction that required hospitalization: No Has patient had a PCN reaction occurring within the last 10 years: No If all of the above answers are "NO", then may proceed with Cephalosporin use.     Current Outpatient Medications on File Prior to Visit  Medication Sig Dispense Refill  . aspirin EC 81 MG tablet Take 81 mg by mouth daily.    . Ferrous Sulfate (IRON PO) Take by mouth daily.    . hydrochlorothiazide (MICROZIDE) 12.5 MG capsule TAKE 1 CAPSULE(12.5 MG) BY MOUTH DAILY 90 capsule 3  . metFORMIN (GLUCOPHAGE) 500 MG tablet TAKE 1 TABLET(500 MG) BY MOUTH TWICE DAILY WITH A MEAL 180 tablet 0  . Multiple Vitamin (MULTIVITAMIN WITH MINERALS) TABS Take 1 tablet by mouth daily.    . saw palmetto 500 MG capsule Take 500 mg by mouth 2 (two) times daily.    . TURMERIC PO Take by mouth.     No current facility-administered medications on file prior to visit.    BP 130/80 (BP Location: Left Arm, Patient Position: Sitting, Cuff Size: Large)   Pulse 78   Temp 98.1 F (36.7 C) (Oral)   Ht 5' 11.75" (1.822 m)   Wt 257 lb 9.6 oz (116.8 kg)   SpO2 97%   BMI 35.18 kg/m       Objective:   Physical Exam Vitals and nursing note reviewed.  Constitutional:      Appearance: Normal appearance.  Cardiovascular:     Rate and Rhythm: Normal rate and regular rhythm.     Pulses: Normal pulses.     Heart sounds: Normal heart sounds.  Pulmonary:     Effort: Pulmonary effort is normal.     Breath sounds: Normal breath sounds.  Neurological:     Mental Status: He is alert.  Psychiatric:        Mood and Affect: Mood normal.        Behavior: Behavior normal.        Thought Content: Thought content normal.        Judgment: Judgment normal.       Assessment & Plan:  1. Type 2 diabetes mellitus without complication, without long-term current use of insulin ([facility])  - blood glucose meter kit and supplies KIT; Dispense  based on patient and insurance preference. Use up to three times daily as directed.  Dispense: 1 each; Refill: 0 - metFORMIN (GLUCOPHAGE) 500 MG tablet; TAKE 1 TABLET(500 MG) BY MOUTH TWICE DAILY WITH A MEAL  Dispense: 180 tablet; Refill: 0 - POC HgB A1c- 7.0  - No change -  Continue with Metformin 500 mg BID  - Work on lifestyle modifications  - Follow up in three months   2. Essential hypertension - Well controlled.  - No change in medications   Dorothyann Peng, NP

## 2021-01-23 ENCOUNTER — Encounter: Payer: Self-pay | Admitting: Adult Health

## 2021-01-31 ENCOUNTER — Ambulatory Visit
Admission: EM | Admit: 2021-01-31 | Discharge: 2021-01-31 | Disposition: A | Discharge status: Home / Self Care | Payer: No Typology Code available for payment source | Attending: Student | Admitting: Student

## 2021-01-31 ENCOUNTER — Other Ambulatory Visit: Payer: Self-pay

## 2021-01-31 ENCOUNTER — Encounter: Payer: Self-pay | Admitting: Emergency Medicine

## 2021-01-31 DIAGNOSIS — R040 Epistaxis: Secondary | ICD-10-CM

## 2021-01-31 MED ORDER — OXYMETAZOLINE HCL 0.05 % NA SOLN
1.0000 | Freq: Once | NASAL | Status: AC
  Administered 2021-01-31: 1 via NASAL

## 2021-01-31 NOTE — ED Triage Notes (Signed)
Patient presents to [facility] for evaluation of spontaneous nose bleed starting around 630am.  Patient states it poured out until he pushed gauze up into his nose.  Unsure if it is still bleeding.

## 2021-01-31 NOTE — ED Provider Notes (Signed)
[name]    CSN: [id number] Arrival date & time: 01/31/21  0800      History   Chief Complaint Chief Complaint  Patient presents with  . Epistaxis    HPI Michael Church is a 65 y.o. male presenting with epistaxis.  Endorses 1 episode of left-sided spontaneous epistaxis that occurred at 630 this morning (2 hours ago).  States he was simply drinking his coffee when this happened.  He leaned backwards and the blood started going down his throat.  Placed some gauze in the nostril which did seem to help.  Denies absolutely any other symptoms including headaches, dizziness, weakness in arms or legs, chest pain, shortness of breath.  Allergic rhinitis that is moderately well controlled on nasal saline and Flonase, does not take medication for this.  States his dad did have an aneurysm later in his life and patient is concerned about this. Denies trauma, picking, blowing nose. Last nosebleed was 2 months ago.  HPI  Past Medical History:  Diagnosis Date  . Complication of anesthesia    "didn't numb well with local anesthesia with cervical spine surgery"  . Diabetes mellitus without complication ([place])    oral meds use ; TYPE 2   . Hepatitis    per patient report, his PCP states a small portion of his liver may have been damaged during partial nephrectomy  but liver levels are ok and no implications to his health   . Herniated lumbar intervertebral disc    not a problem presently  . Hypertension   . Renal mass, right    surgery planned 09-07-16    Patient Active Problem List   Diagnosis Date Noted  . Portal vein thrombosis 04/04/2017  . History of kidney cancer 10/15/2016  . BMI 34.0-34.9,adult 08/25/2016  . Type 2 diabetes mellitus without complications ([place]) 85/10/7739  . Essential hypertension 09/22/2012  . Varicose vein of leg 09/22/2012    Past Surgical History:  Procedure Laterality Date  . CERVICAL SPINE SURGERY  10/2011  . KNEE SURGERY  2017   right  knee/ torn meniscus in back  . left knee torn meniscus Left 03/2011  . MASS EXCISION N/A 09/11/2017   Procedure: MINOR EXCISION OF BACK MASS, EXCISION OF GLUTEAL MASS;  Surgeon: Ralene Ok, MD;  Location: [facility];  Service: General;  Laterality: N/A;  . PARTIAL NEPHRECTOMY Right 09/07/2016  . ROBOTIC ASSITED PARTIAL NEPHRECTOMY Right 09/07/2016   Procedure: XI ROBOTIC ASSITED LAPAROSCOPIC RIGHT PARTIAL NEPHRECTOMY;  Surgeon: Ardis Hughs, MD;  Location: [facility];  Service: Urology;  Laterality: Right;  . TONSILLECTOMY     as a child  . VARICOSE VEIN SURGERY     bilateral       Home Medications    Prior to Admission medications   Medication Sig Start Date End Date Taking? Authorizing Provider  aspirin EC 81 MG tablet Take 81 mg by mouth daily.    [provider]  blood glucose meter kit and supplies KIT Dispense based on patient and insurance preference. Use up to three times daily as directed. 12/16/20   Nafziger, Tommi Rumps, NP  Ferrous Sulfate (IRON PO) Take by mouth daily.    [provider]  hydrochlorothiazide (MICROZIDE) 12.5 MG capsule TAKE 1 CAPSULE(12.5 MG) BY MOUTH DAILY 07/02/20   Nafziger, Tommi Rumps, NP  metFORMIN (GLUCOPHAGE) 500 MG tablet TAKE 1 TABLET(500 MG) BY MOUTH TWICE DAILY WITH A MEAL 12/16/20   Nafziger, Tommi Rumps, NP  Multiple Vitamin (MULTIVITAMIN WITH MINERALS) TABS  Take 1 tablet by mouth daily.    [provider]  saw palmetto 500 MG capsule Take 500 mg by mouth 2 (two) times daily.    [provider]  TURMERIC PO Take by mouth.    [provider]    Family History Family History  Problem Relation Age of Onset  . Stroke Father        age 57  . Cancer Brother        Hodgkin  . HIV/AIDS Brother        age 56   . Colon cancer Neg Hx   . Esophageal cancer Neg Hx   . Rectal cancer Neg Hx   . Stomach cancer Neg Hx     Social History Social History   Tobacco Use  . Smoking status: Never Smoker  . Smokeless  tobacco: Never Used  . Tobacco comment: very rare use social  Substance Use Topics  . Alcohol use: Yes    Comment: rare  . Drug use: No     Allergies   Other and Penicillins   Review of Systems Review of Systems  HENT: Positive for nosebleeds.   All other systems reviewed and are negative.    Physical Exam Triage Vital Signs ED Triage Vitals  Enc Vitals Group     BP      Pulse      Resp      Temp      Temp src      SpO2      Weight      Height      Head Circumference      Peak Flow      Pain Score      Pain Loc      Pain Edu?      Excl. in GC?    No data found.  Updated Vital Signs BP 130/87 (BP Location: Left Arm)   Pulse 72   Temp 98.3 F (36.8 C) (Temporal)   Resp 18   SpO2 97%   Visual Acuity Right Eye Distance:   Left Eye Distance:   Bilateral Distance:    Right Eye Near:   Left Eye Near:    Bilateral Near:     Physical Exam Vitals reviewed.  Constitutional:      General: He is not in acute distress.    Appearance: Normal appearance. He is not ill-appearing or diaphoretic.  HENT:     Head: Normocephalic and atraumatic.     Nose:     Comments: Dried blood visible inside and outside L nare. No active bleeding. No pulsatile veins.    Mouth/Throat:     Comments: Blood visible posterior pharynx. No pharyngeal swelling Cardiovascular:     Rate and Rhythm: Normal rate and regular rhythm.     Heart sounds: Normal heart sounds.  Pulmonary:     Effort: Pulmonary effort is normal.     Breath sounds: Normal breath sounds.  Skin:    General: Skin is warm.  Neurological:     General: No focal deficit present.     Mental Status: He is alert and oriented to person, place, and time.     Cranial Nerves: Cranial nerves are intact. No cranial nerve deficit.     Sensory: Sensation is intact. No sensory deficit.     Motor: Motor function is intact. No weakness.     Coordination: Coordination is intact. Romberg sign negative.     Gait: Gait is intact.  Gait normal.     Comments: CN 2-12 grossly intact. PERRLA, EOMI  Psychiatric:        Mood and Affect: Mood normal.        Behavior: Behavior normal.        Thought Content: Thought content normal.        Judgment: Judgment normal.      UC Treatments / Results  Labs (all labs ordered are listed, but only abnormal results are displayed) Labs Reviewed - No data to display  EKG   Radiology No results found.  Procedures Procedures (including critical care time)  Medications Ordered in UC Medications  oxymetazoline (AFRIN) 0.05 % nasal spray 1 spray (1 spray Each Nare Given 01/31/21 0827)    Initial Impression / Assessment and Plan / UC Course  I have reviewed the triage vital signs and the nursing notes.  Pertinent labs & imaging results that were available during my care of the patient were reviewed by me and considered in my medical decision making (see chart for details).     This patient is a 65 year old male presenting with epistaxis that has resolved.  There is still dried blood visible outside the left nare and some blood visible in posterior pharynx.  Cranial nerves intact.  Afrin administered and provided.  Recommended follow-up with PCP in about 1 day to ensure resolution.  If new symptoms like dizziness, weakness-instructed to emergency department.  Suspect patient will require ENT referral as he has had multiple nosebleeds in the last 6 months.  ED return precautions discussed  Final Clinical Impressions(s) / UC Diagnoses   Final diagnoses:  Epistaxis     Discharge Instructions     -Afrin as needed for nosebleeds. -When you have a nosebleed, pinch your nostrils together and lean forward.  This will prevent blood from dripping down the back of your throat. -Follow-up with your primary care provider in 1 to 2 days to ensure resolution of symptoms. -If you develop new symptoms like dizziness, lightheadedness, weakness in arms or legs, shortness of breath, chest  pain-head to the emergency department or call 911 immediately. -If you develop chronic nosebleeds, you will see an ear nose and throat doctor to discuss further management, you can discuss this referral with your primary care tomorrow.    ED Prescriptions    None     PDMP not reviewed this encounter.   Hazel Sams, PA-C 01/31/21 [phone]

## 2021-01-31 NOTE — Discharge Instructions (Addendum)
-  Afrin as needed for nosebleeds. -When you have a nosebleed, pinch your nostrils together and lean forward.  This will prevent blood from dripping down the back of your throat. -Follow-up with your primary care provider in 1 to 2 days to ensure resolution of symptoms. -If you develop new symptoms like dizziness, lightheadedness, weakness in arms or legs, shortness of breath, chest pain-head to the emergency department or call 911 immediately. -If you develop chronic nosebleeds, you will see an ear nose and throat doctor to discuss further management, you can discuss this referral with your primary care tomorrow.

## 2021-02-01 ENCOUNTER — Emergency Department (HOSPITAL_COMMUNITY)
Admission: EM | Admit: 2021-02-01 | Discharge: 2021-02-01 | Disposition: A | Discharge status: Home / Self Care | Payer: No Typology Code available for payment source | Attending: Emergency Medicine | Admitting: Emergency Medicine

## 2021-02-01 ENCOUNTER — Encounter: Payer: Self-pay | Admitting: Adult Health

## 2021-02-01 DIAGNOSIS — I10 Essential (primary) hypertension: Secondary | ICD-10-CM | POA: Insufficient documentation

## 2021-02-01 DIAGNOSIS — R040 Epistaxis: Secondary | ICD-10-CM | POA: Insufficient documentation

## 2021-02-01 DIAGNOSIS — Z7982 Long term (current) use of aspirin: Secondary | ICD-10-CM | POA: Diagnosis not present

## 2021-02-01 DIAGNOSIS — Z79899 Other long term (current) drug therapy: Secondary | ICD-10-CM | POA: Insufficient documentation

## 2021-02-01 DIAGNOSIS — E119 Type 2 diabetes mellitus without complications: Secondary | ICD-10-CM | POA: Diagnosis not present

## 2021-02-01 DIAGNOSIS — Z7984 Long term (current) use of oral hypoglycemic drugs: Secondary | ICD-10-CM | POA: Insufficient documentation

## 2021-02-01 NOTE — ED Provider Notes (Signed)
[facility] Provider Note   CSN: [id number] Arrival date & time: 02/01/21  1005     History No chief complaint on file.   Michael Church is a 65 y.o. male.  65 year old male presents with complaint left-sided nosebleed today.  Patient states that he had a brief nosebleed last week and then a another nosebleed yesterday, went to urgent care who sprayed Afrin and bleeding resolved.  Patient was advised to present to the emergency room.  No further nosebleeds.  Patient states he is improved today and bleeding began on the left side, however.  Is not on any blood thinners.  No history of trauma to the nose.  Other complaints or concerns.        Past Medical History:  Diagnosis Date  . Complication of anesthesia    "didn't numb well with local anesthesia with cervical spine surgery"  . Diabetes mellitus without complication ([place])    oral meds use ; TYPE 2   . Hepatitis    per patient report, his PCP states a small portion of his liver may have been damaged during partial nephrectomy  but liver levels are ok and no implications to his health   . Herniated lumbar intervertebral disc    not a problem presently  . Hypertension   . Renal mass, right    surgery planned 09-07-16    Patient Active Problem List   Diagnosis Date Noted  . Portal vein thrombosis 04/04/2017  . History of kidney cancer 10/15/2016  . BMI 34.0-34.9,adult 08/25/2016  . Type 2 diabetes mellitus without complications ([place]) 06/09/3006  . Essential hypertension 09/22/2012  . Varicose vein of leg 09/22/2012    Past Surgical History:  Procedure Laterality Date  . CERVICAL SPINE SURGERY  10/2011  . KNEE SURGERY  2017   right knee/ torn meniscus in back  . left knee torn meniscus Left 03/2011  . MASS EXCISION N/A 09/11/2017   Procedure: MINOR EXCISION OF BACK MASS, EXCISION OF GLUTEAL MASS;  Surgeon: Ralene Ok, MD;  Location: [facility];  Service: General;   Laterality: N/A;  . PARTIAL NEPHRECTOMY Right 09/07/2016  . ROBOTIC ASSITED PARTIAL NEPHRECTOMY Right 09/07/2016   Procedure: XI ROBOTIC ASSITED LAPAROSCOPIC RIGHT PARTIAL NEPHRECTOMY;  Surgeon: Ardis Hughs, MD;  Location: [facility];  Service: Urology;  Laterality: Right;  . TONSILLECTOMY     as a child  . VARICOSE VEIN SURGERY     bilateral       Family History  Problem Relation Age of Onset  . Stroke Father        age 56  . Cancer Brother        Hodgkin  . HIV/AIDS Brother        age 102   . Colon cancer Neg Hx   . Esophageal cancer Neg Hx   . Rectal cancer Neg Hx   . Stomach cancer Neg Hx     Social History   Tobacco Use  . Smoking status: Never Smoker  . Smokeless tobacco: Never Used  . Tobacco comment: very rare use social  Substance Use Topics  . Alcohol use: Yes    Comment: rare  . Drug use: No    Home Medications Prior to Admission medications   Medication Sig Start Date End Date Taking? Authorizing Provider  aspirin EC 81 MG tablet Take 81 mg by mouth daily.   Yes [provider]  Coenzyme Q10 (COQ10 PO) Take 1 tablet by mouth  daily.   Yes [provider]  Ferrous Sulfate (IRON PO) Take 1 tablet by mouth daily.   Yes [provider]  hydrochlorothiazide (MICROZIDE) 12.5 MG capsule TAKE 1 CAPSULE(12.5 MG) BY MOUTH DAILY Patient taking differently: Take 12.5 mg by mouth daily. 07/02/20  Yes Nafziger, Tommi Rumps, NP  metFORMIN (GLUCOPHAGE) 500 MG tablet TAKE 1 TABLET(500 MG) BY MOUTH TWICE DAILY WITH A MEAL Patient taking differently: Take 500 mg by mouth 2 (two) times daily with a meal. 12/16/20  Yes Nafziger, Tommi Rumps, NP  Multiple Vitamin (MULTIVITAMIN WITH MINERALS) TABS Take 1 tablet by mouth daily.   Yes [provider]  oxymetazoline (AFRIN) 0.05 % nasal spray Place 1 spray into both nostrils 2 (two) times daily as needed (nose bleeds).   Yes [provider]  saw palmetto 500 MG capsule Take 500 mg by mouth 2 (two)  times daily.   Yes [provider]  sodium chloride (OCEAN) 0.65 % SOLN nasal spray Place 1 spray into both nostrils as needed for congestion.   Yes [provider]  TURMERIC PO Take 1 tablet by mouth daily.   Yes [provider]  blood glucose meter kit and supplies KIT Dispense based on patient and insurance preference. Use up to three times daily as directed. 12/16/20   Dorothyann Peng, NP    Allergies    Other and Penicillins  Review of Systems   Review of Systems  Constitutional: Negative for fever.  HENT: Positive for nosebleeds.   Skin: Negative for rash and wound.  Allergic/Immunologic: Positive for immunocompromised state.  Neurological: Negative for dizziness, weakness and headaches.  Hematological: Does not bruise/bleed easily.  All other systems reviewed and are negative.   Physical Exam Updated Vital Signs BP 140/87   Pulse 69   Temp 98.7 F (37.1 C)   Resp 16   SpO2 96%   Physical Exam Vitals and nursing note reviewed.  Constitutional:      General: He is not in acute distress.    Appearance: He is well-developed. He is not diaphoretic.  HENT:     Head: Normocephalic and atraumatic.     Nose: Nose normal.     Mouth/Throat:     Mouth: Mucous membranes are moist.     Pharynx: No oropharyngeal exudate or posterior oropharyngeal erythema.  Eyes:     Conjunctiva/sclera: Conjunctivae normal.  Pulmonary:     Effort: Pulmonary effort is normal.  Musculoskeletal:     Cervical back: Neck supple. No tenderness.  Skin:    General: Skin is warm and dry.     Findings: No erythema or rash.  Neurological:     Mental Status: He is alert and oriented to person, place, and time.  Psychiatric:        Behavior: Behavior normal.     ED Results / Procedures / Treatments   Labs (all labs ordered are listed, but only abnormal results are displayed) Labs Reviewed - No data to display  EKG None  Radiology No results  found.  Procedures Procedures   Medications Ordered in ED Medications - No data to display  ED Course  I have reviewed the triage vital signs and the nursing notes.  Pertinent labs & imaging results that were available during my care of the patient were reviewed by me and considered in my medical decision making (see chart for details).  Clinical Course as of 02/01/21 1200  Wed Feb 01, 2661  [id number] 65 year-old male with complaint of intermittent  left-sided epistasis.  On exam, no active bleeding, unable to identify source of bleeding at this time.  Plan is to monitor patient, if no further bleeding will plan to discharge with referral to ENT, discussed management with Afrin and pressure and return to ER precautions. [LM]  1200 Patient was observed for 30 minutes, no further bleeding on recheck.  Plan is to follow-up with ENT, use Afrin and pressure in the meantime and return to ER if needed. [LM]    Clinical Course User Index [LM] Roque Lias   MDM Rules/Calculators/A&P                          Final Clinical Impression(s) / ED Diagnoses Final diagnoses:  Epistaxis    Rx / DC Orders ED Discharge Orders    None       Tacy Learn, PA-C 02/01/21 1200    Quintella Reichert, MD 02/02/21 0710

## 2021-02-01 NOTE — ED Notes (Signed)
Vitals at 10:29 for this pt listed are not meant for this pt

## 2021-02-01 NOTE — ED Notes (Signed)
RN reviewed discharge instructions. Follow up and afrin use reviewed, pt had no further questions

## 2021-02-01 NOTE — ED Triage Notes (Signed)
Pt here for nose bleed left side , was seen UC for same yesterday was sent home with afrin but did not have it with him

## 2021-02-01 NOTE — ED Triage Notes (Signed)
No bleeding at present 

## 2021-02-01 NOTE — Discharge Instructions (Addendum)
If bleeding returns- Blow your nose to remove any clots. Spray Afrin to both sides of nose. Pinch the soft part of your nose shut and hold constant pressure for 15 minutes (use a timer, do not release pressure to check early).  If bleeding is not controlled or is heavy or you feel unwell, return to the ER. Otherwise- follow up with ENT, you can call today to schedule an appointment.

## 2021-02-02 ENCOUNTER — Other Ambulatory Visit: Payer: Self-pay

## 2021-02-03 ENCOUNTER — Encounter: Payer: Self-pay | Admitting: Family Medicine

## 2021-02-03 ENCOUNTER — Ambulatory Visit: Payer: No Typology Code available for payment source | Admitting: Family Medicine

## 2021-02-03 VITALS — BP 128/78 | HR 89 | Temp 98.4°F | Wt 250.0 lb

## 2021-02-03 DIAGNOSIS — R040 Epistaxis: Secondary | ICD-10-CM

## 2021-02-03 NOTE — Progress Notes (Signed)
   Subjective:    Patient ID: Michael Church, male    DOB: November 30, 1955, 65 y.o.   MRN: [id number]  HPI Here for 5 days of recurrent left sided nose bleeds. No recent trauma. No bleeding issues otherwise. His CBC in January was normal. He does use Flonase sprays fairly frequently. His nose tends to stop up at night and he snores a lot. He went to the ED twice on 01-31-21 and 02-01-21, and each times he was treated with Afrin. He has used Afrin at home as well. Going outside in the heat or taking a hot shower will often start the bleeding. He uses saline sprays occasionally. He is scheduled to see Dr. Lucia Gaskins (ENT) on 03-07-21.   Review of Systems  Constitutional: Negative.   HENT: Positive for congestion and nosebleeds. Negative for postnasal drip, rhinorrhea, sinus pain, sneezing and sore throat.   Eyes: Negative.   Respiratory: Negative.   Hematological: Does not bruise/bleed easily.       Objective:   Physical Exam Constitutional:      Appearance: Normal appearance. He is not ill-appearing.  HENT:     Right Ear: Tympanic membrane, ear canal and external ear normal.     Left Ear: Tympanic membrane, ear canal and external ear normal.     Nose:     Comments: Right nostril is clear. The left nostril has an irritated area with a blood clot present on the lateral side. The septum is mildly deviated     Mouth/Throat:     Pharynx: Oropharynx is clear.  Eyes:     Conjunctiva/sclera: Conjunctivae normal.  Pulmonary:     Effort: Pulmonary effort is normal.     Breath sounds: Normal breath sounds.  Lymphadenopathy:     Cervical: No cervical adenopathy.  Neurological:     Mental Status: He is alert.           Assessment & Plan:  Recurrent nosebleeds from a vessel that is overly dry and irritated. I advised him to stop using Flonase. He will increase his use of saline sprays to 4-6 times a day with the idea of keeping the membranes moist. See Dr. Lucia Gaskins as above. He may require a  cauterization.  Alysia Penna, MD

## 2021-03-07 ENCOUNTER — Ambulatory Visit (INDEPENDENT_AMBULATORY_CARE_PROVIDER_SITE_OTHER): Payer: No Typology Code available for payment source | Admitting: Otolaryngology

## 2021-03-07 ENCOUNTER — Other Ambulatory Visit: Payer: Self-pay

## 2021-03-07 DIAGNOSIS — R0683 Snoring: Secondary | ICD-10-CM | POA: Diagnosis not present

## 2021-03-07 DIAGNOSIS — J342 Deviated nasal septum: Secondary | ICD-10-CM

## 2021-03-07 DIAGNOSIS — Z87898 Personal history of other specified conditions: Secondary | ICD-10-CM

## 2021-03-07 DIAGNOSIS — J310 Chronic rhinitis: Secondary | ICD-10-CM | POA: Diagnosis not present

## 2021-03-07 MED ORDER — TRIAMCINOLONE ACETONIDE 55 MCG/ACT NA AERO: 2.0000 | INHALATION_SPRAY | Freq: Every day | NASAL | 12 refills | Status: AC

## 2021-03-07 NOTE — Progress Notes (Addendum)
HPI: Michael Church is a 64 y.o. male who presents is referred by Lelon Frohlich, MD for evaluation of epistaxis.  Patient apparently had 3 consecutive days of left-sided nosebleed several weeks ago.  He has had no bleeding over the last 2 to 3 weeks.  He felt like it was secondary to use of Flonase.  He has chronic nasal congestion and also increased snoring.  He denies symptoms of obstructive sleep apnea and feels like he sleeps well but his wife complains that his snoring is worse.Marland Kitchen He does have history of allergies and is on allergy shots.  Past Medical History:  Diagnosis Date   Complication of anesthesia    "didn't numb well with local anesthesia with cervical spine surgery"   Diabetes mellitus without complication ([place])    oral meds use ; TYPE 2    Hepatitis    per patient report, his PCP states a small portion of his liver may have been damaged during partial nephrectomy  but liver levels are ok and no implications to his health    Herniated lumbar intervertebral disc    not a problem presently   Hypertension    Renal mass, right    surgery planned 09-07-16   Past Surgical History:  Procedure Laterality Date   CERVICAL SPINE SURGERY  10/2011   KNEE SURGERY  2017   right knee/ torn meniscus in back   left knee torn meniscus Left 03/2011   MASS EXCISION N/A 09/11/2017   Procedure: MINOR EXCISION OF BACK MASS, EXCISION OF GLUTEAL MASS;  Surgeon: Ralene Ok, MD;  Location: [facility];  Service: General;  Laterality: N/A;   PARTIAL NEPHRECTOMY Right 09/07/2016   ROBOTIC ASSITED PARTIAL NEPHRECTOMY Right 09/07/2016   Procedure: XI ROBOTIC ASSITED LAPAROSCOPIC RIGHT PARTIAL NEPHRECTOMY;  Surgeon: Ardis Hughs, MD;  Location: [facility];  Service: Urology;  Laterality: Right;   TONSILLECTOMY     as a child   VARICOSE VEIN SURGERY     bilateral   Social History   Socioeconomic History   Marital status: Married    Spouse name: Seth Bake   Number of children: 2    Years of education: Not on file   Highest education level: Not on file  Occupational History   Occupation: Code and compliance    Employer: [employer]  Tobacco Use   Smoking status: Never   Smokeless tobacco: Never   Tobacco comments:    very rare use social  Substance and Sexual Activity   Alcohol use: Yes    Comment: rare   Drug use: No   Sexual activity: Yes  Other Topics Concern   Not on file  Social History Narrative   Retired from post office    Works part time for the [employer] Strain: Not on [employer] Insecurity: Not on file  Transportation Needs: Not on file  Physical Activity: Not on file  Stress: Not on file  Social Connections: Not on file   Family History  Problem Relation Age of Onset   Stroke Father        age 15   Cancer Brother        Hodgkin   HIV/AIDS Brother        age 63    Colon cancer Neg Hx    Esophageal cancer Neg Hx    Rectal cancer Neg Hx    Stomach cancer Neg Hx  Allergies  Allergen Reactions   Other Itching    Foods - cantaloupes, tomatoes, bananas, and  raw carrots.   Penicillins Itching    Has patient had a PCN reaction causing immediate rash, facial/tongue/throat swelling, SOB or lightheadedness with hypotension: No Has patient had a PCN reaction causing severe rash involving mucus membranes or skin necrosis: No Has patient had a PCN reaction that required hospitalization: No Has patient had a PCN reaction occurring within the last 10 years: No If all of the above answers are "NO", then may proceed with Cephalosporin use.    Prior to Admission medications   Medication Sig Start Date End Date Taking? Authorizing Provider  aspirin EC 81 MG tablet Take 81 mg by mouth daily.    [provider]  blood glucose meter kit and supplies KIT Dispense based on patient and insurance preference. Use up to three times daily as directed. 12/16/20   Nafziger, Tommi Rumps,  NP  Coenzyme Q10 (COQ10 PO) Take 1 tablet by mouth daily.    [provider]  Ferrous Sulfate (IRON PO) Take 1 tablet by mouth daily.    [provider]  hydrochlorothiazide (MICROZIDE) 12.5 MG capsule TAKE 1 CAPSULE(12.5 MG) BY MOUTH DAILY Patient taking differently: Take 12.5 mg by mouth daily. 07/02/20   Nafziger, Tommi Rumps, NP  metFORMIN (GLUCOPHAGE) 500 MG tablet TAKE 1 TABLET(500 MG) BY MOUTH TWICE DAILY WITH A MEAL Patient taking differently: Take 500 mg by mouth 2 (two) times daily with a meal. 12/16/20   Nafziger, Tommi Rumps, NP  Multiple Vitamin (MULTIVITAMIN WITH MINERALS) TABS Take 1 tablet by mouth daily.    [provider]  oxymetazoline (AFRIN) 0.05 % nasal spray Place 1 spray into both nostrils 2 (two) times daily as needed (nose bleeds).    [provider]  saw palmetto 500 MG capsule Take 500 mg by mouth 2 (two) times daily.    [provider]  sodium chloride (OCEAN) 0.65 % SOLN nasal spray Place 1 spray into both nostrils as needed for congestion.    [provider]  TURMERIC PO Take 1 tablet by mouth daily.    [provider]     Positive ROS: Otherwise negative  All other systems have been reviewed and were otherwise negative with the exception of those mentioned in the HPI and as above.  Physical Exam: Constitutional: Alert, well-appearing, no acute distress Ears: External ears without lesions or tenderness. Ear canals are clear bilaterally with intact, clear TMs.  Nasal: External nose without lesions. Septum is deviated to the right.  He has moderate size inferior turbinates.  After decongesting the nose on nasal endoscopy both middle meatus regions were clear with no signs of infection.  No polyps noted.  He does have a slightly prominent vessel anteriorly along the left side of septum but this has not bled in several weeks.  Remaining nasal cavity is clear. Oral: Lips and gums without lesions. Tongue and palate mucosa  without lesions. Posterior oropharynx clear.  Small average sized tonsils bilaterally. Neck: No palpable adenopathy or masses Respiratory: Breathing comfortably  Skin: No facial/neck lesions or rash noted.  Procedures  Assessment: History of left-sided epistaxis which is stopped since stopping the Flonase. Chronic rhinitis with nasal obstruction. Snoring.  Plan: Suggested trying nasal steroid spray Nasacort as this has slight less incidence of nosebleeds and recommended when spraying the nose to aim the spray up toward the corner of the eye instead of toward the septum.  This should provide some  relief of nasal congestion.  And perhaps will help with the snoring.  I sent a prescription for Nasacort to his pharmacy [facility] on [address] If he continues to have difficulty with his breathing could consider septoplasty and turbinate reductions as he does have a moderate septal deviation to the right.   Radene Journey, MD   CC:

## 2021-03-13 ENCOUNTER — Other Ambulatory Visit: Payer: Self-pay | Admitting: Adult Health

## 2021-03-13 DIAGNOSIS — E119 Type 2 diabetes mellitus without complications: Secondary | ICD-10-CM

## 2021-03-16 ENCOUNTER — Other Ambulatory Visit: Payer: Self-pay

## 2021-03-17 ENCOUNTER — Encounter: Payer: Self-pay | Admitting: Adult Health

## 2021-03-17 ENCOUNTER — Ambulatory Visit: Payer: No Typology Code available for payment source | Admitting: Adult Health

## 2021-03-17 VITALS — BP 110/80 | HR 74 | Temp 98.2°F | Ht 71.75 in | Wt 249.0 lb

## 2021-03-17 DIAGNOSIS — I10 Essential (primary) hypertension: Secondary | ICD-10-CM | POA: Diagnosis not present

## 2021-03-17 DIAGNOSIS — E119 Type 2 diabetes mellitus without complications: Secondary | ICD-10-CM | POA: Diagnosis not present

## 2021-03-17 LAB — POCT GLYCOSYLATED HEMOGLOBIN (HGB A1C): Hemoglobin A1C: 6.4 % — AB (ref 4.0–5.6)

## 2021-03-17 NOTE — Progress Notes (Signed)
Subjective:    Patient ID: Michael Church, male    DOB: 1956-03-06, 65 y.o.   MRN: [id number]  HPI 65 year old male who  has a past medical history of Complication of anesthesia, Diabetes mellitus without complication ([place]), Hepatitis, Herniated lumbar intervertebral disc, Hypertension, and Renal mass, right.  He presents to the office today for 56-monthfollow-up regarding hypertension and diabetes  DM -prescribed metformin 500 mg twice daily.  He does check his blood sugars at home periodically and has noticed readings in the 120s to 130 with an average of 115.  He denies symptoms of hypo or hyperglycemia.  He has been trying to work on lifestyle modifications and cut back on portion size. He is exercising more often. His last A1c was 7.0 three month ago.  Lab Results  Component Value Date   HGBA1C 6.4 (A) 03/17/2021   Wt Readings from Last 3 Encounters:  03/17/21 249 lb (112.9 kg)  02/03/21 250 lb (113.4 kg)  12/16/20 257 lb 9.6 oz (116.8 kg)   HTN -takes HCTZ 12.5 mg daily.  He denies dizziness, lightheadedness, chest pain, shortness of breath, syncopal episodes BP Readings from Last 3 Encounters:  03/17/21 110/80  02/03/21 128/78  02/01/21 133/89    Review of Systems See HPI   Past Medical History:  Diagnosis Date   Complication of anesthesia    "didn't numb well with local anesthesia with cervical spine surgery"   [place]    oral meds use ; TYPE 2    Hepatitis    per patient report, his PCP states a small portion of his liver may have been damaged during partial nephrectomy  but liver levels are ok and no implications to his health    Herniated lumbar intervertebral disc    not a problem presently   Hypertension    Renal mass, right    surgery planned 09-07-16    Social History   Socioeconomic History   Marital status: Married    Spouse name: ASeth Bake  Number of children: 2   Years of education: Not on file   Highest  education level: Not on file  Occupational History   Occupation: Code and compliance    Employer: [employer]  Tobacco Use   Smoking status: Never   Smokeless tobacco: Never   Tobacco comments:    very rare use social  Substance and Sexual Activity   Alcohol use: Yes    Comment: rare   Drug use: No   Sexual activity: Yes  Other Topics Concern   Not on file  Social History Narrative   Retired from post office    Works part time for the [employer] assistantStrain: Not on file  Food Insecurity: Not on file  Transportation Needs: Not on file  Physical Activity: Not on file  Stress: Not on file  Social Connections: Not on file  Intimate Partner Violence: Not on file    Past Surgical History:  Procedure Laterality Date   CERVICAL SPINE SURGERY  10/2011   KNEE SURGERY  2017   right knee/ torn meniscus in back   left knee torn meniscus Left 03/2011   MASS EXCISION N/A 09/11/2017   Procedure: [place] EXCISION OF GL[place]AL MASS;  Surgeon: RRalene Ok MD;  Location: [facility];  Service: General;  Laterality: N/A;   PARTIAL NEPHRECTOMY Right 09/07/2016   ROBOTIC  ASSITED PARTIAL NEPHRECTOMY Right 09/07/2016   Procedure: XI ROBOTIC ASSITED LAPAROSCOPIC RIGHT PARTIAL NEPHRECTOMY;  Surgeon: Ardis Hughs, MD;  Location: [facility];  Service: Urology;  Laterality: Right;   TONSILLECTOMY     as a child   VARICOSE VEIN SURGERY     bilateral    Family History  Problem Relation Age of Onset   Stroke Father        age 37   Cancer Brother        Hodgkin   HIV/AIDS Brother        age 50    Colon cancer Neg Hx    Esophageal cancer Neg Hx    Rectal cancer Neg Hx    Stomach cancer Neg Hx     Allergies  Allergen Reactions   Other Itching    Foods - cantaloupes, tomatoes, bananas, and  raw carrots.   Penicillins Itching    Has patient had a PCN reaction causing immediate rash, facial/tongue/throat  swelling, SOB or lightheadedness with hypotension: No Has patient had a PCN reaction causing severe rash involving mucus membranes or skin necrosis: No Has patient had a PCN reaction that required hospitalization: No Has patient had a PCN reaction occurring within the last 10 years: No If all of the above answers are "NO", then may proceed with Cephalosporin use.     Current Outpatient Medications on File Prior to Visit  Medication Sig Dispense Refill   blood glucose meter kit and supplies KIT Dispense based on patient and insurance preference. Use up to three times daily as directed. 1 each 0   Coenzyme Q10 (COQ10 PO) Take 1 tablet by mouth daily.     Ferrous Sulfate (IRON PO) Take 1 tablet by mouth daily.     hydrochlorothiazide (MICROZIDE) 12.5 MG capsule TAKE 1 CAPSULE(12.5 MG) BY MOUTH DAILY (Patient taking differently: Take 12.5 mg by mouth daily.) 90 capsule 3   metFORMIN (GLUCOPHAGE) 500 MG tablet TAKE 1 TABLET(500 MG) BY MOUTH TWICE DAILY WITH A MEAL 180 tablet 0   Multiple Vitamin (MULTIVITAMIN WITH MINERALS) TABS Take 1 tablet by mouth daily.     saw palmetto 500 MG capsule Take 500 mg by mouth 2 (two) times daily.     sodium chloride (OCEAN) 0.65 % SOLN nasal spray Place 1 spray into both nostrils as needed for congestion.     triamcinolone (NASACORT) 55 MCG/ACT AERO nasal inhaler Place 2 sprays into the nose daily. 2 sprays each nostril at night 1 each 12   TURMERIC PO Take 1 tablet by mouth daily.     aspirin EC 81 MG tablet Take 81 mg by mouth daily. (Patient not taking: Reported on 03/17/2021)     oxymetazoline (AFRIN) 0.05 % nasal spray Place 1 spray into both nostrils 2 (two) times daily as needed (nose bleeds). (Patient not taking: Reported on 03/17/2021)     No current facility-administered medications on file prior to visit.    BP 110/80   Pulse 74   Temp 98.2 F (36.8 C) (Oral)   Ht 5' 11.75" (1.822 m)   Wt 249 lb (112.9 kg)   SpO2 97%   BMI 34.01 kg/m        Objective:   Physical Exam Vitals and nursing note reviewed.  Constitutional:      Appearance: Normal appearance.  Cardiovascular:     Rate and Rhythm: Normal rate and regular rhythm.     Pulses: Normal pulses.     Heart sounds: Normal heart  sounds.  Pulmonary:     Effort: Pulmonary effort is normal.     Breath sounds: Normal breath sounds.  Musculoskeletal:        General: Normal range of motion.  Skin:    General: Skin is warm and dry.     Capillary Refill: Capillary refill takes less than 2 seconds.  Neurological:     General: No focal deficit present.     Mental Status: He is alert and oriented to person, place, and time.  Psychiatric:        Mood and Affect: Mood normal.        Behavior: Behavior normal.        Thought Content: Thought content normal.        Judgment: Judgment normal.      Assessment & Plan:  1. Type 2 diabetes mellitus without complication, without long-term current use of insulin (HCC)  - POC HgB A1c- 6.4  - At goal and improved  - Follow up in 6 months for CPE   2. Essential hypertension - At goal - No change in medications   Dorothyann Peng, NP

## 2021-03-21 ENCOUNTER — Ambulatory Visit: Payer: No Typology Code available for payment source | Admitting: Adult Health

## 2021-04-28 ENCOUNTER — Telehealth (INDEPENDENT_AMBULATORY_CARE_PROVIDER_SITE_OTHER): Payer: No Typology Code available for payment source | Admitting: Adult Health

## 2021-04-28 ENCOUNTER — Encounter: Payer: Self-pay | Admitting: Adult Health

## 2021-04-28 VITALS — HR 86 | Ht 71.75 in | Wt 249.0 lb

## 2021-04-28 DIAGNOSIS — W57XXXA Bitten or stung by nonvenomous insect and other nonvenomous arthropods, initial encounter: Secondary | ICD-10-CM | POA: Diagnosis not present

## 2021-04-28 DIAGNOSIS — S80869A Insect bite (nonvenomous), unspecified lower leg, initial encounter: Secondary | ICD-10-CM

## 2021-04-28 MED ORDER — HYDROXYZINE HCL 50 MG PO TABS: 50.0000 mg | ORAL_TABLET | Freq: Three times a day (TID) | ORAL | 0 refills | Status: DC | PRN

## 2021-04-28 MED ORDER — PREDNISONE 20 MG PO TABS: 20.0000 mg | ORAL_TABLET | Freq: Every day | ORAL | 0 refills | Status: DC

## 2021-04-28 NOTE — Progress Notes (Signed)
Virtual Visit via Video Note  I connected with Michael Church on 04/28/21 at  4:00 PM EDT by a video enabled telemedicine application and verified that I am speaking with the correct person using two identifiers.  Location patient: home Location provider:work or home office Persons participating in the virtual visit: patient, provider  I discussed the limitations of evaluation and management by telemedicine and the availability of in person appointments. The patient expressed understanding and agreed to proceed.   HPI: Exceed 65-year-old male who is being evaluated today for an acute issue.  Late last week while at work he was walking through tall grass and wooded areas, he did not spray down his clothes with insecticide like he usually does.  Over the week he has developed red itchy spots throughout both legs into his groin.  He has not noticed any ticks.  Has been using Benadryl cream, cortisone cream just to help with the itching but nothing seems to be really working.   ROS: See pertinent positives and negatives per HPI.  Past Medical History:  Diagnosis Date   Complication of anesthesia    "didn't numb well with local anesthesia with cervical spine surgery"   Diabetes mellitus without complication ([place])    oral meds use ; TYPE 2    Hepatitis    per patient report, his PCP states a small portion of his liver may have been damaged during partial nephrectomy  but liver levels are ok and no implications to his health    Herniated lumbar intervertebral disc    not a problem presently   Hypertension    Renal mass, right    surgery planned 09-07-16    Past Surgical History:  Procedure Laterality Date   CERVICAL SPINE SURGERY  10/2011   KNEE SURGERY  2017   right knee/ torn meniscus in back   left knee torn meniscus Left 03/2011   MASS EXCISION N/A 09/11/2017   Procedure: MINOR EXCISION OF BACK MASS, EXCISION OF GLUTEAL MASS;  Surgeon: Ralene Ok, MD;  [facility];   Service: General;  Laterality: N/A;   PARTIAL NEPHRECTOMY Right 09/07/2016   ROBOTIC ASSITED PARTIAL NEPHRECTOMY Right 09/07/2016   Procedure: XI ROBOTIC ASSITED LAPAROSCOPIC RIGHT PARTIAL NEPHRECTOMY;  Surgeon: Ardis Hughs, MD;  [facility];  Service: Urology;  Laterality: Right;   TONSILLECTOMY     as a child   VARICOSE VEIN SURGERY     bilateral    Family History  Problem Relation Age of Onset   Stroke Father        age 44   Cancer Brother        Hodgkin   HIV/AIDS Brother        age 56    Colon cancer Neg Hx    Esophageal cancer Neg Hx    Rectal cancer Neg Hx    Stomach cancer Neg Hx        Current Outpatient Medications:    aspirin EC 81 MG tablet, Take 81 mg by mouth daily., Disp: , Rfl:    blood glucose meter kit and supplies KIT, Dispense based on patient and insurance preference. Use up to three times daily as directed., Disp: 1 each, Rfl: 0   Coenzyme Q10 (COQ10 PO), Take 1 tablet by mouth daily., Disp: , Rfl:    Ferrous Sulfate (IRON PO), Take 1 tablet by mouth daily., Disp: , Rfl:    hydrochlorothiazide (MICROZIDE) 12.5 MG capsule, TAKE 1 CAPSULE(12.5 MG) BY MOUTH DAILY (Patient  taking differently: Take 12.5 mg by mouth daily.), Disp: 90 capsule, Rfl: 3   metFORMIN (GLUCOPHAGE) 500 MG tablet, TAKE 1 TABLET(500 MG) BY MOUTH TWICE DAILY WITH A MEAL, Disp: 180 tablet, Rfl: 0   Multiple Vitamin (MULTIVITAMIN WITH MINERALS) TABS, Take 1 tablet by mouth daily., Disp: , Rfl:    oxymetazoline (AFRIN) 0.05 % nasal spray, Place 1 spray into both nostrils 2 (two) times daily as needed (nose bleeds)., Disp: , Rfl:    saw palmetto 500 MG capsule, Take 500 mg by mouth 2 (two) times daily., Disp: , Rfl:    sodium chloride (OCEAN) 0.65 % SOLN nasal spray, Place 1 spray into both nostrils as needed for congestion., Disp: , Rfl:    triamcinolone (NASACORT) 55 MCG/ACT AERO nasal inhaler, Place 2 sprays into the nose daily. 2 sprays each nostril at night, Disp: 1 each, Rfl:  12   TURMERIC PO, Take 1 tablet by mouth daily., Disp: , Rfl:   EXAM:  VITALS per patient if applicable:  GENERAL: alert, oriented, appears well and in no acute distress  HEENT: atraumatic, conjunttiva clear, no obvious abnormalities on inspection of external nose and ears  NECK: normal movements of the head and neck  LUNGS: on inspection no signs of respiratory distress, breathing rate appears normal, no obvious gross SOB, gasping or wheezing  CV: no obvious cyanosis  MS: moves all visible extremities without noticeable abnormality  PSYCH/NEURO: pleasant and cooperative, no obvious depression or anxiety, speech and thought processing grossly intact  SKIN: Bright red papules noted throughout both legs, appears as chigger bites  ASSESSMENT AND PLAN:  1. Insect bite of lower leg, unspecified laterality, initial encounter  - hydrOXYzine (ATARAX/VISTARIL) 50 MG tablet; Take 1 tablet (50 mg total) by mouth 3 (three) times daily as needed.  Dispense: 30 tablet; Refill: 0 - predniSONE (DELTASONE) 20 MG tablet; Take 1 tablet (20 mg total) by mouth daily with breakfast.  Dispense: 5 tablet; Refill: 0 - Follow up as needed  Discussed the following assessment and plan:         I discussed the assessment and treatment plan with the patient. The patient was provided an opportunity to ask questions and all were answered. The patient agreed with the plan and demonstrated an understanding of the instructions.   The patient was advised to call back or seek an in-person evaluation if the symptoms worsen or if the condition fails to improve as anticipated.   Dorothyann Peng, NP

## 2021-06-15 ENCOUNTER — Ambulatory Visit
Admission: EM | Admit: 2021-06-15 | Discharge: 2021-06-15 | Disposition: A | Discharge status: Home / Self Care | Payer: No Typology Code available for payment source | Attending: Internal Medicine | Admitting: Internal Medicine

## 2021-06-15 ENCOUNTER — Other Ambulatory Visit: Payer: Self-pay

## 2021-06-15 DIAGNOSIS — B029 Zoster without complications: Secondary | ICD-10-CM

## 2021-06-15 MED ORDER — VALACYCLOVIR HCL 1 G PO TABS: 1000.0000 mg | ORAL_TABLET | Freq: Three times a day (TID) | ORAL | 0 refills | Status: AC

## 2021-06-15 NOTE — Discharge Instructions (Addendum)
You have shingles which is being treated with an antiviral medication called valacyclovir.  Please follow-up with primary care physician for further evaluation and management.

## 2021-06-15 NOTE — ED Triage Notes (Signed)
Pt states about a week ago he noticed lesions to his back, chest, and side. States it "feels different" today and wants to make sure it is not shingles. Lesions appear red with scant serous discharge on right lower thoracic back.

## 2021-06-15 NOTE — ED Provider Notes (Signed)
[facility]    CSN: [id number] Arrival date & time: 06/15/21  1624      History   Chief Complaint Chief Complaint  Patient presents with   Rash    HPI Sandro Burgo is a 65 y.o. male.   Patient presents with rash to back that has been present for approximately 1 week.  Rash is not itchy but is irritating.  Patient has been using Benadryl cream over-the-counter with no relief in rash.  Patient denies any known fevers or purulent drainage from rash.  Denies any insects or spiders biting him.  Denies any changes to soaps, lotions, detergents, foods, etc.   Rash  Past Medical History:  Diagnosis Date   Complication of anesthesia    "didn't numb well with local anesthesia with cervical spine surgery"   Diabetes mellitus without complication ([place])    oral meds use ; TYPE 2    Hepatitis    per patient report, his PCP states a small portion of his liver may have been damaged during partial nephrectomy  but liver levels are ok and no implications to his health    Herniated lumbar intervertebral disc    not a problem presently   Hypertension    Renal mass, right    surgery planned 09-07-16    Patient Active Problem List   Diagnosis Date Noted   Portal vein thrombosis 04/04/2017   History of kidney cancer 10/15/2016   BMI 34.0-34.9,adult 08/25/2016   Type 2 diabetes mellitus without complications ([place]) 34/19/3790   Essential hypertension 09/22/2012   Varicose vein of leg 09/22/2012    Past Surgical History:  Procedure Laterality Date   CERVICAL SPINE SURGERY  10/2011   KNEE SURGERY  2017   right knee/ torn meniscus in back   left knee torn meniscus Left 03/2011   MASS EXCISION N/A 09/11/2017   Procedure: MINOR EXCISION OF BACK MASS, EXCISION OF GLUTEAL MASS;  Surgeon: Ralene Ok, MD;  Location: [facility];  Service: General;  Laterality: N/A;   PARTIAL NEPHRECTOMY Right 09/07/2016   ROBOTIC ASSITED PARTIAL NEPHRECTOMY Right 09/07/2016   Procedure:  XI ROBOTIC ASSITED LAPAROSCOPIC RIGHT PARTIAL NEPHRECTOMY;  Surgeon: Ardis Hughs, MD;  Location: [facility];  Service: Urology;  Laterality: Right;   TONSILLECTOMY     as a child   VARICOSE VEIN SURGERY     bilateral       Home Medications    Prior to Admission medications   Medication Sig Start Date End Date Taking? Authorizing Provider  valACYclovir (VALTREX) 1000 MG tablet Take 1 tablet (1,000 mg total) by mouth 3 (three) times daily for 7 days. 06/15/21 06/22/21 Yes Odis Luster, FNP  aspirin EC 81 MG tablet Take 81 mg by mouth daily.    [provider]  blood glucose meter kit and supplies KIT Dispense based on patient and insurance preference. Use up to three times daily as directed. 12/16/20   Nafziger, Tommi Rumps, NP  Coenzyme Q10 (COQ10 PO) Take 1 tablet by mouth daily.    [provider]  Ferrous Sulfate (IRON PO) Take 1 tablet by mouth daily.    [provider]  hydrochlorothiazide (MICROZIDE) 12.5 MG capsule TAKE 1 CAPSULE(12.5 MG) BY MOUTH DAILY Patient taking differently: Take 12.5 mg by mouth daily. 07/02/20   Nafziger, Tommi Rumps, NP  hydrOXYzine (ATARAX/VISTARIL) 50 MG tablet Take 1 tablet (50 mg total) by mouth 3 (three) times daily as needed. 04/28/21   Dorothyann Peng, NP  metFORMIN (GLUCOPHAGE)  500 MG tablet TAKE 1 TABLET(500 MG) BY MOUTH TWICE DAILY WITH A MEAL 03/13/21   Nafziger, Tommi Rumps, NP  Multiple Vitamin (MULTIVITAMIN WITH MINERALS) TABS Take 1 tablet by mouth daily.    [provider]  oxymetazoline (AFRIN) 0.05 % nasal spray Place 1 spray into both nostrils 2 (two) times daily as needed (nose bleeds).    [provider]  predniSONE (DELTASONE) 20 MG tablet Take 1 tablet (20 mg total) by mouth daily with breakfast. 04/28/21   Dorothyann Peng, NP  saw palmetto 500 MG capsule Take 500 mg by mouth 2 (two) times daily.    [provider]  sodium chloride (OCEAN) 0.65 % SOLN nasal spray Place 1 spray into both nostrils as needed  for congestion.    [provider]  triamcinolone (NASACORT) 55 MCG/ACT AERO nasal inhaler Place 2 sprays into the nose daily. 2 sprays each nostril at night 03/07/21 06/05/21  Rozetta Nunnery, MD  TURMERIC PO Take 1 tablet by mouth daily.    [provider]    Family History Family History  Problem Relation Age of Onset   Stroke Father        age 73   Cancer Brother        Hodgkin   HIV/AIDS Brother        age 82    Colon cancer Neg Hx    Esophageal cancer Neg Hx    Rectal cancer Neg Hx    Stomach cancer Neg Hx     Social History Social History   Tobacco Use   Smoking status: Never   Smokeless tobacco: Never   Tobacco comments:    very rare use social  Substance Use Topics   Alcohol use: Yes    Comment: rare   Drug use: No     Allergies   Other and Penicillins   Review of Systems Review of Systems Per HPI  Physical Exam Triage Vital Signs ED Triage Vitals  Enc Vitals Group     BP --      Pulse Rate 06/15/21 1632 80     Resp 06/15/21 1632 16     Temp 06/15/21 1632 98.1 F (36.7 C)     Temp Source 06/15/21 1632 Oral     SpO2 06/15/21 1632 97 %     Weight --      Height --      Head Circumference --      Peak Flow --      Pain Score 06/15/21 1640 0     Pain Loc --      Pain Edu? --      Excl. in [place]? --    No data found.  Updated Vital Signs Pulse 80   Temp 98.1 F (36.7 C) (Oral)   Resp 16   SpO2 97%   Visual Acuity Right Eye Distance:   Left Eye Distance:   Bilateral Distance:    Right Eye Near:   Left Eye Near:    Bilateral Near:     Physical Exam Constitutional:      Appearance: Normal appearance.  HENT:     Head: Normocephalic and atraumatic.  Eyes:     Extraocular Movements: Extraocular movements intact.     Conjunctiva/sclera: Conjunctivae normal.  Pulmonary:     Effort: Pulmonary effort is normal.  Skin:    General: Skin is warm and dry.     Findings: Rash present. Rash is vesicular.     Comments:  Grouped vesicular rash present to right lower back that seems to follow a dermatome.  No drainage noted.  Lesions are not open.  Neurological:     General: No focal deficit present.     Mental Status: He is alert and oriented to person, place, and time. Mental status is at baseline.  Psychiatric:        Mood and Affect: Mood normal.        Behavior: Behavior normal.        Thought Content: Thought content normal.        Judgment: Judgment normal.     UC Treatments / Results  Labs (all labs ordered are listed, but only abnormal results are displayed) Labs Reviewed - No data to display  EKG   Radiology No results found.  Procedures Procedures (including critical care time)  Medications Ordered in UC Medications - No data to display  Initial Impression / Assessment and Plan / UC Course  I have reviewed the triage vital signs and the nursing notes.  Pertinent labs & imaging results that were available during my care of the patient were reviewed by me and considered in my medical decision making (see chart for details).     Rash is most consistent with herpes zoster virus.  Will treat with valacyclovir x7 days.  No immediate complications noted on physical exam.  Patient advised to follow-up with PCP for further evaluation and management.Discussed strict return precautions. Patient verbalized understanding and is agreeable with plan.  Final Clinical Impressions(s) / UC Diagnoses   Final diagnoses:  Herpes zoster without complication     Discharge Instructions      You have shingles which is being treated with an antiviral medication called valacyclovir.  Please follow-up with primary care physician for further evaluation and management.     ED Prescriptions     Medication Sig Dispense Auth. Provider   valACYclovir (VALTREX) 1000 MG tablet Take 1 tablet (1,000 mg total) by mouth 3 (three) times daily for 7 days. 21 tablet Odis Luster, FNP      PDMP not  reviewed this encounter.   Odis Luster, [place] 06/15/21 [id number]

## 2021-06-26 ENCOUNTER — Other Ambulatory Visit: Payer: Self-pay | Admitting: Adult Health

## 2021-06-26 DIAGNOSIS — I10 Essential (primary) hypertension: Secondary | ICD-10-CM

## 2021-07-21 ENCOUNTER — Ambulatory Visit (INDEPENDENT_AMBULATORY_CARE_PROVIDER_SITE_OTHER): Payer: No Typology Code available for payment source

## 2021-07-21 ENCOUNTER — Other Ambulatory Visit: Payer: Self-pay

## 2021-07-21 DIAGNOSIS — Z23 Encounter for immunization: Secondary | ICD-10-CM

## 2021-08-14 ENCOUNTER — Other Ambulatory Visit: Payer: Self-pay | Admitting: Adult Health

## 2021-08-14 DIAGNOSIS — E119 Type 2 diabetes mellitus without complications: Secondary | ICD-10-CM

## 2021-08-28 ENCOUNTER — Ambulatory Visit
Admission: RE | Admit: 2021-08-28 | Discharge: 2021-08-28 | Disposition: A | Discharge status: Home / Self Care | Payer: No Typology Code available for payment source | Source: Ambulatory Visit | Attending: Allergy and Immunology | Admitting: Allergy and Immunology

## 2021-08-28 ENCOUNTER — Other Ambulatory Visit: Payer: Self-pay | Admitting: Allergy and Immunology

## 2021-08-28 ENCOUNTER — Other Ambulatory Visit: Payer: Self-pay

## 2021-08-28 DIAGNOSIS — R059 Cough, unspecified: Secondary | ICD-10-CM

## 2021-09-22 ENCOUNTER — Encounter: Payer: Self-pay | Admitting: Adult Health

## 2021-09-22 ENCOUNTER — Ambulatory Visit (INDEPENDENT_AMBULATORY_CARE_PROVIDER_SITE_OTHER): Payer: No Typology Code available for payment source | Admitting: Adult Health

## 2021-09-22 VITALS — BP 126/76 | HR 70 | Temp 98.6°F | Ht 70.75 in | Wt 247.0 lb

## 2021-09-22 DIAGNOSIS — I10 Essential (primary) hypertension: Secondary | ICD-10-CM

## 2021-09-22 DIAGNOSIS — E119 Type 2 diabetes mellitus without complications: Secondary | ICD-10-CM

## 2021-09-22 DIAGNOSIS — Z125 Encounter for screening for malignant neoplasm of prostate: Secondary | ICD-10-CM | POA: Diagnosis not present

## 2021-09-22 DIAGNOSIS — Z23 Encounter for immunization: Secondary | ICD-10-CM

## 2021-09-22 DIAGNOSIS — K429 Umbilical hernia without obstruction or gangrene: Secondary | ICD-10-CM

## 2021-09-22 DIAGNOSIS — Z0000 Encounter for general adult medical examination without abnormal findings: Secondary | ICD-10-CM

## 2021-09-22 LAB — MICROALBUMIN / CREATININE URINE RATIO
Creatinine,U: 165.4 mg/dL
Microalb Creat Ratio: 2.2 mg/g (ref 0.0–30.0)
Microalb, Ur: 3.6 mg/dL — ABNORMAL HIGH (ref 0.0–1.9)

## 2021-09-22 LAB — CBC WITH DIFFERENTIAL/PLATELET
Basophils Absolute: 0 10*3/uL (ref 0.0–0.1)
Basophils Relative: 0.5 % (ref 0.0–3.0)
Eosinophils Absolute: 0 10*3/uL (ref 0.0–0.7)
Eosinophils Relative: 1.4 % (ref 0.0–5.0)
HCT: 48.8 % (ref 39.0–52.0)
Hemoglobin: 16 g/dL (ref 13.0–17.0)
Lymphocytes Relative: 45.6 % (ref 12.0–46.0)
Lymphs Abs: 1.4 10*3/uL (ref 0.7–4.0)
MCHC: 32.7 g/dL (ref 30.0–36.0)
MCV: 87.9 fl (ref 78.0–100.0)
Monocytes Absolute: 0.3 10*3/uL (ref 0.1–1.0)
Monocytes Relative: 8.8 % (ref 3.0–12.0)
Neutro Abs: 1.3 10*3/uL — ABNORMAL LOW (ref 1.4–7.7)
Neutrophils Relative %: 43.7 % (ref 43.0–77.0)
Platelets: 248 10*3/uL (ref 150.0–400.0)
RBC: 5.55 Mil/uL (ref 4.22–5.81)
RDW: 13.1 % (ref 11.5–15.5)
WBC: 3 10*3/uL — ABNORMAL LOW (ref 4.0–10.5)

## 2021-09-22 LAB — LIPID PANEL
Cholesterol: 200 mg/dL (ref 0–200)
HDL: 64.1 mg/dL (ref >39.00)
LDL Cholesterol: 118 mg/dL — ABNORMAL HIGH (ref 0–99)
NonHDL: 136.14
Total CHOL/HDL Ratio: 3
Triglycerides: 93 mg/dL (ref 0.0–149.0)
VLDL: 18.6 mg/dL (ref 0.0–40.0)

## 2021-09-22 LAB — COMPREHENSIVE METABOLIC PANEL
ALT: 23 U/L (ref 0–53)
AST: 21 U/L (ref 0–37)
Albumin: 4.6 g/dL (ref 3.5–5.2)
Alkaline Phosphatase: 41 U/L (ref 39–117)
BUN: 17 mg/dL (ref 6–23)
CO2: 27 mEq/L (ref 19–32)
Calcium: 9.7 mg/dL (ref 8.4–10.5)
Chloride: 99 mEq/L (ref 96–112)
Creatinine, Ser: 1.09 mg/dL (ref 0.40–1.50)
GFR: 72.19 mL/min (ref >60.00)
Glucose, Bld: 113 mg/dL — ABNORMAL HIGH (ref 70–99)
Potassium: 4.3 mEq/L (ref 3.5–5.1)
Sodium: 137 mEq/L (ref 135–145)
Total Bilirubin: 0.7 mg/dL (ref 0.2–1.2)
Total Protein: 7.4 g/dL (ref 6.0–8.3)

## 2021-09-22 LAB — PSA: PSA: 2.04 ng/mL (ref 0.10–4.00)

## 2021-09-22 LAB — TSH: TSH: 1.84 u[IU]/mL (ref 0.35–5.50)

## 2021-09-22 LAB — HEMOGLOBIN A1C: Hgb A1c MFr Bld: 6.8 % — ABNORMAL HIGH (ref 4.6–6.5)

## 2021-09-22 NOTE — Patient Instructions (Signed)
It was great seeing you today   We will follow up with you regarding your lab work   I will see you back in 6 months  Please let me know if you need anything

## 2021-09-22 NOTE — Progress Notes (Signed)
Subjective:    Patient ID: Michael Church, male    DOB: October 07, 1955, 66 y.o.   MRN: [id number]  HPI Patient presents for yearly preventative medicine examination. He is a 66 year old male who  has a past medical history of Complication of anesthesia, Diabetes mellitus without complication ([place]), Hepatitis, Herniated lumbar intervertebral disc, Hypertension, and Renal mass, right.  Dm type 2 -managed with metformin 500 mg twice daily.  He does check his blood sugars at home periodically with readings in the 120s to 130s.  He denies symptoms of hypo or hyperglycemia.  He has been trying to work on lifestyle modifications with cutting back on portion sizes and exercising more often. Lab Results  Component Value Date   HGBA1C 6.4 (A) 03/17/2021   HTN -managed with HCTZ 12.5 mg daily.  He denies dizziness, lightheadedness, chest pain, shortness of breath, or syncopal episodes BP Readings from Last 3 Encounters:  09/22/21 120/70  03/17/21 110/80  35/00/93 818/29    Umbilical Hernia -has been present for multiple years.  Reports intermittent pain at site of hernia from time to time.  He is interested in seeing a Education officer, environmental for treatment.  All immunizations and health maintenance protocols were reviewed with the patient and needed orders were placed.  Appropriate screening laboratory values were ordered for the patient including screening of hyperlipidemia, renal function and hepatic function. If indicated by BPH, a PSA was ordered.  Medication reconciliation,  past medical history, social history, problem list and allergies were reviewed in detail with the patient  Goals were established with regard to weight loss, exercise, and  diet in compliance with medications  Wt Readings from Last 3 Encounters:  09/22/21 247 lb (112 kg)  04/28/21 249 lb (112.9 kg)  03/17/21 249 lb (112.9 kg)   He is up-to-date on routine colon cancer screening   Review of Systems  Constitutional:  Negative.   HENT: Negative.    Eyes: Negative.   Respiratory: Negative.    Cardiovascular: Negative.   Gastrointestinal:  Positive for abdominal pain.  Endocrine: Negative.   Genitourinary: Negative.   Musculoskeletal: Negative.   Skin: Negative.   Allergic/Immunologic: Negative.   Neurological: Negative.   Hematological: Negative.   Psychiatric/Behavioral: Negative.    All other systems reviewed and are negative. Past Medical History:  Diagnosis Date   Complication of anesthesia    "didn't numb well with local anesthesia with cervical spine surgery"   Diabetes mellitus without complication ([place])    oral meds use ; TYPE 2    Hepatitis    per patient report, his PCP states a small portion of his liver may have been damaged during partial nephrectomy  but liver levels are ok and no implications to his health    Herniated lumbar intervertebral disc    not a problem presently   Hypertension    Renal mass, right    surgery planned 09-07-16    Social History   Socioeconomic History   Marital status: Married    Spouse name: Seth Bake   Number of children: 2   Years of education: Not on file   Highest education level: Not on file  Occupational History   Occupation: Code and compliance    Employer: [employer]  Tobacco Use   Smoking status: Never   Smokeless tobacco: Never   Tobacco comments:    very rare use social  Substance and Sexual Activity   Alcohol use: Yes    Comment: rare  Drug use: No   Sexual activity: Yes  Other Topics Concern   Not on file  Social History Narrative   Retired from post office    Works part time for the [employer] Strain: Not on [employer] Insecurity: Not on file  Transportation Needs: Not on file  Physical Activity: Not on file  Stress: Not on file  Social Connections: Not on file  Intimate Partner Violence: Not on file    Past Surgical History:  Procedure  Laterality Date   CERVICAL SPINE SURGERY  10/2011   KNEE SURGERY  2017   right knee/ torn meniscus in back   left knee torn meniscus Left 03/2011   MASS EXCISION N/A 09/11/2017   Procedure: [place];  Surgeon: Ralene Ok, MD;  Location: [facility];  Service: General;  Laterality: N/A;   PARTIAL NEPHRECTOMY Right 09/07/2016   ROBOTIC ASSITED PARTIAL NEPHRECTOMY Right 09/07/2016   Procedure: XI ROBOTIC ASSITED LAPAROSCOPIC RIGHT PARTIAL NEPHRECTOMY;  Surgeon: Ardis Hughs, MD;  Location: [facility];  Service: Urology;  Laterality: Right;   TONSILLECTOMY     as a child   VARICOSE VEIN SURGERY     bilateral    Family History  Problem Relation Age of Onset   Stroke Father        age 23   Cancer Brother        Hodgkin   HIV/AIDS Brother        age 37    Colon cancer Neg Hx    Esophageal cancer Neg Hx    Rectal cancer Neg Hx    Stomach cancer Neg Hx     Allergies  Allergen Reactions   Other Itching    Foods - cantaloupes, tomatoes, bananas, and  raw carrots.   Penicillins Itching    Has patient had a PCN reaction causing immediate rash, facial/tongue/throat swelling, SOB or lightheadedness with hypotension: No Has patient had a PCN reaction causing severe rash involving mucus membranes or skin necrosis: No Has patient had a PCN reaction that required hospitalization: No Has patient had a PCN reaction occurring within the last 10 years: No If all of the above answers are "NO", then may proceed with Cephalosporin use.     Current Outpatient Medications on File Prior to Visit  Medication Sig Dispense Refill   aspirin EC 81 MG tablet Take 81 mg by mouth daily.     blood glucose meter kit and supplies KIT Dispense based on patient and insurance preference. Use up to three times daily as directed. 1 each 0   Coenzyme Q10 (COQ10 PO) Take 1 tablet by mouth daily.     EPINEPHrine 0.3 mg/0.3 mL IJ SOAJ injection SMARTSIG:Injection IM As  Directed     famotidine (PEPCID) 20 MG tablet 1 tablet     Ferrous Sulfate (IRON PO) Take 1 tablet by mouth daily.     GELSYN-3 16.8 MG/2ML SOSY      hydrochlorothiazide (MICROZIDE) 12.5 MG capsule TAKE 1 CAPSULE(12.5 MG) BY MOUTH DAILY 90 capsule 3   hydrOXYzine (ATARAX/VISTARIL) 50 MG tablet Take 1 tablet (50 mg total) by mouth 3 (three) times daily as needed. 30 tablet 0   metFORMIN (GLUCOPHAGE) 500 MG tablet TAKE 1 TABLET(500 MG) BY MOUTH TWICE DAILY WITH A MEAL 180 tablet 0   Multiple Vitamin (MULTIVITAMIN WITH MINERALS) TABS Take 1 tablet by mouth daily.  oxymetazoline (AFRIN) 0.05 % nasal spray Place 1 spray into both nostrils 2 (two) times daily as needed (nose bleeds).     saw palmetto 500 MG capsule Take 500 mg by mouth 2 (two) times daily.     sodium chloride (OCEAN) 0.65 % SOLN nasal spray Place 1 spray into both nostrils as needed for congestion.     Triamcinolone Acetonide (NASACORT AQ NA)      TURMERIC PO Take 1 tablet by mouth daily.     triamcinolone (NASACORT) 55 MCG/ACT AERO nasal inhaler Place 2 sprays into the nose daily. 2 sprays each nostril at night 1 each 12   No current facility-administered medications on file prior to visit.    BP 120/70 Comment: home   Pulse 70    Temp 98.6 F (37 C) (Oral)    Ht 5' 10.75" (1.797 m)    Wt 247 lb (112 kg)    SpO2 98%    BMI 34.69 kg/m       Objective:   Physical Exam Vitals and nursing note reviewed.  Constitutional:      General: He is not in acute distress.    Appearance: Normal appearance. He is well-developed and normal weight.  HENT:     Head: Normocephalic and atraumatic.     Right Ear: Tympanic membrane, ear canal and external ear normal. There is no impacted cerumen.     Left Ear: Tympanic membrane, ear canal and external ear normal. There is no impacted cerumen.     Nose: Nose normal. No congestion or rhinorrhea.     Mouth/Throat:     Mouth: Mucous membranes are moist.     Pharynx: Oropharynx is clear. No  oropharyngeal exudate or posterior oropharyngeal erythema.  Eyes:     General:        Right eye: No discharge.        Left eye: No discharge.     Extraocular Movements: Extraocular movements intact.     Conjunctiva/sclera: Conjunctivae normal.     Pupils: Pupils are equal, round, and reactive to light.  Neck:     Vascular: No carotid bruit.     Trachea: No tracheal deviation.  Cardiovascular:     Rate and Rhythm: Normal rate and regular rhythm.     Pulses: Normal pulses.     Heart sounds: Normal heart sounds. No murmur heard.   No friction rub. No gallop.  Pulmonary:     Effort: Pulmonary effort is normal. No respiratory distress.     Breath sounds: Normal breath sounds. No stridor. No wheezing, rhonchi or rales.  Chest:     Chest wall: No tenderness.  Abdominal:     General: Bowel sounds are normal. There is no distension.     Palpations: Abdomen is soft. There is no mass.     Tenderness: There is no abdominal tenderness. There is no right CVA tenderness, left CVA tenderness, guarding or rebound.     Hernia: A hernia is present. Hernia is present in the umbilical area.       Comments: No tenderness to hernia with palpation today. Hernia is soft and easily reduces  Musculoskeletal:        General: No swelling, tenderness, deformity or signs of injury. Normal range of motion.     Right lower leg: No edema.     Left lower leg: No edema.  Lymphadenopathy:     Cervical: No cervical adenopathy.  Skin:    General: Skin is warm and dry.  Capillary Refill: Capillary refill takes less than 2 seconds.     Coloration: Skin is not jaundiced or pale.     Findings: No bruising, erythema, lesion or rash.  Neurological:     General: No focal deficit present.     Mental Status: He is alert and oriented to person, place, and time.     Cranial Nerves: No cranial nerve deficit.     Sensory: No sensory deficit.     Motor: No weakness.     Coordination: Coordination normal.     Gait: Gait  normal.     Deep Tendon Reflexes: Reflexes normal.  Psychiatric:        Mood and Affect: Mood normal.        Behavior: Behavior normal.        Thought Content: Thought content normal.        Judgment: Judgment normal.      Assessment & Plan:  1. Routine general medical examination at a health care facility - Continue with weight loss through lifestyle modifications  - CBC with Differential/Platelet; Future - Comprehensive metabolic panel; Future - Hemoglobin A1c; Future - Lipid panel; Future - TSH; Future - Microalbumin/Creatinine Ratio, Urine; Future - Microalbumin/Creatinine Ratio, Urine - CBC with Differential/Platelet - TSH - Lipid panel - Hemoglobin A1c - Comprehensive metabolic panel  2. Essential hypertension - Controlled - CBC with Differential/Platelet; Future - Comprehensive metabolic panel; Future - Hemoglobin A1c; Future - Lipid panel; Future - TSH; Future - Microalbumin/Creatinine Ratio, Urine; Future - Microalbumin/Creatinine Ratio, Urine - CBC with Differential/Platelet - TSH - Lipid panel - Hemoglobin A1c - Comprehensive metabolic panel  3. Type 2 diabetes mellitus without complication, without long-term current use of insulin (HCC) - Controlled. Likely no increase in medications  - Follow up in 6 months  - CBC with Differential/Platelet; Future - Comprehensive metabolic panel; Future - Hemoglobin A1c; Future - Lipid panel; Future - TSH; Future - Microalbumin/Creatinine Ratio, Urine; Future - Microalbumin/Creatinine Ratio, Urine - CBC with Differential/Platelet - TSH - Lipid panel - Hemoglobin A1c - Comprehensive metabolic panel  4. Prostate cancer screening  - PSA; Future - PSA  5. Need for shingles vaccine  - Varicella-zoster vaccine IM  6. Umbilical hernia without obstruction and without gangrene  - Ambulatory referral to [facility] [place], NP

## 2021-10-27 ENCOUNTER — Other Ambulatory Visit: Payer: Self-pay | Admitting: Surgery

## 2021-11-10 ENCOUNTER — Other Ambulatory Visit: Payer: Self-pay | Admitting: Adult Health

## 2021-11-10 DIAGNOSIS — E119 Type 2 diabetes mellitus without complications: Secondary | ICD-10-CM

## 2021-11-24 ENCOUNTER — Telehealth (INDEPENDENT_AMBULATORY_CARE_PROVIDER_SITE_OTHER): Payer: No Typology Code available for payment source | Admitting: Adult Health

## 2021-11-24 ENCOUNTER — Encounter: Payer: Self-pay | Admitting: Adult Health

## 2021-11-24 ENCOUNTER — Encounter (HOSPITAL_BASED_OUTPATIENT_CLINIC_OR_DEPARTMENT_OTHER): Payer: Self-pay | Admitting: Surgery

## 2021-11-24 VITALS — Ht 70.5 in | Wt 247.0 lb

## 2021-11-24 DIAGNOSIS — J302 Other seasonal allergic rhinitis: Secondary | ICD-10-CM | POA: Diagnosis not present

## 2021-11-24 NOTE — Telephone Encounter (Signed)
Called pt to schedule a visit.  ?

## 2021-11-24 NOTE — Progress Notes (Signed)
? ?Subjective:  ? ? Patient ID: Michael Church, male    DOB: 1956-09-01, 66 y.o.   MRN: [id number] ? ?HPI ? ?Virtual Visit via Video Note ? ?I connected with Michael Church on 11/24/21 at 10:30 AM EST by a video enabled telemedicine application and verified that I am speaking with the correct person using two identifiers. ? Location patient: home ?Location provider:work or home office ?Persons participating in the virtual visit: patient, provider ? ?I discussed the limitations of evaluation and management by telemedicine and the availability of in person appointments. The patient expressed understanding and agreed to proceed. ? ? ?He is being evaluated today for concern of sinusitis.  He has upcoming hernia surgery next week and wants to make sure he does not have a sinus infection. ? ?He reports over the last week he has been working outside, yesterday he developed a mild pressure above his eyes that later progressed to below his eyes and his cheek.  Feels as though the pain went all the way down to his teeth.  He does get allergy shots and uses Nasacort.  This morning he used a sinus rinse with significant improvement in his symptoms. ? ?Associated symptoms include itchy watery eyes and clear rhinorrhea.  He has not had any fevers or chills ? ? ? ?ROS: See pertinent positives and negatives per HPI. ? ?Past Medical History:  ?Diagnosis Date  ? Complication of anesthesia   ? "didn't numb well with local anesthesia with cervical spine surgery"  ? Diabetes mellitus without complication ([facility])   ? oral meds use ; TYPE 2   ? Hepatitis   ? per patient report, his PCP states a small portion of his liver may have been damaged during partial nephrectomy  but liver levels are ok and no implications to his health   ? Herniated lumbar intervertebral disc   ? not a problem presently  ? Hypertension   ? Renal mass, right   ? surgery planned 09-07-16  ? ? ?Past Surgical History:  ?Procedure Laterality Date  ? CERVICAL SPINE  SURGERY  10/2011  ? KNEE SURGERY  2017  ? right knee/ torn meniscus in back  ? left knee torn meniscus Left 03/2011  ? MASS EXCISION N/A 09/11/2017  ? Procedure: MINOR EXCISION OF BACK MASS, EXCISION OF GLUTEAL MASS;  Surgeon: Ralene Ok, MD;  Location: [facility];  Service: General;  Laterality: N/A;  ? PARTIAL NEPHRECTOMY Right 09/07/2016  ? ROBOTIC ASSITED PARTIAL NEPHRECTOMY Right 09/07/2016  ? Procedure: XI ROBOTIC ASSITED LAPAROSCOPIC RIGHT PARTIAL NEPHRECTOMY;  Surgeon: Ardis Hughs, MD;  Location: [facility];  Service: Urology;  Laterality: Right;  ? TONSILLECTOMY    ? as a child  ? VARICOSE VEIN SURGERY    ? bilateral  ? ? ?Family History  ?Problem Relation Age of Onset  ? Stroke Father   ?     age 87  ? Cancer Brother   ?     Hodgkin  ? HIV/AIDS Brother   ?     age 83   ? Colon cancer Neg Hx   ? Esophageal cancer Neg Hx   ? Rectal cancer Neg Hx   ? Stomach cancer Neg Hx   ? ? ? ? ? ?Current Outpatient Medications:  ?  aspirin EC 81 MG tablet, Take 81 mg by mouth daily., Disp: , Rfl:  ?  blood glucose meter kit and supplies KIT, Dispense based on patient and insurance preference. Use up to three times  daily as directed., Disp: 1 each, Rfl: 0 ?  Coenzyme Q10 (COQ10 PO), Take 1 tablet by mouth daily., Disp: , Rfl:  ?  EPINEPHrine 0.3 mg/0.3 mL IJ SOAJ injection, SMARTSIG:Injection IM As Directed, Disp: , Rfl:  ?  famotidine (PEPCID) 20 MG tablet, 1 tablet, Disp: , Rfl:  ?  Ferrous Sulfate (IRON PO), Take 1 tablet by mouth daily., Disp: , Rfl:  ?  GELSYN-3 16.8 MG/2ML SOSY, , Disp: , Rfl:  ?  hydrochlorothiazide (MICROZIDE) 12.5 MG capsule, TAKE 1 CAPSULE(12.5 MG) BY MOUTH DAILY, Disp: 90 capsule, Rfl: 3 ?  metFORMIN (GLUCOPHAGE) 500 MG tablet, TAKE 1 TABLET(500 MG) BY MOUTH TWICE DAILY WITH A MEAL, Disp: 180 tablet, Rfl: 0 ?  Multiple Vitamin (MULTIVITAMIN WITH MINERALS) TABS, Take 1 tablet by mouth daily., Disp: , Rfl:  ?  oxymetazoline (AFRIN) 0.05 % nasal spray, Place 1 spray into both nostrils 2 (two)  times daily as needed (nose bleeds)., Disp: , Rfl:  ?  saw palmetto 500 MG capsule, Take 500 mg by mouth 2 (two) times daily., Disp: , Rfl:  ?  sodium chloride (OCEAN) 0.65 % SOLN nasal spray, Place 1 spray into both nostrils as needed for congestion., Disp: , Rfl:  ?  Triamcinolone Acetonide (NASACORT AQ NA), , Disp: , Rfl:  ?  TURMERIC PO, Take 1 tablet by mouth daily., Disp: , Rfl:  ?  triamcinolone (NASACORT) 55 MCG/ACT AERO nasal inhaler, Place 2 sprays into the nose daily. 2 sprays each nostril at night, Disp: 1 each, Rfl: 12 ? ?EXAM: ? ?VITALS per patient if applicable: ? ?GENERAL: alert, oriented, appears well and in no acute distress ? ?HEENT: atraumatic, conjunttiva clear, no obvious abnormalities on inspection of external nose and ears ? ?NECK: normal movements of the head and neck ? ?LUNGS: on inspection no signs of respiratory distress, breathing rate appears normal, no obvious gross SOB, gasping or wheezing ? ?CV: no obvious cyanosis ? ?MS: moves all visible extremities without noticeable abnormality ? ?PSYCH/NEURO: pleasant and cooperative, no obvious depression or anxiety, speech and thought processing grossly intact ? ?ASSESSMENT AND PLAN: ? ?Discussed the following assessment and plan: ? ?1. Seasonal allergies ?-Symptoms consistent with seasonal allergies.  Advised to continue with conservative measures such as Nasacort and sinus rinses.  If symptoms change then follow-up. ? ? ?  ?I discussed the assessment and treatment plan with the patient. The patient was provided an opportunity to ask questions and all were answered. The patient agreed with the plan and demonstrated an understanding of the instructions. ?  ?The patient was advised to call back or seek an in-person evaluation if the symptoms worsen or if the condition fails to improve as anticipated. ? ? ? ?Dorothyann Peng, NP ? ? ?

## 2021-11-27 ENCOUNTER — Other Ambulatory Visit: Payer: Self-pay

## 2021-11-27 ENCOUNTER — Encounter (HOSPITAL_BASED_OUTPATIENT_CLINIC_OR_DEPARTMENT_OTHER): Payer: Self-pay | Admitting: Surgery

## 2021-11-27 NOTE — Progress Notes (Signed)
Spoke w/ via phone for pre-op interview--- pt ?Lab needs dos----  [facility] and ekg             ?Lab results------ no ?COVID test -----patient states asymptomatic no test needed ?Arrive at ------- 0630 on 11-29-2021 ?NPO after MN NO Solid Food.  Clear liquids from MN until--- 0530 ?Med rec completed ?Medications to take morning of surgery ----- pepcid ?Diabetic medication ----- do not take metformin morning of surgery ?Patient instructed no nail polish to be worn day of surgery ?Patient instructed to bring photo id and insurance card day of surgery ?Patient aware to have Driver (ride ) / caregiver for 24 hours after surgery --wife, Michael Church ?Patient Special Instructions ----- n/a ?Pre-Op special Istructions ----- n/a ?Patient verbalized understanding of instructions that were given at this phone interview. ?Patient denies shortness of breath, chest pain, fever, cough at this phone interview.  ?

## 2021-11-28 NOTE — H&P (Signed)
?REFERRING PHYSICIAN: Dorothyann Peng, NP ? ?PROVIDER: Beverlee Nims, MD ? ?MRN: [id number] ?DOB: 12/15/1955 ? ? ?Subjective  ? ?Chief Complaint: New Patient and Umbilical Hernia (Umb hernia ) ? ? ?History of Present Illness: ?Michael Church is a 66 y.o. male who is seen ? as an office consultation at the request of Dr. Carlisle Church for evaluation of New Patient and Umbilical Hernia (Umb hernia ) ?.  ? ?This is a pleasant 66 year old gentleman who is referred here for evaluation of an incisional hernia. The patient has had a prior laparoscopic nephrectomy for a kidney malignancy. He is recently noticed a hernia at one of the port sites. He reports it is causing discomfort especially when he eats. He reports that the bulge will easily reduced. His pain is mild to moderate. He is otherwise without complaints. ? ?Review of Systems: ?A complete review of systems was obtained from the patient. I have reviewed this information and discussed as appropriate with the patient. See HPI as well for other ROS. ? ?ROS  ? ?Medical History: ?Past Medical History:  ?Diagnosis Date  ? Diabetes mellitus without complication (CMS-HCC)  ? History of cancer  ? ?There is no problem list on file for this patient. ? ?Past Surgical History:  ?Procedure Laterality Date  ? CYSTECTOMY  ? ? ?Allergies  ?Allergen Reactions  ? Penicillins Itching and Unknown  ?Has patient had a PCN reaction causing immediate rash, facial/tongue/throat swelling, SOB or lightheadedness with hypotension: No ?Has patient had a PCN reaction causing severe rash involving mucus membranes or skin necrosis: No ?Has patient had a PCN reaction that required hospitalization: No ?Has patient had a PCN reaction occurring within the last 10 years: No ?If all of the above answers are "NO", then may proceed with Cephalosporin use. ? ? ?Current Outpatient Medications on File Prior to Visit  ?Medication Sig Dispense Refill  ? EPINEPHrine (EPIPEN) 0.3 mg/0.3 mL auto-injector  ?  hydroCHLOROthiazide (MICROZIDE) 12.5 mg capsule  ? metFORMIN (GLUCOPHAGE) 500 MG tablet metformin 500 mg tablet  ? saw palmetto 500 MG capsule Take 500 mg by mouth 2 (two) times daily  ? ?No current facility-administered medications on file prior to visit.  ? ?Family History  ?Problem Relation Age of Onset  ? Skin cancer Father  ? High blood pressure (Hypertension) Father  ? ? ?Social History  ? ?Tobacco Use  ?Smoking Status Never  ?Smokeless Tobacco Never  ? ? ?Social History  ? ?Socioeconomic History  ? Marital status: Married  ?Tobacco Use  ? Smoking status: Never  ? Smokeless tobacco: Never  ?Substance and Sexual Activity  ? Alcohol use: Yes  ?Comment: occas 3 weeks  ? Drug use: Never  ? ?Objective:  ? ?Vitals:  ?10/27/21 0912  ?BP: 132/80  ?Pulse: 84  ?Temp: 36.7 ?C (98 ?F)  ?SpO2: 99%  ?Weight: (!) 112.9 kg (249 lb)  ?Height: 180.3 cm ('5\' 11"'$ )  ? ?Body mass index is 34.73 kg/m?. ? ?Physical Exam  ? ?He appears well on exam ? ?His abdomen is soft and nontender. At his midline port site above the umbilicus, there is a 3 cm fascial defect with an easily reducible incisional hernia. It is nontender today ? ?Labs, Imaging and Diagnostic Testing: ?I have reviewed his most recent laboratory data ? ?Assessment and Plan:  ? ?Diagnoses and all orders for this visit: ? ?Incisional hernia, without obstruction or gangrene ? ? ? ?At this point had a long discussion with the patient regarding abdominal wall anatomy and hernias.  This is an easily reducible small incisional hernia with a 3 cm fascial defect. As he is symptomatic, repair with mesh is recommended. I discussed both the laparoscopic and open techniques. He wishes to proceed with a small open umbilical hernia pair with mesh as an outpatient. I again explained the surgical procedure in detail. I discussed the risk which includes but is not limited to bleeding, infection, injury to surrounding structures, the use of mesh, hernia recurrence, cardiopulmonary issues,  postoperative recovery, etc. Surgery will be scheduled.  ?

## 2021-11-28 NOTE — Anesthesia Preprocedure Evaluation (Addendum)
Anesthesia Evaluation  ?Patient identified by MRN, date of birth, ID band ?Patient awake ? ? ? ?Reviewed: ?Allergy & Precautions, NPO status , Patient's Chart, lab work & pertinent test results ? ?History of Anesthesia Complications ?Negative for: history of anesthetic complications ? ?Airway ?Mallampati: II ? ?TM Distance: >3 FB ?Neck ROM: Full ? ? ? Dental ? ?(+) Dental Advisory Given, Teeth Intact ?  ?Pulmonary ?Current Smoker and Patient abstained from smoking.,  ?  ?Pulmonary exam normal ?breath sounds clear to auscultation ? ? ? ? ? ? Cardiovascular ?hypertension, Pt. on medications ?(-) angina+ Peripheral Vascular Disease  ?(-) Past MI Normal cardiovascular exam ?Rhythm:Regular Rate:Normal ? ?Neg stress 2005, denies CP or SOB with exertion  ?  ?Neuro/Psych ?negative neurological ROS ?   ? GI/Hepatic ?GERD  ,(+) Hepatitis -  ?Endo/Other  ?diabetes, Type 2, Oral Hypoglycemic AgentsMorbid obesity ? Renal/GU ?Renal diseaseRight renal mass  ? ?  ?Musculoskeletal ? ?(+) Arthritis ,  ? Abdominal ?(+) + obese,   ?Peds ? Hematology ?  ?Anesthesia Other Findings ? ? Reproductive/Obstetrics ? ?  ? ? ? ? ? ? ? ? ? ? ? ? ? ?  ?  ? ? ? ? ? ? ? ?Anesthesia Physical ? ?Anesthesia Plan ? ?ASA: 3 ? ?Anesthesia Plan: General  ? ?Post-op Pain Management: Tylenol PO (pre-op)*  ? ?Induction: Intravenous ? ?PONV Risk Score and Plan: 4 or greater and Ondansetron, Dexamethasone, Treatment may vary due to age or medical condition and Midazolam ? ?Airway Management Planned: LMA ? ?Additional Equipment: None ? ?Intra-op Plan:  ? ?Post-operative Plan: Extubation in OR ? ?Informed Consent: I have reviewed the patients History and Physical, chart, labs and discussed the procedure including the risks, benefits and alternatives for the proposed anesthesia with the patient or authorized representative who has indicated his/her understanding and acceptance.  ? ? ? ?Dental advisory given ? ?Plan Discussed with:  CRNA ? ?Anesthesia Plan Comments:   ? ? ? ? ? ?Anesthesia Quick Evaluation ? ?

## 2021-11-29 ENCOUNTER — Encounter (HOSPITAL_BASED_OUTPATIENT_CLINIC_OR_DEPARTMENT_OTHER): Payer: Self-pay | Admitting: Surgery

## 2021-11-29 ENCOUNTER — Ambulatory Visit (HOSPITAL_BASED_OUTPATIENT_CLINIC_OR_DEPARTMENT_OTHER): Payer: No Typology Code available for payment source | Admitting: Anesthesiology

## 2021-11-29 ENCOUNTER — Encounter (HOSPITAL_BASED_OUTPATIENT_CLINIC_OR_DEPARTMENT_OTHER)
Admission: RE | Disposition: A | Discharge status: Home / Self Care | Payer: Self-pay | Source: Home / Self Care | Attending: Surgery

## 2021-11-29 ENCOUNTER — Other Ambulatory Visit: Payer: Self-pay

## 2021-11-29 ENCOUNTER — Ambulatory Visit (HOSPITAL_BASED_OUTPATIENT_CLINIC_OR_DEPARTMENT_OTHER)
Admission: RE | Admit: 2021-11-29 | Discharge: 2021-11-29 | Disposition: A | Discharge status: Home / Self Care | Payer: No Typology Code available for payment source | Attending: Surgery | Admitting: Surgery

## 2021-11-29 DIAGNOSIS — F17210 Nicotine dependence, cigarettes, uncomplicated: Secondary | ICD-10-CM | POA: Diagnosis not present

## 2021-11-29 DIAGNOSIS — I10 Essential (primary) hypertension: Secondary | ICD-10-CM

## 2021-11-29 DIAGNOSIS — Z7984 Long term (current) use of oral hypoglycemic drugs: Secondary | ICD-10-CM | POA: Insufficient documentation

## 2021-11-29 DIAGNOSIS — E119 Type 2 diabetes mellitus without complications: Secondary | ICD-10-CM | POA: Insufficient documentation

## 2021-11-29 DIAGNOSIS — Z85528 Personal history of other malignant neoplasm of kidney: Secondary | ICD-10-CM | POA: Insufficient documentation

## 2021-11-29 DIAGNOSIS — K432 Incisional hernia without obstruction or gangrene: Secondary | ICD-10-CM | POA: Diagnosis not present

## 2021-11-29 DIAGNOSIS — Z905 Acquired absence of kidney: Secondary | ICD-10-CM | POA: Diagnosis not present

## 2021-11-29 DIAGNOSIS — E1151 Type 2 diabetes mellitus with diabetic peripheral angiopathy without gangrene: Secondary | ICD-10-CM | POA: Diagnosis not present

## 2021-11-29 DIAGNOSIS — Z6834 Body mass index (BMI) 34.0-34.9, adult: Secondary | ICD-10-CM | POA: Insufficient documentation

## 2021-11-29 DIAGNOSIS — K429 Umbilical hernia without obstruction or gangrene: Secondary | ICD-10-CM | POA: Insufficient documentation

## 2021-11-29 HISTORY — DX: Incisional hernia without obstruction or gangrene: K43.2

## 2021-11-29 HISTORY — DX: Type 2 diabetes mellitus without complications: E11.9

## 2021-11-29 HISTORY — DX: Nocturia: R35.1

## 2021-11-29 HISTORY — DX: Unspecified osteoarthritis, unspecified site: M19.90

## 2021-11-29 HISTORY — DX: Other seasonal allergic rhinitis: J30.2

## 2021-11-29 HISTORY — DX: Personal history of urinary calculi: Z87.442

## 2021-11-29 HISTORY — DX: Gastro-esophageal reflux disease without esophagitis: K21.9

## 2021-11-29 HISTORY — DX: Chronic rhinitis: J31.0

## 2021-11-29 HISTORY — DX: Asymptomatic varicose veins of unspecified lower extremity: I83.90

## 2021-11-29 HISTORY — DX: Personal history of other specified conditions: Z87.898

## 2021-11-29 LAB — POCT I-STAT, CHEM 8
BUN: 14 mg/dL (ref 8–23)
Calcium, Ion: 1.31 mmol/L (ref 1.15–1.40)
Chloride: 101 mmol/L (ref 98–111)
Creatinine, Ser: 1 mg/dL (ref 0.61–1.24)
Glucose, Bld: 132 mg/dL — ABNORMAL HIGH (ref 70–99)
HCT: 47 % (ref 39.0–52.0)
Hemoglobin: 16 g/dL (ref 13.0–17.0)
Potassium: 3.8 mmol/L (ref 3.5–5.1)
Sodium: 139 mmol/L (ref 135–145)
TCO2: 27 mmol/L (ref 22–32)

## 2021-11-29 LAB — GLUCOSE, CAPILLARY: Glucose-Capillary: 127 mg/dL — ABNORMAL HIGH (ref 70–99)

## 2021-11-29 SURGERY — REPAIR, HERNIA, INCISIONAL
Anesthesia: General | Site: Abdomen

## 2021-11-29 MED ORDER — PROPOFOL 10 MG/ML IV BOLUS
INTRAVENOUS | Status: AC
  Filled 2021-11-29: qty 20

## 2021-11-29 MED ORDER — DEXAMETHASONE SODIUM PHOSPHATE 10 MG/ML IJ SOLN
INTRAMUSCULAR | Status: AC
  Filled 2021-11-29: qty 1

## 2021-11-29 MED ORDER — CHLORHEXIDINE GLUCONATE CLOTH 2 % EX PADS: 6.0000 | MEDICATED_PAD | Freq: Once | CUTANEOUS | Status: DC

## 2021-11-29 MED ORDER — FENTANYL CITRATE (PF) 100 MCG/2ML IJ SOLN
INTRAMUSCULAR | Status: DC | PRN
Start: 2021-11-29 — End: 2021-11-29
  Administered 2021-11-29 (×2): 50 ug via INTRAVENOUS

## 2021-11-29 MED ORDER — OXYCODONE HCL 5 MG/5ML PO SOLN: 5.0000 mg | Freq: Once | ORAL | Status: AC | PRN

## 2021-11-29 MED ORDER — ACETAMINOPHEN 500 MG PO TABS
1000.0000 mg | ORAL_TABLET | ORAL | Status: AC
  Administered 2021-11-29: 1000 mg via ORAL

## 2021-11-29 MED ORDER — BUPIVACAINE-EPINEPHRINE 0.5% -1:200000 IJ SOLN
INTRAMUSCULAR | Status: DC | PRN
  Administered 2021-11-29: 20 mL

## 2021-11-29 MED ORDER — ONDANSETRON HCL 4 MG/2ML IJ SOLN
INTRAMUSCULAR | Status: AC
  Filled 2021-11-29: qty 2

## 2021-11-29 MED ORDER — PHENYLEPHRINE 40 MCG/ML (10ML) SYRINGE FOR IV PUSH (FOR BLOOD PRESSURE SUPPORT)
PREFILLED_SYRINGE | INTRAVENOUS | Status: AC
  Filled 2021-11-29: qty 10

## 2021-11-29 MED ORDER — OXYCODONE HCL 5 MG PO TABS
5.0000 mg | ORAL_TABLET | Freq: Once | ORAL | Status: AC | PRN
  Administered 2021-11-29: 5 mg via ORAL

## 2021-11-29 MED ORDER — OXYCODONE HCL 5 MG PO TABS
ORAL_TABLET | ORAL | Status: AC
  Filled 2021-11-29: qty 1

## 2021-11-29 MED ORDER — FENTANYL CITRATE (PF) 250 MCG/5ML IJ SOLN
INTRAMUSCULAR | Status: AC
  Filled 2021-11-29: qty 5

## 2021-11-29 MED ORDER — LACTATED RINGERS IV SOLN: INTRAVENOUS | Status: DC

## 2021-11-29 MED ORDER — CEFAZOLIN SODIUM-DEXTROSE 2-4 GM/100ML-% IV SOLN
INTRAVENOUS | Status: AC
  Filled 2021-11-29: qty 100

## 2021-11-29 MED ORDER — LIDOCAINE 2% (20 MG/ML) 5 ML SYRINGE
INTRAMUSCULAR | Status: DC | PRN
  Administered 2021-11-29: 100 mg via INTRAVENOUS

## 2021-11-29 MED ORDER — 0.9 % SODIUM CHLORIDE (POUR BTL) OPTIME
TOPICAL | Status: DC | PRN
  Administered 2021-11-29: 500 mL

## 2021-11-29 MED ORDER — DEXAMETHASONE SODIUM PHOSPHATE 10 MG/ML IJ SOLN
INTRAMUSCULAR | Status: DC | PRN
  Administered 2021-11-29: 5 mg via INTRAVENOUS

## 2021-11-29 MED ORDER — ONDANSETRON HCL 4 MG/2ML IJ SOLN
INTRAMUSCULAR | Status: DC | PRN
  Administered 2021-11-29: 4 mg via INTRAVENOUS

## 2021-11-29 MED ORDER — MIDAZOLAM HCL 2 MG/2ML IJ SOLN
INTRAMUSCULAR | Status: AC
Start: 2021-11-29 — End: ?
  Filled 2021-11-29: qty 2

## 2021-11-29 MED ORDER — PROPOFOL 10 MG/ML IV BOLUS
INTRAVENOUS | Status: DC | PRN
  Administered 2021-11-29: 200 mg via INTRAVENOUS

## 2021-11-29 MED ORDER — MIDAZOLAM HCL 5 MG/5ML IJ SOLN
INTRAMUSCULAR | Status: DC | PRN
  Administered 2021-11-29: 2 mg via INTRAVENOUS

## 2021-11-29 MED ORDER — AMISULPRIDE (ANTIEMETIC) 5 MG/2ML IV SOLN: 10.0000 mg | Freq: Once | INTRAVENOUS | Status: DC | PRN

## 2021-11-29 MED ORDER — ACETAMINOPHEN 500 MG PO TABS
ORAL_TABLET | ORAL | Status: AC
  Filled 2021-11-29: qty 2

## 2021-11-29 MED ORDER — PHENYLEPHRINE 40 MCG/ML (10ML) SYRINGE FOR IV PUSH (FOR BLOOD PRESSURE SUPPORT)
PREFILLED_SYRINGE | INTRAVENOUS | Status: DC | PRN
  Administered 2021-11-29: 80 ug via INTRAVENOUS
  Administered 2021-11-29 (×2): 120 ug via INTRAVENOUS
  Administered 2021-11-29: 80 ug via INTRAVENOUS

## 2021-11-29 MED ORDER — KETOROLAC TROMETHAMINE 30 MG/ML IJ SOLN
INTRAMUSCULAR | Status: DC | PRN
  Administered 2021-11-29: 30 mg via INTRAVENOUS

## 2021-11-29 MED ORDER — MEPERIDINE HCL 25 MG/ML IJ SOLN: 6.2500 mg | INTRAMUSCULAR | Status: DC | PRN

## 2021-11-29 MED ORDER — OXYCODONE HCL 5 MG PO TABS
5.0000 mg | ORAL_TABLET | Freq: Four times a day (QID) | ORAL | 0 refills | Status: DC | PRN
Start: 2021-11-29 — End: 2022-03-16

## 2021-11-29 MED ORDER — HYDROMORPHONE HCL 1 MG/ML IJ SOLN: 0.2500 mg | INTRAMUSCULAR | Status: DC | PRN

## 2021-11-29 MED ORDER — CEFAZOLIN SODIUM-DEXTROSE 2-4 GM/100ML-% IV SOLN
2.0000 g | INTRAVENOUS | Status: AC
  Administered 2021-11-29: 2 g via INTRAVENOUS

## 2021-11-29 SURGICAL SUPPLY — 43 items
ADH SKN CLS APL DERMABOND .7 (GAUZE/BANDAGES/DRESSINGS) ×1
APL PRP STRL LF DISP 70% ISPRP (MISCELLANEOUS) ×1
BINDER ABDOMINAL 12 ML 46-62 (SOFTGOODS) ×1 IMPLANT
BLADE CLIPPER SENSICLIP SURGIC (BLADE) IMPLANT
BLADE SURG 15 STRL LF DISP TIS (BLADE) ×1 IMPLANT
BLADE SURG 15 STRL SS (BLADE) ×2
CHLORAPREP W/TINT 26 (MISCELLANEOUS) ×2 IMPLANT
COVER BACK TABLE 60X90IN (DRAPES) ×2 IMPLANT
COVER MAYO STAND STRL (DRAPES) ×2 IMPLANT
DECANTER SPIKE VIAL GLASS SM (MISCELLANEOUS) IMPLANT
DERMABOND ADVANCED (GAUZE/BANDAGES/DRESSINGS) ×1
DERMABOND ADVANCED .7 DNX12 (GAUZE/BANDAGES/DRESSINGS) ×2 IMPLANT
DRAPE LAPAROTOMY 100X72 PEDS (DRAPES) ×2 IMPLANT
DRAPE UTILITY XL STRL (DRAPES) ×2 IMPLANT
DRSG TEGADERM 2-3/8X2-3/4 SM (GAUZE/BANDAGES/DRESSINGS) IMPLANT
ELECT REM PT RETURN 9FT ADLT (ELECTROSURGICAL) ×2
ELECTRODE REM PT RTRN 9FT ADLT (ELECTROSURGICAL) ×1 IMPLANT
GAUZE 4X4 16PLY ~~LOC~~+RFID DBL (SPONGE) ×2 IMPLANT
GLOVE SURG POLYISO LF SZ7 (GLOVE) ×1 IMPLANT
GLOVE SURG PR MICRO ENCORE 7.5 (GLOVE) ×2 IMPLANT
GOWN STRL REUS W/TWL LRG LVL3 (GOWN DISPOSABLE) ×4 IMPLANT
KIT TURNOVER CYSTO (KITS) ×2 IMPLANT
LIGASURE IMPACT 36 18CM CVD LR (INSTRUMENTS) IMPLANT
MESH VENTRALEX ST 2.5 CRC MED (Mesh General) ×1 IMPLANT
NDL HYPO 25X1 1.5 SAFETY (NEEDLE) ×1 IMPLANT
NEEDLE HYPO 25X1 1.5 SAFETY (NEEDLE) ×2 IMPLANT
NS IRRIG 1000ML POUR BTL (IV SOLUTION) IMPLANT
PACK BASIN DAY SURGERY FS (CUSTOM PROCEDURE TRAY) ×2 IMPLANT
PENCIL SMOKE EVACUATOR (MISCELLANEOUS) ×2 IMPLANT
SLEEVE SCD COMPRESS KNEE MED (STOCKING) ×2 IMPLANT
SPONGE T-LAP 4X18 ~~LOC~~+RFID (SPONGE) IMPLANT
SUT MNCRL AB 4-0 PS2 18 (SUTURE) ×2 IMPLANT
SUT NOVA 0 T19/GS 22DT (SUTURE) IMPLANT
SUT NOVA NAB DX-16 0-1 5-0 T12 (SUTURE) ×3 IMPLANT
SUT NOVA NAB GS-21 1 T12 (SUTURE) IMPLANT
SUT VIC AB 2-0 SH 27 (SUTURE)
SUT VIC AB 2-0 SH 27XBRD (SUTURE) IMPLANT
SUT VIC AB 3-0 SH 27 (SUTURE) ×2
SUT VIC AB 3-0 SH 27X BRD (SUTURE) ×1 IMPLANT
SYR CONTROL 10ML LL (SYRINGE) ×2 IMPLANT
TOWEL OR 17X26 10 PK STRL BLUE (TOWEL DISPOSABLE) ×2 IMPLANT
TUBE CONNECTING 12X1/4 (SUCTIONS) IMPLANT
YANKAUER SUCT BULB TIP NO VENT (SUCTIONS) IMPLANT

## 2021-11-29 NOTE — Anesthesia Postprocedure Evaluation (Signed)
Anesthesia Post Note ? ?Patient: Michael Church ? ?Procedure(s) Performed: OPEN INCISIONAL HERNIA REPAIR WITH MESH (Abdomen) ? ?  ? ?Patient location during evaluation: PACU ?Anesthesia Type: General ?Level of consciousness: sedated and patient cooperative ?Pain management: pain level controlled ?Vital Signs Assessment: post-procedure vital signs reviewed and stable ?Respiratory status: spontaneous breathing ?Cardiovascular status: stable ?Anesthetic complications: no ? ? ?No notable events documented. ? ?Last Vitals:  ?Vitals:  ? 11/29/21 1000 11/29/21 1030  ?BP: (!) 145/90 133/78  ?Pulse: 71 65  ?Resp: 17 18  ?Temp:  36.6 ?C  ?SpO2: 96% 99%  ?  ?Last Pain:  ?Vitals:  ? 11/29/21 1110  ?TempSrc:   ?PainSc: 3   ? ? ?  ?  ?  ?  ?  ?  ? ?Nolon Nations ? ? ? ? ?

## 2021-11-29 NOTE — Discharge Instructions (Addendum)
[facility] _______[facility] ? ?UMBILICAL OR INGUINAL HERNIA REPAIR: POST OP INSTRUCTIONS ? ?Always review your discharge instruction sheet given to you by the facility where your surgery was performed. ?IF YOU HAVE DISABILITY OR FAMILY LEAVE FORMS, YOU MUST BRING THEM TO THE OFFICE FOR PROCESSING.   ?DO NOT GIVE THEM TO YOUR DOCTOR. ? ?1. A  prescription for pain medication may be given to you upon discharge.  Take your pain medication as prescribed, if needed.  If narcotic pain medicine is not needed, then you may take acetaminophen (Tylenol) after 1:15 pm or ibuprofen (Advil) after 3:00 pm as needed. ?2. Take your usually prescribed medications unless otherwise directed. ?If you need a refill on your pain medication, please contact your pharmacy.  They will contact our office to request authorization. Prescriptions will not be filled after 5 pm or on week-ends. ?3. You should follow a light diet the first 24 hours after arrival home, such as soup and crackers, etc.  Be sure to include lots of fluids daily.  Resume your normal diet the day after surgery. ?4.Most patients will experience some swelling and bruising around the umbilicus or in the groin and scrotum.  Ice packs and reclining will help.  Swelling and bruising can take several days to resolve.  ?6. It is common to experience some constipation if taking pain medication after surgery.  Increasing fluid intake and taking a stool softener (such as Colace) will usually help or prevent this problem from occurring.  A mild laxative (Milk of Magnesia or Miralax) should be taken according to package directions if there are no bowel movements after 48 hours. ?7. Unless discharge instructions indicate otherwise, you may remove your bandages 24-48 hours after surgery, and you may shower at that time.  You may have steri-strips (small skin tapes) in place directly over the incision.  These strips should be left on the skin for 7-10 days.  If your surgeon  used skin glue on the incision, you may shower in 24 hours.  The glue will flake off over the next 2-3 weeks.  Any sutures or staples will be removed at the office during your follow-up visit. ?8. ACTIVITIES:  You may resume regular (light) daily activities beginning the next day--such as daily self-care, walking, climbing stairs--gradually increasing activities as tolerated.  You may have sexual intercourse when it is comfortable.  Refrain from any heavy lifting or straining until approved by your doctor. ? ?a.You may drive when you are no longer taking prescription pain medication, you can comfortably wear a seatbelt, and you can safely maneuver your car and apply brakes. ?b.RETURN TO WORK:   ?_____________________________________________ ? ?9.You should see your doctor in the office for a follow-up appointment approximately 2-3 weeks after your surgery.  Make sure that you call for this appointment within a day or two after you arrive home to insure a convenient appointment time. ?10.OTHER INSTRUCTIONS: _OK TO SHOWER STARTING TOMORROW ?ICE PACK, TYLENOL, AND IBUPROFEN ALSO FOR PAIN ?NO LIFTING MORE THAN 15 TO 20 POUNDS FOR 4 WEEKS ?________________________ ?   _____________________________________ ? ?WHEN TO CALL YOUR DOCTOR: ?Fever over 101.0 ?Inability to urinate ?Nausea and/or vomiting ?Extreme swelling or bruising ?Continued bleeding from incision. ?Increased pain, redness, or drainage from the incision ? ?The clinic staff is available to answer your questions during regular business hours.  Please don?t hesitate to call and ask to speak to one of the nurses for clinical concerns.  If you have a medical emergency, go  to the nearest emergency room or call 911.  A surgeon from [facility] is always on call at the hospital ? ? ?[address] [place], [place], [place]  [address] ? ? [address], [place], [place]   [address] ?[phone] ? [phone] ? FAX [phone] ?Web site:  [email or web address]  ? ? ?Post Anesthesia Home Care Instructions ? ?Activity: ?Get plenty of rest for the remainder of the day. A responsible adult should stay with you for 24 hours following the procedure.  ?For the next 24 hours, DO NOT: ?-Drive a car ?-Paediatric nurse ?-Drink alcoholic beverages ?-Take any medication unless instructed by your physician ?-Make any legal decisions or sign important papers. ? ?Meals: ?Start with liquid foods such as gelatin or soup. Progress to regular foods as tolerated. Avoid greasy, spicy, heavy foods. If nausea and/or vomiting occur, drink only clear liquids until the nausea and/or vomiting subsides. Call your physician if vomiting continues. ? ?Special Instructions/Symptoms: ?Your throat may feel dry or sore from the anesthesia or the breathing tube placed in your throat during surgery. If this causes discomfort, gargle with warm salt water. The discomfort should disappear within 24 hours. ? ?

## 2021-11-29 NOTE — Interval H&P Note (Signed)
History and Physical Interval Note: no change in H and P ? ?11/29/2021 ?8:06 AM ? ?Michael Church  has presented today for surgery, with the diagnosis of INCISIONAL HERNIA.  The various methods of treatment have been discussed with the patient and family. After consideration of risks, benefits and other options for treatment, the patient has consented to  Procedure(s): ?OPEN INCISIONAL HERNIA REPAIR WITH MESH (N/A) as a surgical intervention.  The patient's history has been reviewed, patient examined, no change in status, stable for surgery.  I have reviewed the patient's chart and labs.  Questions were answered to the patient's satisfaction.   ? ? ?Coralie Keens ? ? ?

## 2021-11-29 NOTE — Anesthesia Procedure Notes (Signed)
Procedure Name: LMA Insertion ?Date/Time: 11/29/2021 8:39 AM ?Performed by: Rogers Blocker, CRNA ?Pre-anesthesia Checklist: Patient identified, Emergency Drugs available, Suction available and Patient being monitored ?Patient Re-evaluated:Patient Re-evaluated prior to induction ?Oxygen Delivery Method: Circle System Utilized ?Preoxygenation: Pre-oxygenation with 100% oxygen ?Induction Type: IV induction ?Ventilation: Mask ventilation without difficulty ?LMA: LMA inserted ?LMA Size: 5.0 ?Number of attempts: 1 ?Airway Equipment and Method: Bite block ?Placement Confirmation: positive ETCO2 ?Tube secured with: Tape ?Dental Injury: Teeth and Oropharynx as per pre-operative assessment  ? ? ? ? ?

## 2021-11-29 NOTE — Transfer of Care (Signed)
Immediate Anesthesia Transfer of Care Note ? ?Patient: Deren Degrazia ? ?Procedure(s) Performed: OPEN INCISIONAL HERNIA REPAIR WITH MESH (Abdomen) ? ?Patient Location: PACU ? ?Anesthesia Type:General ? ?Level of Consciousness: awake, patient cooperative and responds to stimulation ? ?Airway & Oxygen Therapy: Patient Spontanous Breathing and Patient connected to face mask oxygen ? ?Post-op Assessment: Report given to RN and Post -op Vital signs reviewed and stable ? ?Post vital signs: Reviewed and stable ? ?Last Vitals:  ?Vitals Value Taken Time  ?BP 147/91 11/29/21 0930  ?Temp    ?Pulse 78 11/29/21 0937  ?Resp 18 11/29/21 0937  ?SpO2 98 % 11/29/21 0937  ?Vitals shown include unvalidated device data. ? ?Last Pain:  ?Vitals:  ? 11/29/21 0708  ?TempSrc: Oral  ?PainSc: 0-No pain  ?   ? ?Patients Stated Pain Goal: 6 (11/29/21 0708) ? ?Complications: No notable events documented. ?

## 2021-11-29 NOTE — Op Note (Signed)
OPEN INCISIONAL HERNIA REPAIR WITH MESH  Procedure Note ? ?Michael Church ?11/29/2021 ? ? ?Pre-op Diagnosis: INCISIONAL HERNIA (2 cm Fascial Defect) ?    ?Post-op Diagnosis: same ? ?Procedure(s): ?OPEN INCISIONAL HERNIA REPAIR WITH MESH  ? ?Surgeon(s): ?Coralie Keens, MD ? ?Assistant: Albin Felling, MD  [facility] Resident ? ?Anesthesia: General ? ?Staff:  ?Circulator: Aura Fey, RN ?Scrub Person: Lavell Anchors ? ?Estimated Blood Loss: Minimal ?              ?Findings: The patient was found to have a 2 cm fascial defect.  The hernia was repaired with a 6.4 Prolene ventral patch from [facility] ? ?Procedure: The patient is brought to operating identifies a correct patient.  He is placed upon the operating table and general anesthesia was induced.  His abdomen was prepped and draped in the usual sterile fashion.  The skin was anesthetized around his previous scar above the umbilicus where the hernia was present.  This was done with Marcaine.  An elliptical incision was then made with a scalpel to remove the overlying scar.  The overlying skin and scar were then removed with the cautery.  The dissection was then carried down to the hernia sac which was easily identified.  The sac was found to contain omentum.  The sac was excised and the omentum was reduced back into the abdominal cavity.  The fascial defect was 2 cm in size.  The subcutaneous tissue was undermined and over the top of the fascia circumferentially.  A 6.4 round ventral Prolene patch from [facility] was brought onto the field.  It was placed through the fascia opening and then pulled up against the peritoneum with the ties.  The mesh was then sutured in place circumferentially with #1 Novafil sutures.  The ties were then cut and the fascia was closed over the top of the mesh with figure-of-eight #1 Novafil sutures.  Wide coverage of the defect and good closure appeared to be achieved.  Hemostasis appeared to be achieved.  The fascia was then  anesthetized circumferentially with Marcaine.  The subcutaneous tissue was then closed with interrupted 3-0 Vicryl sutures and the skin was closed with a running 4-0 Monocryl.  Dermabond was then applied.  The patient tolerated the procedure well.  All the counts were correct at the end of the procedure.  The patient was then extubated in the operating room and taken in stable condition to the recovery room. ?        ? ?Coralie Keens  ? ?Date: 11/29/2021  Time: 9:13 AM ? ? ? ?

## 2021-11-30 ENCOUNTER — Encounter (HOSPITAL_BASED_OUTPATIENT_CLINIC_OR_DEPARTMENT_OTHER): Payer: Self-pay | Admitting: Surgery

## 2022-01-21 ENCOUNTER — Other Ambulatory Visit: Payer: Self-pay | Admitting: Adult Health

## 2022-01-21 DIAGNOSIS — E119 Type 2 diabetes mellitus without complications: Secondary | ICD-10-CM

## 2022-02-15 LAB — HM DIABETES EYE EXAM

## 2022-03-16 ENCOUNTER — Emergency Department (HOSPITAL_COMMUNITY)
Admission: EM | Admit: 2022-03-16 | Discharge: 2022-03-16 | Disposition: A | Discharge status: Home / Self Care | Payer: No Typology Code available for payment source | Attending: Emergency Medicine | Admitting: Emergency Medicine

## 2022-03-16 ENCOUNTER — Emergency Department (HOSPITAL_COMMUNITY): Payer: No Typology Code available for payment source

## 2022-03-16 ENCOUNTER — Encounter (HOSPITAL_COMMUNITY): Payer: Self-pay

## 2022-03-16 DIAGNOSIS — Z7984 Long term (current) use of oral hypoglycemic drugs: Secondary | ICD-10-CM | POA: Insufficient documentation

## 2022-03-16 DIAGNOSIS — R1032 Left lower quadrant pain: Secondary | ICD-10-CM | POA: Diagnosis present

## 2022-03-16 DIAGNOSIS — E1165 Type 2 diabetes mellitus with hyperglycemia: Secondary | ICD-10-CM | POA: Insufficient documentation

## 2022-03-16 DIAGNOSIS — K5732 Diverticulitis of large intestine without perforation or abscess without bleeding: Secondary | ICD-10-CM | POA: Diagnosis not present

## 2022-03-16 DIAGNOSIS — Z7982 Long term (current) use of aspirin: Secondary | ICD-10-CM | POA: Diagnosis not present

## 2022-03-16 LAB — CBC WITH DIFFERENTIAL/PLATELET
Abs Immature Granulocytes: 0.01 10*3/uL (ref 0.00–0.07)
Basophils Absolute: 0 10*3/uL (ref 0.0–0.1)
Basophils Relative: 0 %
Eosinophils Absolute: 0 10*3/uL (ref 0.0–0.5)
Eosinophils Relative: 0 %
HCT: 47.5 % (ref 39.0–52.0)
Hemoglobin: 15.9 g/dL (ref 13.0–17.0)
Immature Granulocytes: 0 %
Lymphocytes Relative: 10 %
Lymphs Abs: 0.6 10*3/uL — ABNORMAL LOW (ref 0.7–4.0)
MCH: 28.9 pg (ref 26.0–34.0)
MCHC: 33.5 g/dL (ref 30.0–36.0)
MCV: 86.2 fL (ref 80.0–100.0)
Monocytes Absolute: 0.4 10*3/uL (ref 0.1–1.0)
Monocytes Relative: 6 %
Neutro Abs: 5.1 10*3/uL (ref 1.7–7.7)
Neutrophils Relative %: 84 %
Platelets: 226 10*3/uL (ref 150–400)
RBC: 5.51 MIL/uL (ref 4.22–5.81)
RDW: 12.8 % (ref 11.5–15.5)
WBC: 6.2 10*3/uL (ref 4.0–10.5)
nRBC: 0 % (ref 0.0–0.2)

## 2022-03-16 LAB — COMPREHENSIVE METABOLIC PANEL
ALT: 25 U/L (ref 0–44)
AST: 23 U/L (ref 15–41)
Albumin: 4.6 g/dL (ref 3.5–5.0)
Alkaline Phosphatase: 44 U/L (ref 38–126)
Anion gap: 9 (ref 5–15)
BUN: 12 mg/dL (ref 8–23)
CO2: 28 mmol/L (ref 22–32)
Calcium: 9.6 mg/dL (ref 8.9–10.3)
Chloride: 100 mmol/L (ref 98–111)
Creatinine, Ser: 1.03 mg/dL (ref 0.61–1.24)
GFR, Estimated: >60 mL/min (ref >60)
Glucose, Bld: 116 mg/dL — ABNORMAL HIGH (ref 70–99)
Potassium: 4 mmol/L (ref 3.5–5.1)
Sodium: 137 mmol/L (ref 135–145)
Total Bilirubin: 1 mg/dL (ref 0.3–1.2)
Total Protein: 8 g/dL (ref 6.5–8.1)

## 2022-03-16 LAB — URINALYSIS, ROUTINE W REFLEX MICROSCOPIC
Bacteria, UA: NONE SEEN
Bilirubin Urine: NEGATIVE
Glucose, UA: NEGATIVE mg/dL
Ketones, ur: NEGATIVE mg/dL
Leukocytes,Ua: NEGATIVE
Nitrite: NEGATIVE
Protein, ur: NEGATIVE mg/dL
Specific Gravity, Urine: 1.041 — ABNORMAL HIGH (ref 1.005–1.030)
pH: 7 (ref 5.0–8.0)

## 2022-03-16 LAB — LIPASE, BLOOD: Lipase: 29 U/L (ref 11–51)

## 2022-03-16 MED ORDER — IOHEXOL 300 MG/ML  SOLN
100.0000 mL | Freq: Once | INTRAMUSCULAR | Status: AC | PRN
  Administered 2022-03-16: 100 mL via INTRAVENOUS

## 2022-03-16 MED ORDER — CIPROFLOXACIN HCL 500 MG PO TABS: 500.0000 mg | ORAL_TABLET | Freq: Two times a day (BID) | ORAL | 0 refills | Status: DC

## 2022-03-16 MED ORDER — HYDROMORPHONE HCL 1 MG/ML IJ SOLN
1.0000 mg | Freq: Once | INTRAMUSCULAR | Status: AC
  Administered 2022-03-16: 1 mg via INTRAVENOUS
  Filled 2022-03-16: qty 1

## 2022-03-16 MED ORDER — METRONIDAZOLE 500 MG PO TABS: 500.0000 mg | ORAL_TABLET | Freq: Three times a day (TID) | ORAL | 0 refills | Status: AC

## 2022-03-16 MED ORDER — METRONIDAZOLE 500 MG PO TABS
500.0000 mg | ORAL_TABLET | Freq: Once | ORAL | Status: AC
  Administered 2022-03-16: 500 mg via ORAL
  Filled 2022-03-16: qty 1

## 2022-03-16 MED ORDER — METRONIDAZOLE 500 MG PO TABS: 500.0000 mg | ORAL_TABLET | Freq: Three times a day (TID) | ORAL | 0 refills | Status: DC

## 2022-03-16 MED ORDER — OXYCODONE HCL 5 MG PO TABS: 5.0000 mg | ORAL_TABLET | ORAL | 0 refills | Status: DC | PRN

## 2022-03-16 MED ORDER — SODIUM CHLORIDE (PF) 0.9 % IJ SOLN
INTRAMUSCULAR | Status: AC
  Administered 2022-03-16: 3 mL
  Filled 2022-03-16: qty 50

## 2022-03-16 MED ORDER — CIPROFLOXACIN HCL 500 MG PO TABS
500.0000 mg | ORAL_TABLET | Freq: Once | ORAL | Status: AC
  Administered 2022-03-16: 500 mg via ORAL
  Filled 2022-03-16: qty 1

## 2022-03-16 MED ORDER — LACTATED RINGERS IV BOLUS
1000.0000 mL | Freq: Once | INTRAVENOUS | Status: AC
Start: 2022-03-16 — End: 2022-03-16
  Administered 2022-03-16: 1000 mL via INTRAVENOUS

## 2022-03-16 NOTE — Discharge Instructions (Signed)
You are being given 2 different antibiotics to treat diverticulitis.  You are also being prescribed oxycodone.  Use ibuprofen and Tylenol for pain and if you still have significant or worsening pain then try the oxycodone.  If you develop worsening, continued, or recurrent abdominal pain, uncontrolled vomiting, fever, chest or back pain, or any other new/concerning symptoms then return to the ER for evaluation.

## 2022-03-16 NOTE — ED Provider Notes (Signed)
[facility] DEPT Provider Note   CSN: [id number] Arrival date & time: 03/16/22  4917     History  Chief Complaint  Patient presents with   Abdominal Pain    Michael Church is a 66 y.o. male.  HPI 66 year old male presents with acute left lower quadrant abdominal pain.  He states he first noticed it last night when he was out and it would get worse as he was ambulating around.  It has worsened ever since and is fairly uncomfortable if he is not sitting perfectly still.  No fevers or vomiting.  No back pain or testicular pain.  No urinary symptoms.  Last had a bowel movement yesterday.  No rectal bleeding.  Took Tylenol and MiraLAX as he thought it might be a BM problem but neither of these has helped.  Earlier this year he had an abdominal hernia repair and he has a remote history of pancreatitis.  Home Medications Prior to Admission medications   Medication Sig Start Date End Date Taking? Authorizing Provider  aspirin EC 81 MG tablet Take 81 mg by mouth daily as needed.    [provider]  blood glucose meter kit and supplies KIT Dispense based on patient and insurance preference. Use up to three times daily as directed. 12/16/20   Nafziger, Tommi Rumps, NP  Coenzyme Q10 (COQ10 PO) Take 1 tablet by mouth at bedtime.    [provider]  EPINEPHrine 0.3 mg/0.3 mL IJ SOAJ injection Inject 0.3 mg into the muscle as needed. 09/08/21   [provider]  famotidine (PEPCID) 20 MG tablet Take 20 mg by mouth daily as needed. 08/28/21   [provider]  hydrochlorothiazide (MICROZIDE) 12.5 MG capsule TAKE 1 CAPSULE(12.5 MG) BY MOUTH DAILY Patient taking differently: Take 12.5 mg by mouth daily. 06/28/21   Nafziger, Tommi Rumps, NP  metFORMIN (GLUCOPHAGE) 500 MG tablet TAKE 1 TABLET(500 MG) BY MOUTH TWICE DAILY WITH A MEAL 01/23/22   Nafziger, Tommi Rumps, NP  Multiple Vitamin (MULTIVITAMIN WITH MINERALS) TABS Take 1 tablet by mouth daily.     [provider]  NIACIN PO Take by mouth daily.    [provider]  OVER THE COUNTER MEDICATION Take by mouth daily. Tribulus testosterone supplement    [provider]  oxyCODONE (OXY IR/ROXICODONE) 5 MG immediate release tablet Take 1 tablet (5 mg total) by mouth every 6 (six) hours as needed for moderate pain or severe pain. 11/29/21   Coralie Keens, MD  oxymetazoline (AFRIN) 0.05 % nasal spray Place 1 spray into both nostrils 2 (two) times daily as needed (nose bleeds).    [provider]  PRESCRIPTION MEDICATION PER PT ALLERGY INJECTION WKLY AND TWICE PER MONTH IN SUMMER    [provider]  saw palmetto 500 MG capsule Take 500 mg by mouth 2 (two) times daily.    [provider]  sodium chloride (OCEAN) 0.65 % SOLN nasal spray Place 1 spray into both nostrils as needed for congestion.    [provider]  triamcinolone (NASACORT) 55 MCG/ACT AERO nasal inhaler Place 2 sprays into the nose daily. 2 sprays each nostril at night 03/07/21 11/29/21  Rozetta Nunnery, MD  TURMERIC PO Take 1 tablet by mouth 2 (two) times daily.    [provider]      Allergies    Other and Penicillins    Review of Systems   Review of Systems  Constitutional:  Negative for fever.  Gastrointestinal:  Positive for abdominal  pain. Negative for vomiting.  Genitourinary:  Negative for dysuria.  Musculoskeletal:  Negative for back pain.    Physical Exam Updated Vital Signs BP (!) 143/85 (BP Location: Left Arm)   Pulse 83   Temp 98.8 F (37.1 C) (Oral)   Resp 18   Ht $R'5\' 11"'Pr$  (1.803 m)   Wt 110.7 kg   SpO2 99%   BMI 34.03 kg/m  Physical Exam Vitals and nursing note reviewed.  Constitutional:      Appearance: He is well-developed. He is not ill-appearing or diaphoretic.  HENT:     Head: Normocephalic and atraumatic.  Cardiovascular:     Rate and Rhythm: Normal rate and regular rhythm.     Heart sounds: Normal heart sounds.   Pulmonary:     Effort: Pulmonary effort is normal.     Breath sounds: Normal breath sounds.  Abdominal:     Palpations: Abdomen is soft.     Tenderness: There is abdominal tenderness (generalized tenderness but is worst in LLQ) in the left lower quadrant.  Skin:    General: Skin is warm and dry.  Neurological:     Mental Status: He is alert.     ED Results / Procedures / Treatments   Labs (all labs ordered are listed, but only abnormal results are displayed) Labs Reviewed - No data to display  EKG None  Radiology No results found.  Procedures Procedures    Medications Ordered in ED Medications  lactated ringers bolus 1,000 mL (has no administration in time range)  HYDROmorphone (DILAUDID) injection 1 mg (has no administration in time range)    ED Course/ Medical Decision Making/ A&P                           Medical Decision Making Amount and/or Complexity of Data Reviewed External Data Reviewed: notes. Labs: ordered.    Details: Normal WBC, normal electrolytes save for slightly elevated glucose and normal LFTs.  Lipase is normal. Radiology: ordered and independent interpretation performed.    Details: Diverticulitis without abscess or perforation.  Risk Prescription drug management.   CT scan confirms acute diverticulitis.  There is no evidence of complication.  Personally viewed/interpreted these images.  He is otherwise well-appearing and feels better with IV Dilaudid.  He is a diabetic which increases his risk of complications but he otherwise is well-appearing and pain is controlled.  I think is reasonable to do outpatient antibiotics.  He is allergic to penicillin so we will need to do Cipro/Flagyl.  We will also prescribe oral pain control with oxycodone.  Given return precautions.        Final Clinical Impression(s) / ED Diagnoses Final diagnoses:  None    Rx / DC Orders ED Discharge Orders     None         Sherwood Gambler, MD 03/16/22  1351

## 2022-03-16 NOTE — ED Triage Notes (Signed)
Pt arrived via POV, c/o LLQ abd pain. Denies any n/v or diarrhea. Denies any urinary sx.

## 2022-03-23 ENCOUNTER — Ambulatory Visit: Payer: No Typology Code available for payment source | Admitting: Adult Health

## 2022-03-27 ENCOUNTER — Ambulatory Visit: Payer: No Typology Code available for payment source | Admitting: Adult Health

## 2022-03-27 ENCOUNTER — Encounter: Payer: Self-pay | Admitting: Adult Health

## 2022-03-27 VITALS — BP 118/82 | HR 71 | Temp 98.7°F | Ht 71.5 in | Wt 242.0 lb

## 2022-03-27 DIAGNOSIS — E782 Mixed hyperlipidemia: Secondary | ICD-10-CM | POA: Diagnosis not present

## 2022-03-27 DIAGNOSIS — I10 Essential (primary) hypertension: Secondary | ICD-10-CM | POA: Diagnosis not present

## 2022-03-27 DIAGNOSIS — E119 Type 2 diabetes mellitus without complications: Secondary | ICD-10-CM | POA: Diagnosis not present

## 2022-03-27 LAB — COMPREHENSIVE METABOLIC PANEL
ALT: 28 U/L (ref 0–53)
AST: 20 U/L (ref 0–37)
Albumin: 4.2 g/dL (ref 3.5–5.2)
Alkaline Phosphatase: 37 U/L — ABNORMAL LOW (ref 39–117)
BUN: 10 mg/dL (ref 6–23)
CO2: 29 mEq/L (ref 19–32)
Calcium: 9.4 mg/dL (ref 8.4–10.5)
Chloride: 100 mEq/L (ref 96–112)
Creatinine, Ser: 1.03 mg/dL (ref 0.40–1.50)
GFR: 76.99 mL/min (ref >60.00)
Glucose, Bld: 115 mg/dL — ABNORMAL HIGH (ref 70–99)
Potassium: 4.2 mEq/L (ref 3.5–5.1)
Sodium: 138 mEq/L (ref 135–145)
Total Bilirubin: 0.5 mg/dL (ref 0.2–1.2)
Total Protein: 6.9 g/dL (ref 6.0–8.3)

## 2022-03-27 LAB — LIPID PANEL
Cholesterol: 154 mg/dL (ref 0–200)
HDL: 54.1 mg/dL (ref >39.00)
LDL Cholesterol: 80 mg/dL (ref 0–99)
NonHDL: 99.89
Total CHOL/HDL Ratio: 3
Triglycerides: 101 mg/dL (ref 0.0–149.0)
VLDL: 20.2 mg/dL (ref 0.0–40.0)

## 2022-03-27 LAB — HEMOGLOBIN A1C: Hgb A1c MFr Bld: 6.7 % — ABNORMAL HIGH (ref 4.6–6.5)

## 2022-03-27 NOTE — Progress Notes (Signed)
Subjective:    Patient ID: Michael Church, male    DOB: 07/04/1956, 66 y.o.   MRN: [id number]  HPI 66 year old male who  has a past medical history of Chronic rhinitis, GERD (gastroesophageal reflux disease), Herniated lumbar intervertebral disc, History of epistaxis, History of kidney stones, History of renal cell carcinoma (08/2016), Hypertension, Incisional hernia, Nocturia, OA (osteoarthritis), Seasonal allergies, Type 2 diabetes mellitus (HCC), and Varicose vein of leg.  He presents to the office today for 9-month follow-up regarding diabetes type 2, hypertension, and hyperlipidemia.  Diabetes mellitus type 2-managed with metformin 500 mg twice daily.  He does check his blood sugars at home periodically with readings in the 120s to 130s.  He denies symptoms of hypoglycemia. Lab Results  Component Value Date   HGBA1C 6.8 (H) 09/22/2021    Hypertension-managed with HCTZ 12.5 mg daily.  He denies dizziness, lightheadedness, chest pain, shortness of breath, or syncopal episodes BP Readings from Last 3 Encounters:  03/27/22 118/82  03/16/22 (!) 161/96  11/29/21 133/78    Hyperlipidemia-he has been reluctant to go on a statin in the past.  He wanted to work on lifestyle modifications first. He has been walking daily and is eating healthier.  Lab Results  Component Value Date   CHOL 200 09/22/2021   HDL 64.10 09/22/2021   LDLCALC 118 (H) 09/22/2021   TRIG 93.0 09/22/2021   CHOLHDL 3 09/22/2021    Review of Systems See HPI   Past Medical History:  Diagnosis Date   Chronic rhinitis    followed by dr Irena Cords   GERD (gastroesophageal reflux disease)    Herniated lumbar intervertebral disc    not a problem presently   History of epistaxis    History of kidney stones    History of renal cell carcinoma 08/2016   urologist--- dr Marlou Porch;   s/p right partial nephrecotmy for papillary RCC, no chemo / radiation  (previously seen by oncology--- dr Bertis Ruddy, lov note in epic  04-04-2017)   Hypertension    Incisional hernia    Nocturia    OA (osteoarthritis)    knees   Seasonal allergies    Type 2 diabetes mellitus (HCC)    followed by pcp---  (11-27-2021  per pt check blood sugar twice daily, fasting sugar---112--140)   Varicose vein of leg     Social History   Socioeconomic History   Marital status: Married    Spouse name: Sue Lush   Number of children: 2   Years of education: Not on file   Highest education level: Bachelor's degree (e.g., BA, AB, BS)  Occupational History   Occupation: Code and compliance    Employer: [employer]  Tobacco Use   Smoking status: Some Days    Types: Cigars   Smokeless tobacco: Never   Tobacco comments:    11-27-2021  Per pt average one cigar every 2-3 months  Vaping Use   Vaping Use: Never used  Substance and Sexual Activity   Alcohol use: Not Currently    Comment: occasional   Drug use: No   Sexual activity: Yes  Other Topics Concern   Not on file  Social History Narrative   Retired from post office    Works part time for the [employer]    Social Determinants of [employer]: Low Risk  (03/26/2022)   Overall Financial Resource Strain (CARDIA)    Difficulty of Paying Living Expenses: Not hard at all  Food Insecurity: No Food Insecurity (03/26/2022)   Hunger Vital Sign    Worried About Running Out of Food in the Last Year: Never true    Ran Out of Food in the Last Year: Never true  Transportation Needs: No Transportation Needs (03/26/2022)   PRAPARE - Hydrologist (Medical): No    Lack of Transportation (Non-Medical): No  Physical Activity: Insufficiently Active (03/26/2022)   Exercise Vital Sign    Days of Exercise per Week: 4 days    Minutes of Exercise per Session: 20 min  Stress: Unknown (03/26/2022)   [place]    Feeling of Stress : Patient refused  Social  Connections: Unknown (03/26/2022)   Social Connection and Isolation Panel [NHANES]    Frequency of Communication with Friends and Family: Once a week    Frequency of Social Gatherings with Friends and Family: Patient refused    Attends Religious Services: Patient refused    Active Member of Clubs or Organizations: Yes    Attends Archivist Meetings: Patient refused    Marital Status: Married  Human resources officer Violence: Not on file    Past Surgical History:  Procedure Laterality Date   CYSTECTOMY     per pt as child had a cyst removed that had was deep in chest wall   EXCISION MASS NECK  10/2011   posterior neck   INCISIONAL HERNIA REPAIR N/A 11/29/2021   Procedure: OPEN INCISIONAL HERNIA REPAIR WITH MESH;  Surgeon: Coralie Keens, MD;  Location: [facility];  Service: General;  Laterality: N/A;   KNEE ARTHROSCOPY W/ MENISCAL REPAIR Bilateral 03/26/2011   '@[facility]'$  by dr Tonita Cong,  left knee//    right knee in 2017   MASS EXCISION N/A 09/11/2017   Procedure: [place], EXCISION OF GLUTEAL MASS;  Surgeon: Ralene Ok, MD;  Location: [facility];  Service: General;  Laterality: N/A;   ROBOTIC ASSITED PARTIAL NEPHRECTOMY Right 09/07/2016   Procedure: XI ROBOTIC ASSITED LAPAROSCOPIC RIGHT PARTIAL NEPHRECTOMY;  Surgeon: Ardis Hughs, MD;  Location: [facility];  Service: Urology;  Laterality: Right;   TONSILLECTOMY     child   VARICOSE VEIN SURGERY Bilateral 01/2013   per pt stripping    Family History  Problem Relation Age of Onset   Stroke Father        age 58   Cancer Brother        Hodgkin   HIV/AIDS Brother        age 64    Colon cancer Neg Hx    Esophageal cancer Neg Hx    Rectal cancer Neg Hx    Stomach cancer Neg Hx     Allergies  Allergen Reactions   Other Itching    Foods - cantaloupes, tomatoes, bananas, and  raw carrots.   Penicillins Itching    Has patient had a PCN reaction causing immediate rash, facial/tongue/throat  swelling, SOB or lightheadedness with hypotension: No Has patient had a PCN reaction causing severe rash involving mucus membranes or skin necrosis: No Has patient had a PCN reaction that required hospitalization: No Has patient had a PCN reaction occurring within the last 10 years: No If all of the above answers are "NO", then may proceed with Cephalosporin use.     Current Outpatient Medications on File Prior to Visit  Medication Sig Dispense Refill   aspirin EC 81 MG tablet Take 81 mg by mouth daily  as needed.     blood glucose meter kit and supplies KIT Dispense based on patient and insurance preference. Use up to three times daily as directed. 1 each 0   Coenzyme Q10 (COQ10 PO) Take 1 tablet by mouth at bedtime.     EPINEPHrine 0.3 mg/0.3 mL IJ SOAJ injection Inject 0.3 mg into the muscle as needed.     famotidine (PEPCID) 20 MG tablet Take 20 mg by mouth daily as needed.     GELSYN-3 16.8 MG/2ML SOSY intra-articular injection      hydrochlorothiazide (MICROZIDE) 12.5 MG capsule TAKE 1 CAPSULE(12.5 MG) BY MOUTH DAILY (Patient taking differently: Take 12.5 mg by mouth daily.) 90 capsule 3   metFORMIN (GLUCOPHAGE) 500 MG tablet TAKE 1 TABLET(500 MG) BY MOUTH TWICE DAILY WITH A MEAL 180 tablet 0   Multiple Vitamin (MULTIVITAMIN WITH MINERALS) TABS Take 1 tablet by mouth daily.     NIACIN PO Take by mouth daily.     OVER THE COUNTER MEDICATION Take by mouth daily. Tribulus testosterone supplement     oxymetazoline (AFRIN) 0.05 % nasal spray Place 1 spray into both nostrils 2 (two) times daily as needed (nose bleeds).     PRESCRIPTION MEDICATION PER PT ALLERGY INJECTION WKLY AND TWICE PER MONTH IN SUMMER     saw palmetto 500 MG capsule Take 500 mg by mouth 2 (two) times daily.     sodium chloride (OCEAN) 0.65 % SOLN nasal spray Place 1 spray into both nostrils as needed for congestion.     TURMERIC PO Take 1 tablet by mouth 2 (two) times daily.     triamcinolone (NASACORT) 55 MCG/ACT AERO  nasal inhaler Place 2 sprays into the nose daily. 2 sprays each nostril at night 1 each 12   No current facility-administered medications on file prior to visit.    BP 118/82   Pulse 71   Temp 98.7 F (37.1 C) (Oral)   Ht 5' 11.5" (1.816 m)   Wt 242 lb (109.8 kg)   SpO2 97%   BMI 33.28 kg/m       Objective:   Physical Exam Vitals reviewed.  Constitutional:      Appearance: Normal appearance.  Cardiovascular:     Rate and Rhythm: Normal rate and regular rhythm.     Pulses: Normal pulses.     Heart sounds: Normal heart sounds.  Pulmonary:     Effort: Pulmonary effort is normal.     Breath sounds: Normal breath sounds.  Musculoskeletal:        General: Normal range of motion.  Skin:    General: Skin is warm and dry.  Neurological:     General: No focal deficit present.     Mental Status: He is alert and oriented to person, place, and time.  Psychiatric:        Mood and Affect: Mood normal.        Behavior: Behavior normal.        Thought Content: Thought content normal.        Judgment: Judgment normal.       Assessment & Plan:  1. Essential hypertension - well controlled.  - Comprehensive metabolic panel; Future - Hemoglobin A1c; Future - Lipid panel; Future  2. Type 2 diabetes mellitus without complication, without long-term current use of insulin (HCC) - Consider increase in statin  - Comprehensive metabolic panel; Future - Hemoglobin A1c; Future - Lipid panel; Future  3. Mixed hyperlipidemia - Likely need statin  - Comprehensive  metabolic panel; Future - Hemoglobin A1c; Future - Lipid panel; Future  Dorothyann Peng, NP

## 2022-03-29 ENCOUNTER — Other Ambulatory Visit: Payer: Self-pay

## 2022-03-29 MED ORDER — SIMVASTATIN 5 MG PO TABS: 5.0000 mg | ORAL_TABLET | Freq: Every day | ORAL | 2 refills | Status: DC

## 2022-06-01 ENCOUNTER — Other Ambulatory Visit: Payer: Self-pay | Admitting: Adult Health

## 2022-06-01 DIAGNOSIS — E119 Type 2 diabetes mellitus without complications: Secondary | ICD-10-CM

## 2022-06-10 ENCOUNTER — Other Ambulatory Visit: Payer: Self-pay | Admitting: Adult Health

## 2022-06-10 DIAGNOSIS — I10 Essential (primary) hypertension: Secondary | ICD-10-CM

## 2022-08-24 NOTE — Telephone Encounter (Signed)
Chart updated

## 2022-09-08 ENCOUNTER — Other Ambulatory Visit: Payer: Self-pay | Admitting: Adult Health

## 2022-09-08 DIAGNOSIS — E119 Type 2 diabetes mellitus without complications: Secondary | ICD-10-CM

## 2022-12-05 ENCOUNTER — Other Ambulatory Visit: Payer: Self-pay | Admitting: Adult Health

## 2022-12-31 ENCOUNTER — Encounter (HOSPITAL_COMMUNITY): Payer: Self-pay

## 2022-12-31 ENCOUNTER — Emergency Department (HOSPITAL_COMMUNITY)
Admission: EM | Admit: 2022-12-31 | Discharge: 2023-01-01 | Disposition: A | Discharge status: Home / Self Care | Payer: No Typology Code available for payment source | Attending: Emergency Medicine | Admitting: Emergency Medicine

## 2022-12-31 ENCOUNTER — Emergency Department (HOSPITAL_COMMUNITY): Payer: No Typology Code available for payment source

## 2022-12-31 DIAGNOSIS — E119 Type 2 diabetes mellitus without complications: Secondary | ICD-10-CM | POA: Diagnosis not present

## 2022-12-31 DIAGNOSIS — R112 Nausea with vomiting, unspecified: Secondary | ICD-10-CM | POA: Diagnosis not present

## 2022-12-31 DIAGNOSIS — Z7984 Long term (current) use of oral hypoglycemic drugs: Secondary | ICD-10-CM | POA: Insufficient documentation

## 2022-12-31 DIAGNOSIS — R197 Diarrhea, unspecified: Secondary | ICD-10-CM | POA: Diagnosis not present

## 2022-12-31 DIAGNOSIS — Z85528 Personal history of other malignant neoplasm of kidney: Secondary | ICD-10-CM | POA: Diagnosis not present

## 2022-12-31 DIAGNOSIS — I10 Essential (primary) hypertension: Secondary | ICD-10-CM | POA: Insufficient documentation

## 2022-12-31 DIAGNOSIS — E871 Hypo-osmolality and hyponatremia: Secondary | ICD-10-CM | POA: Diagnosis not present

## 2022-12-31 DIAGNOSIS — R1084 Generalized abdominal pain: Secondary | ICD-10-CM | POA: Insufficient documentation

## 2022-12-31 DIAGNOSIS — E876 Hypokalemia: Secondary | ICD-10-CM | POA: Diagnosis not present

## 2022-12-31 LAB — CBC WITH DIFFERENTIAL/PLATELET
Abs Immature Granulocytes: 0.02 10*3/uL (ref 0.00–0.07)
Basophils Absolute: 0 10*3/uL (ref 0.0–0.1)
Basophils Relative: 0 %
Eosinophils Absolute: 0 10*3/uL (ref 0.0–0.5)
Eosinophils Relative: 0 %
HCT: 51.3 % (ref 39.0–52.0)
Hemoglobin: 17.1 g/dL — ABNORMAL HIGH (ref 13.0–17.0)
Immature Granulocytes: 0 %
Lymphocytes Relative: 6 %
Lymphs Abs: 0.4 10*3/uL — ABNORMAL LOW (ref 0.7–4.0)
MCH: 28.5 pg (ref 26.0–34.0)
MCHC: 33.3 g/dL (ref 30.0–36.0)
MCV: 85.6 fL (ref 80.0–100.0)
Monocytes Absolute: 0.4 10*3/uL (ref 0.1–1.0)
Monocytes Relative: 6 %
Neutro Abs: 6 10*3/uL (ref 1.7–7.7)
Neutrophils Relative %: 88 %
Platelets: 228 10*3/uL (ref 150–400)
RBC: 5.99 MIL/uL — ABNORMAL HIGH (ref 4.22–5.81)
RDW: 12.5 % (ref 11.5–15.5)
WBC: 6.8 10*3/uL (ref 4.0–10.5)
nRBC: 0 % (ref 0.0–0.2)

## 2022-12-31 LAB — LIPASE, BLOOD: Lipase: 30 U/L (ref 11–51)

## 2022-12-31 LAB — COMPREHENSIVE METABOLIC PANEL
ALT: 37 U/L (ref 0–44)
AST: 37 U/L (ref 15–41)
Albumin: 4.4 g/dL (ref 3.5–5.0)
Alkaline Phosphatase: 43 U/L (ref 38–126)
Anion gap: 13 (ref 5–15)
BUN: 19 mg/dL (ref 8–23)
CO2: 18 mmol/L — ABNORMAL LOW (ref 22–32)
Calcium: 9 mg/dL (ref 8.9–10.3)
Chloride: 99 mmol/L (ref 98–111)
Creatinine, Ser: 1.07 mg/dL (ref 0.61–1.24)
GFR, Estimated: >60 mL/min (ref >60)
Glucose, Bld: 150 mg/dL — ABNORMAL HIGH (ref 70–99)
Potassium: 3.1 mmol/L — ABNORMAL LOW (ref 3.5–5.1)
Sodium: 130 mmol/L — ABNORMAL LOW (ref 135–145)
Total Bilirubin: 0.5 mg/dL (ref 0.3–1.2)
Total Protein: 8.1 g/dL (ref 6.5–8.1)

## 2022-12-31 MED ORDER — KETOROLAC TROMETHAMINE 15 MG/ML IJ SOLN
15.0000 mg | Freq: Once | INTRAMUSCULAR | Status: AC
  Administered 2022-12-31: 15 mg via INTRAVENOUS
  Filled 2022-12-31: qty 1

## 2022-12-31 MED ORDER — HYDROMORPHONE HCL 1 MG/ML IJ SOLN
0.5000 mg | INTRAMUSCULAR | Status: DC | PRN
  Administered 2022-12-31: 0.5 mg via INTRAVENOUS
  Filled 2022-12-31: qty 1

## 2022-12-31 MED ORDER — SODIUM CHLORIDE 0.9 % IV BOLUS
1000.0000 mL | Freq: Once | INTRAVENOUS | Status: AC
  Administered 2022-12-31: 1000 mL via INTRAVENOUS

## 2022-12-31 MED ORDER — HYOSCYAMINE SULFATE 0.125 MG SL SUBL
0.1250 mg | SUBLINGUAL_TABLET | Freq: Once | SUBLINGUAL | Status: AC
  Administered 2022-12-31: 0.125 mg via ORAL
  Filled 2022-12-31: qty 1

## 2022-12-31 MED ORDER — HYOSCYAMINE SULFATE 0.125 MG PO TABS: 0.1250 mg | ORAL_TABLET | Freq: Once | ORAL | Status: DC

## 2022-12-31 NOTE — ED Triage Notes (Signed)
Pt reports that he has been having generalized abd pian with n/v/d since Sat

## 2022-12-31 NOTE — ED Notes (Signed)
Pt ambulatory to room with independent steady gait 

## 2023-01-01 MED ORDER — HYOSCYAMINE SULFATE SL 0.125 MG SL SUBL: 1.0000 | SUBLINGUAL_TABLET | Freq: Four times a day (QID) | SUBLINGUAL | 0 refills | Status: DC | PRN

## 2023-01-01 MED ORDER — CIPROFLOXACIN HCL 500 MG PO TABS: 500.0000 mg | ORAL_TABLET | Freq: Two times a day (BID) | ORAL | 0 refills | Status: AC

## 2023-01-01 MED ORDER — ONDANSETRON 4 MG PO TBDP: 4.0000 mg | ORAL_TABLET | Freq: Three times a day (TID) | ORAL | 0 refills | Status: AC | PRN

## 2023-01-01 NOTE — ED Provider Notes (Signed)
[place] EMERGENCY DEPARTMENT AT [facility] Provider Note  CSN: [id number] Arrival date & time: 12/31/22 2016  Chief Complaint(s) Abdominal Pain  HPI Michael Church is a 67 y.o. male     Abdominal Pain Pain location:  Generalized Pain quality: cramping   Pain radiates to:  Does not radiate Pain severity:  Moderate Onset quality:  Gradual Duration:  2 days Timing:  Intermittent Progression:  Waxing and waning Chronicity:  New Relieved by:  Nothing Worsened by:  Eating and bowel movements Ineffective treatments:  OTC medications Associated symptoms: diarrhea, nausea and vomiting   Associated symptoms: no dysuria, no fatigue, no fever, no hematemesis, no hematochezia, no hematuria and no melena     Past Medical History Past Medical History:  Diagnosis Date   Chronic rhinitis    followed by dr Irena Cords   GERD (gastroesophageal reflux disease)    Herniated lumbar intervertebral disc    not a problem presently   History of epistaxis    History of kidney stones    History of renal cell carcinoma 08/2016   urologist--- dr Marlou Porch;   s/p right partial nephrecotmy for papillary RCC, no chemo / radiation  (previously seen by oncology--- dr Bertis Ruddy, lov note in epic 04-04-2017)   Hypertension    Incisional hernia    Nocturia    OA (osteoarthritis)    knees   Seasonal allergies    Type 2 diabetes mellitus    followed by pcp---  (11-27-2021  per pt check blood sugar twice daily, fasting sugar---112--140)   Varicose vein of leg    Patient Active Problem List   Diagnosis Date Noted   Portal vein thrombosis 04/04/2017   History of kidney cancer 10/15/2016   BMI 34.0-34.9,adult 08/25/2016   Type 2 diabetes mellitus without complications 06/28/2015   Essential hypertension 09/22/2012   Varicose vein of leg 09/22/2012   Home Medication(s) Prior to Admission medications   Medication Sig Start Date End Date Taking? Authorizing Provider  ciprofloxacin  (CIPRO) 500 MG tablet Take 1 tablet (500 mg total) by mouth every 12 (twelve) hours for 5 days. 01/04/23 01/09/23 Yes Frida Wahlstrom, Amadeo Garnet, MD  Hyoscyamine Sulfate SL (LEVSIN/SL) 0.125 MG SUBL Place 1 tablet (0.125 mg total) under the tongue 4 (four) times daily as needed for up to 5 days. 01/01/23 01/06/23 Yes Jora Galluzzo, Amadeo Garnet, MD  ondansetron (ZOFRAN-ODT) 4 MG disintegrating tablet Take 1 tablet (4 mg total) by mouth every 8 (eight) hours as needed for up to 3 days for nausea or vomiting. 01/01/23 01/04/23 Yes Tasmia Blumer, Amadeo Garnet, MD  aspirin EC 81 MG tablet Take 81 mg by mouth daily as needed.    [provider]  blood glucose meter kit and supplies KIT Dispense based on patient and insurance preference. Use up to three times daily as directed. 12/16/20   Nafziger, Kandee Keen, NP  Coenzyme Q10 (COQ10 PO) Take 1 tablet by mouth at bedtime.    [provider]  EPINEPHrine 0.3 mg/0.3 mL IJ SOAJ injection Inject 0.3 mg into the muscle as needed. 09/08/21   [provider]  famotidine (PEPCID) 20 MG tablet Take 20 mg by mouth daily as needed. 08/28/21   [provider]  GELSYN-3 16.8 MG/2ML SOSY intra-articular injection  03/12/22   [provider]  hydrochlorothiazide (MICROZIDE) 12.5 MG capsule TAKE 1 CAPSULE(12.5 MG) BY MOUTH DAILY 06/13/22   Nafziger, Kandee Keen, NP  metFORMIN (GLUCOPHAGE) 500 MG tablet TAKE 1 TABLET(500 MG) BY MOUTH TWICE DAILY WITH  A MEAL 09/10/22   Nafziger, Kandee Keen, NP  Multiple Vitamin (MULTIVITAMIN WITH MINERALS) TABS Take 1 tablet by mouth daily.    [provider]  NIACIN PO Take by mouth daily.    [provider]  OVER THE COUNTER MEDICATION Take by mouth daily. Tribulus testosterone supplement    [provider]  oxymetazoline (AFRIN) 0.05 % nasal spray Place 1 spray into both nostrils 2 (two) times daily as needed (nose bleeds).    [provider]  PRESCRIPTION MEDICATION PER PT ALLERGY INJECTION WKLY AND  TWICE PER MONTH IN SUMMER    [provider]  saw palmetto 500 MG capsule Take 500 mg by mouth 2 (two) times daily.    [provider]  simvastatin (ZOCOR) 5 MG tablet TAKE 1 TABLET(5 MG) BY MOUTH DAILY 12/05/22   Nafziger, Kandee Keen, NP  sodium chloride (OCEAN) 0.65 % SOLN nasal spray Place 1 spray into both nostrils as needed for congestion.    [provider]  triamcinolone (NASACORT) 55 MCG/ACT AERO nasal inhaler Place 2 sprays into the nose daily. 2 sprays each nostril at night 03/07/21 11/29/21  Drema Halon, MD  TURMERIC PO Take 1 tablet by mouth 2 (two) times daily.    [provider]                                                                                                                                    Allergies Other and Penicillins  Review of Systems Review of Systems  Constitutional:  Negative for fatigue and fever.  Gastrointestinal:  Positive for abdominal pain, diarrhea, nausea and vomiting. Negative for hematemesis, hematochezia and melena.  Genitourinary:  Negative for dysuria and hematuria.   As noted in HPI  Physical Exam Vital Signs  I have reviewed the triage vital signs BP (!) 145/85   Pulse (!) 105   Temp 99.1 F (37.3 C) (Oral)   Resp 15   Ht 5\' 11"  (1.803 m)   Wt 113.4 kg   SpO2 96%   BMI 34.87 kg/m   Physical Exam Vitals reviewed.  Constitutional:      General: He is not in acute distress.    Appearance: He is well-developed. He is not diaphoretic.  HENT:     Head: Normocephalic and atraumatic.     Right Ear: External ear normal.     Left Ear: External ear normal.     Nose: Nose normal.     Mouth/Throat:     Mouth: Mucous membranes are moist.  Eyes:     General: No scleral icterus.    Conjunctiva/sclera: Conjunctivae normal.  Neck:     Trachea: Phonation normal.  Cardiovascular:     Rate and Rhythm: Normal rate and regular rhythm.  Pulmonary:     Effort: Pulmonary effort is normal. No  respiratory distress.     Breath sounds: No stridor.  Abdominal:  General: There is no distension.     Tenderness: There is generalized abdominal tenderness (mild). There is no guarding or rebound.  Musculoskeletal:        General: Normal range of motion.     Cervical back: Normal range of motion.  Neurological:     Mental Status: He is alert and oriented to person, place, and time.  Psychiatric:        Behavior: Behavior normal.     ED Results and Treatments Labs (all labs ordered are listed, but only abnormal results are displayed) Labs Reviewed  COMPREHENSIVE METABOLIC PANEL - Abnormal; Notable for the following components:      Result Value   Sodium 130 (*)    Potassium 3.1 (*)    CO2 18 (*)    Glucose, Bld 150 (*)    All other components within normal limits  CBC WITH DIFFERENTIAL/PLATELET - Abnormal; Notable for the following components:   RBC 5.99 (*)    Hemoglobin 17.1 (*)    Lymphs Abs 0.4 (*)    All other components within normal limits  LIPASE, BLOOD                                                                                                                         EKG  EKG Interpretation  Date/Time:    Ventricular Rate:    PR Interval:    QRS Duration:   QT Interval:    QTC Calculation:   R Axis:     Text Interpretation:         Radiology CT ABDOMEN PELVIS WO CONTRAST  Result Date: 12/31/2022 CLINICAL DATA:  Abdominal pain, acute, nonlocalized, nausea, vomiting, diarrhea EXAM: CT ABDOMEN AND PELVIS WITHOUT CONTRAST TECHNIQUE: Multidetector CT imaging of the abdomen and pelvis was performed following the standard protocol without IV contrast. RADIATION DOSE REDUCTION: This exam was performed according to the departmental dose-optimization program which includes automated exposure control, adjustment of the mA and/or kV according to patient size and/or use of iterative reconstruction technique. COMPARISON:  03/16/2022 FINDINGS: Lower chest: Moderate  coronary artery calcification. Global cardiac size within normal limits. Visualized lung bases are clear. Hepatobiliary: No focal liver abnormality is seen. No gallstones, gallbladder wall thickening, or biliary dilatation. Pancreas: Unremarkable Spleen: Unremarkable Adrenals/Urinary Tract: Stable 18 mm nodule within the left adrenal gland demonstrating a density of 17 Hounsfield units compatible with a benign adrenal adenoma. No follow-up imaging is recommended. Right adrenal gland is unremarkable. Partial resection of the upper pole the right kidney noted. The kidneys are otherwise unremarkable on this noncontrast examination. Bladder unremarkable. Stomach/Bowel: Moderate sigmoid colonic diverticulosis without superimposed acute inflammatory change. The colon is diffusely fluid-filled, a finding that can be seen the setting of enterocolitis and may present clinically as diarrhea. The stomach, small bowel, and large bowel are otherwise unremarkable. No evidence of obstruction. Appendix normal. No free intraperitoneal gas or fluid. Vascular/Lymphatic: Aortic atherosclerosis. No enlarged abdominal or pelvic lymph nodes. Reproductive: Moderate prostatic hypertrophy.  Other: Tiny fat containing umbilical hernia. Ventral hernia repair with mesh has been performed without recurrent hernia in this location. Musculoskeletal: Osseous structures are age-appropriate. No acute bone abnormality. No lytic or blastic bone lesion. IMPRESSION: 1. Diffusely fluid-filled colon, a finding that can be seen the setting of enterocolitis and may present clinically as diarrhea. No evidence of obstruction. 2. Moderate coronary artery calcification. 3. Stable 18 mm benign left adrenal adenoma. 4. Moderate sigmoid colonic diverticulosis without superimposed acute inflammatory change. 5. Moderate prostatic hypertrophy. Aortic Atherosclerosis (ICD10-I70.0). Electronically Signed   By: Helyn Numbers M.D.   On: 12/31/2022 21:08    Medications  Ordered in ED Medications  HYDROmorphone (DILAUDID) injection 0.5 mg (0.5 mg Intravenous Given 12/31/22 2339)  sodium chloride 0.9 % bolus 1,000 mL (0 mLs Intravenous Stopped 01/01/23 0101)  ketorolac (TORADOL) 15 MG/ML injection 15 mg (15 mg Intravenous Given 12/31/22 2338)  hyoscyamine (LEVSIN SL) SL tablet 0.125 mg (0.125 mg Oral Given 12/31/22 2329)                                                                                                                                     Procedures Procedures  (including critical care time)  Medical Decision Making / ED Course  Click here for ABCD2, HEART and other calculators  Medical Decision Making Amount and/or Complexity of Data Reviewed Labs: ordered. Decision-making details documented in ED Course. Radiology: ordered and independent interpretation performed. Decision-making details documented in ED Course.  Risk Prescription drug management. Parenteral controlled substances.    This patient presents to the ED for concern of abd pain, N/V/D, this involves an extensive number of treatment options, and is a complaint that carries with it a high risk of complications and morbidity. The differential diagnosis includes but not limited to gastroenteritis, enterocolitis, diverticulitis, or other intra-abdominal inflammatory/infectious processes; doubt SBO.  Initial intervention:  IVF Toradol Dilaudid Levsin  Work up: (Labs and/or imaging independently interpreted by me) CBC without leukocytosis or anemia. CMP with mild hyponatremia and hypokalemia secondary to GI losses.  No other significant electrolyte derangements or renal insufficiency.  No evidence of bili obstruction or pancreatitis. CT with fluid-filled bowel consistent with enterocolitis without obvious inflammatory changes.  No bowel obstruction.  Reassessment: After reassessment, patient reported feeling significantly better. No need for admission and appropriate for  outpatient management. Will Rx Levsin and Zofran. Deferred prescription for ciprofloxacin if symptoms do not improve in 3 to 5 days.       Final Clinical Impression(s) / ED Diagnoses Final diagnoses:  Nausea vomiting and diarrhea   The patient appears reasonably screened and/or stabilized for discharge and I doubt any other medical condition or other [facility] requiring further screening, evaluation, or treatment in the ED at this time. I have discussed the findings, Dx and Tx plan with the patient/family who expressed understanding and agree(s) with the plan. Discharge instructions discussed at length. The patient/family was given  strict return precautions who verbalized understanding of the instructions. No further questions at time of discharge.  Disposition: Discharge  Condition: Good  ED Discharge Orders          Ordered    ciprofloxacin (CIPRO) 500 MG tablet  Every 12 hours        01/01/23 0049    Hyoscyamine Sulfate SL (LEVSIN/SL) 0.125 MG SUBL  4 times daily PRN        01/01/23 0049    ondansetron (ZOFRAN-ODT) 4 MG disintegrating tablet  Every 8 hours PRN        01/01/23 0049             Follow Up: Shirline Frees, NP [address] [place] [address] [phone]  Call  if symptoms do not improve or  worsen, in 3-5 days           This chart was dictated using voice recognition software.  Despite best efforts to proofread,  errors can occur which can change the documentation meaning.    Nira Conn, MD 01/01/23 [phone]

## 2023-01-11 ENCOUNTER — Encounter: Payer: Self-pay | Admitting: Adult Health

## 2023-01-11 ENCOUNTER — Ambulatory Visit: Payer: No Typology Code available for payment source | Admitting: Adult Health

## 2023-01-11 VITALS — BP 100/62 | HR 60 | Temp 98.2°F | Ht 71.0 in | Wt 244.0 lb

## 2023-01-11 DIAGNOSIS — K529 Noninfective gastroenteritis and colitis, unspecified: Secondary | ICD-10-CM

## 2023-01-11 MED ORDER — CIPROFLOXACIN HCL 500 MG PO TABS
500.0000 mg | ORAL_TABLET | Freq: Two times a day (BID) | ORAL | 0 refills | Status: AC
Start: 2023-01-11 — End: 2023-01-21

## 2023-01-11 NOTE — Progress Notes (Signed)
Subjective:    Patient ID: Michael Church, male    DOB: 04-12-1956, 67 y.o.   MRN: [id number]  HPI  67 year old male who  has a past medical history of Chronic rhinitis, GERD (gastroesophageal reflux disease), Herniated lumbar intervertebral disc, History of epistaxis, History of kidney stones, History of renal cell carcinoma (08/2016), Hypertension, Incisional hernia, Nocturia, OA (osteoarthritis), Seasonal allergies, Type 2 diabetes mellitus (HCC), and Varicose vein of leg.  He presents to the office today for follow-up regarding emergency room visit for abdominal pain.  He was seen roughly 11 days ago [facility] for generalized cramping abdominal pain that was worsened with eating and bowel movements.  He did have associated diarrhea, nausea, and vomiting.  He did not have any dysuria, fatigue, fever, blood in stool or vomit  His workup including CBC CMP did not show leukocytosis or anemia.  He had mild hyponatremia and hypokalemia secondary to GI loss.  CT showed fluid-filled bowel consistent with enterocolitis without obvious inflammatory changes.  He was prescribed Levsin and Zofran and had deferred prescription for Cipro if symptoms do not improve within 3 to 5 days.  Today he reports that he continues to have abdominal pain and cramping that happens when he eats ( worse in the left lower quadrant now). He is no longer having nausea. Has not had diarrhea but stool has ben soft.  He has had some " hot flashes and then will get the chills".  He did not take the antibiotics  Review of Systems See HPI   Past Medical History:  Diagnosis Date   Chronic rhinitis    followed by dr Irena Cords   GERD (gastroesophageal reflux disease)    Herniated lumbar intervertebral disc    not a problem presently   History of epistaxis    History of kidney stones    History of renal cell carcinoma 08/2016   urologist--- dr Marlou Porch;   s/p right partial nephrecotmy for papillary RCC, no chemo /  radiation  (previously seen by oncology--- dr Bertis Ruddy, lov note in epic 04-04-2017)   Hypertension    Incisional hernia    Nocturia    OA (osteoarthritis)    knees   Seasonal allergies    Type 2 diabetes mellitus (HCC)    followed by pcp---  (11-27-2021  per pt check blood sugar twice daily, fasting sugar---112--140)   Varicose vein of leg     Social History   Socioeconomic History   Marital status: Married    Spouse name: Sue Lush   Number of children: 2   Years of education: Not on file   Highest education level: Bachelor's degree (e.g., BA, AB, BS)  Occupational History   Occupation: Code and compliance    Employer: [employer]  Tobacco Use   Smoking status: Some Days    Types: Cigars   Smokeless tobacco: Never   Tobacco comments:    11-27-2021  Per pt average one cigar every 2-3 months  Vaping Use   Vaping Use: Never used  Substance and Sexual Activity   Alcohol use: Not Currently    Comment: occasional   Drug use: No   Sexual activity: Yes  Other Topics Concern   Not on file  Social History Narrative   Retired from post office    Works part time for the [employer]    Social Determinants of [employer]: Low Risk  (03/26/2022)   Overall Physicist, medical Strain (  CARDIA)    Difficulty of Paying Living Expenses: Not hard at all  Food Insecurity: No Food Insecurity (03/26/2022)   Hunger Vital Sign    Worried About Running Out of Food in the Last Year: Never true    Ran Out of Food in the Last Year: Never true  Transportation Needs: No Transportation Needs (03/26/2022)   PRAPARE - Administrator, Civil Service (Medical): No    Lack of Transportation (Non-Medical): No  Physical Activity: Insufficiently Active (03/26/2022)   Exercise Vital Sign    Days of Exercise per Week: 4 days    Minutes of Exercise per Session: 20 min  Stress: Patient Declined (03/26/2022)   [employer] of Occupational Health - Occupational  Stress Questionnaire    Feeling of Stress : Patient declined  Social Connections: Unknown (03/26/2022)   Social Connection and Isolation Panel [NHANES]    Frequency of Communication with Friends and Family: Once a week    Frequency of Social Gatherings with Friends and Family: Patient declined    Attends Religious Services: Patient declined    Database administrator or Organizations: Yes    Attends Banker Meetings: Patient declined    Marital Status: Married  Catering manager Violence: Not on file    Past Surgical History:  Procedure Laterality Date   CYSTECTOMY     per pt as child had a cyst removed that had was deep in chest wall   EXCISION MASS NECK  10/2011   posterior neck   INCISIONAL HERNIA REPAIR N/A 11/29/2021   Procedure: OPEN INCISIONAL HERNIA REPAIR WITH MESH;  Surgeon: Abigail Miyamoto, MD;  Location: [facility];  Service: General;  Laterality: N/A;   KNEE ARTHROSCOPY W/ MENISCAL REPAIR Bilateral 03/26/2011   @[facility]  by dr [name],  left knee//    right knee in 2017   MASS EXCISION N/A 09/11/2017   Procedure: MINOR EXCISION OF BACK MASS, EXCISION OF GLUTEAL MASS;  Surgeon: Axel Filler, MD;  Location: [facility];  Service: General;  Laterality: N/A;   ROBOTIC ASSITED PARTIAL NEPHRECTOMY Right 09/07/2016   Procedure: XI ROBOTIC ASSITED LAPAROSCOPIC RIGHT PARTIAL NEPHRECTOMY;  Surgeon: Crist Fat, MD;  Location: [facility];  Service: Urology;  Laterality: Right;   TONSILLECTOMY     child   VARICOSE VEIN SURGERY Bilateral 01/2013   per pt stripping    Family History  Problem Relation Age of Onset   Stroke Father        age 62   Cancer Brother        Hodgkin   HIV/AIDS Brother        age 51    Colon cancer Neg Hx    Esophageal cancer Neg Hx    Rectal cancer Neg Hx    Stomach cancer Neg Hx     Allergies  Allergen Reactions   Other Itching    Foods - cantaloupes, tomatoes, bananas, and  raw carrots.   Penicillins Itching    Has  patient had a PCN reaction causing immediate rash, facial/tongue/throat swelling, SOB or lightheadedness with hypotension: No Has patient had a PCN reaction causing severe rash involving mucus membranes or skin necrosis: No Has patient had a PCN reaction that required hospitalization: No Has patient had a PCN reaction occurring within the last 10 years: No If all of the above answers are "NO", then may proceed with Cephalosporin use.     Current Outpatient Medications on File Prior to Visit  Medication Sig  Dispense Refill   aspirin EC 81 MG tablet Take 81 mg by mouth daily as needed.     blood glucose meter kit and supplies KIT Dispense based on patient and insurance preference. Use up to three times daily as directed. 1 each 0   Coenzyme Q10 (COQ10 PO) Take 1 tablet by mouth at bedtime.     EPINEPHrine 0.3 mg/0.3 mL IJ SOAJ injection Inject 0.3 mg into the muscle as needed.     famotidine (PEPCID) 20 MG tablet Take 20 mg by mouth daily as needed.     GELSYN-3 16.8 MG/2ML SOSY intra-articular injection      hydrochlorothiazide (MICROZIDE) 12.5 MG capsule TAKE 1 CAPSULE(12.5 MG) BY MOUTH DAILY 90 capsule 3   metFORMIN (GLUCOPHAGE) 500 MG tablet TAKE 1 TABLET(500 MG) BY MOUTH TWICE DAILY WITH A MEAL 180 tablet 1   Multiple Vitamin (MULTIVITAMIN WITH MINERALS) TABS Take 1 tablet by mouth daily.     NIACIN PO Take by mouth daily.     OVER THE COUNTER MEDICATION Take by mouth daily. Tribulus testosterone supplement     oxymetazoline (AFRIN) 0.05 % nasal spray Place 1 spray into both nostrils 2 (two) times daily as needed (nose bleeds).     PRESCRIPTION MEDICATION PER PT ALLERGY INJECTION WKLY AND TWICE PER MONTH IN SUMMER     saw palmetto 500 MG capsule Take 500 mg by mouth 2 (two) times daily.     simvastatin (ZOCOR) 5 MG tablet TAKE 1 TABLET(5 MG) BY MOUTH DAILY 90 tablet 2   sodium chloride (OCEAN) 0.65 % SOLN nasal spray Place 1 spray into both nostrils as needed for congestion.     TURMERIC  PO Take 1 tablet by mouth 2 (two) times daily.     Hyoscyamine Sulfate SL (LEVSIN/SL) 0.125 MG SUBL Place 1 tablet (0.125 mg total) under the tongue 4 (four) times daily as needed for up to 5 days. 30 tablet 0   triamcinolone (NASACORT) 55 MCG/ACT AERO nasal inhaler Place 2 sprays into the nose daily. 2 sprays each nostril at night 1 each 12   No current facility-administered medications on file prior to visit.    BP 100/62   Pulse 60   Temp 98.2 F (36.8 C) (Oral)   Ht 5\' 11"  (1.803 m)   Wt 244 lb (110.7 kg)   SpO2 98%   BMI 34.03 kg/m       Objective:   Physical Exam Vitals and nursing note reviewed.  Constitutional:      Appearance: Normal appearance.  Cardiovascular:     Rate and Rhythm: Normal rate and regular rhythm.     Pulses: Normal pulses.     Heart sounds: Normal heart sounds.  Pulmonary:     Effort: Pulmonary effort is normal.     Breath sounds: Normal breath sounds.  Abdominal:     General: Abdomen is flat. Bowel sounds are increased.     Palpations: Abdomen is soft.     Tenderness: There is abdominal tenderness in the right upper quadrant, epigastric area, periumbilical area, left upper quadrant and left lower quadrant.     Hernia: No hernia is present.  Musculoskeletal:        General: Normal range of motion.  Skin:    General: Skin is warm and dry.     Capillary Refill: Capillary refill takes less than 2 seconds.  Neurological:     General: No focal deficit present.     Mental Status: He is alert and  oriented to person, place, and time.  Psychiatric:        Mood and Affect: Mood normal.        Behavior: Behavior normal.        Thought Content: Thought content normal.        Judgment: Judgment normal.       Assessment & Plan:  1. Enterocolitis - Will send in Cipro for him since he did not take the prescription from the ER.  - Follow up if not resolved after antibiotics  - ciprofloxacin (CIPRO) 500 MG tablet; Take 1 tablet (500 mg total) by mouth  2 (two) times daily for 10 days.  Dispense: 20 tablet; Refill: 0  Shirline Frees, NP  Time spent with patient today was 31 minutes which consisted of chart review, discussing abdominal pain and enteritis,  work up, treatment answering questions and documentation.

## 2023-01-24 ENCOUNTER — Telehealth: Payer: Self-pay | Admitting: Adult Health

## 2023-01-24 NOTE — Telephone Encounter (Signed)
Patient came in and stated that his insurance will cover Dexcom, can you please send an order for this to [facility] [phone].  Thank you

## 2023-02-05 LAB — HM DIABETES EYE EXAM

## 2023-02-07 ENCOUNTER — Encounter: Payer: Self-pay | Admitting: Adult Health

## 2023-02-08 ENCOUNTER — Other Ambulatory Visit: Payer: Self-pay | Admitting: Adult Health

## 2023-02-08 MED ORDER — FREESTYLE LIBRE 3 SENSOR MISC: 1.0000 | 3 refills | Status: DC

## 2023-02-12 NOTE — Telephone Encounter (Signed)
PA sent to Covermymeds.   Michael Church (KeyVella Raring) PA Case ID #: [id number]

## 2023-02-14 ENCOUNTER — Encounter: Payer: Self-pay | Admitting: Adult Health

## 2023-02-14 NOTE — Telephone Encounter (Signed)
Pt call and made the appt .

## 2023-02-19 ENCOUNTER — Encounter: Payer: Self-pay | Admitting: Adult Health

## 2023-02-19 ENCOUNTER — Ambulatory Visit (INDEPENDENT_AMBULATORY_CARE_PROVIDER_SITE_OTHER): Payer: No Typology Code available for payment source | Admitting: Adult Health

## 2023-02-19 VITALS — BP 150/80 | HR 82 | Temp 98.0°F | Ht 71.0 in | Wt 248.0 lb

## 2023-02-19 DIAGNOSIS — I10 Essential (primary) hypertension: Secondary | ICD-10-CM

## 2023-02-19 DIAGNOSIS — E119 Type 2 diabetes mellitus without complications: Secondary | ICD-10-CM

## 2023-02-19 DIAGNOSIS — Z7984 Long term (current) use of oral hypoglycemic drugs: Secondary | ICD-10-CM | POA: Diagnosis not present

## 2023-02-19 LAB — POCT GLYCOSYLATED HEMOGLOBIN (HGB A1C): Hemoglobin A1C: 6.7 % — AB (ref 4.0–5.6)

## 2023-02-19 MED ORDER — METFORMIN HCL 1000 MG PO TABS
1000.0000 mg | ORAL_TABLET | Freq: Two times a day (BID) | ORAL | 3 refills | Status: DC
Start: 2023-02-19 — End: 2023-08-06

## 2023-02-19 NOTE — Progress Notes (Signed)
Subjective:    Patient ID: Michael Church, male    DOB: 09-18-1955, 67 y.o.   MRN: [id number]  HPI  67 year old male who  has a past medical history of Chronic rhinitis, GERD (gastroesophageal reflux disease), Herniated lumbar intervertebral disc, History of epistaxis, History of kidney stones, History of renal cell carcinoma (08/2016), Hypertension, Incisional hernia, Nocturia, OA (osteoarthritis), Seasonal allergies, Type 2 diabetes mellitus (HCC), and Varicose vein of leg.  He presents to the office today for follow up regarding DM and HTN   Diabetes mellitus type 2-managed with metformin 500 mg twice daily.  He had been using the [place] 3 sensor and noted that his average BS was 140.Marland Kitchen He was given samples and he likes to be able to see what his blood sugars would do with the foods he was eating. Unfortunately his insurance does not cover Josephine Igo and want him to have him Dexcom but even with insurance coverage his Dexcom is more expensive  Lab Results  Component Value Date   HGBA1C 6.7 (H) 03/27/2022   Hypertension-managed with HCTZ 12.5 mg daily.  He denies dizziness, lightheadedness, chest pain, shortness of breath, or syncopal episodes BP Readings from Last 3 Encounters:  02/19/23 (!) 150/80  01/11/23 100/62  12/31/22 (!) 142/87    Review of Systems See HPI   Past Medical History:  Diagnosis Date   Chronic rhinitis    followed by dr Irena Cords   GERD (gastroesophageal reflux disease)    Herniated lumbar intervertebral disc    not a problem presently   History of epistaxis    History of kidney stones    History of renal cell carcinoma 08/2016   urologist--- dr Marlou Porch;   s/p right partial nephrecotmy for papillary RCC, no chemo / radiation  (previously seen by oncology--- dr Bertis Ruddy, lov note in epic 04-04-2017)   Hypertension    Incisional hernia    Nocturia    OA (osteoarthritis)    knees   Seasonal allergies    Type 2 diabetes mellitus (HCC)    followed by pcp---   (11-27-2021  per pt check blood sugar twice daily, fasting sugar---112--140)   Varicose vein of leg     Social History   Socioeconomic History   Marital status: Married    Spouse name: Sue Lush   Number of children: 2   Years of education: Not on file   Highest education level: Bachelor's degree (e.g., BA, AB, BS)  Occupational History   Occupation: Code and compliance    Employer: [employer]  Tobacco Use   Smoking status: Some Days    Types: Cigars   Smokeless tobacco: Never   Tobacco comments:    11-27-2021  Per pt average one cigar every 2-3 months  Vaping Use   Vaping Use: Never used  Substance and Sexual Activity   Alcohol use: Not Currently    Comment: occasional   Drug use: No   Sexual activity: Yes  Other Topics Concern   Not on file  Social History Narrative   Retired from post office    Works part time for the [employer]    Social Determinants of [employer]: Low Risk  (03/26/2022)   Overall Financial Resource Strain (CARDIA)    Difficulty of Paying Living Expenses: Not hard at all  Food Insecurity: No Food Insecurity (03/26/2022)   Hunger Vital Sign    Worried About Running Out of Food in the Last Year:  Never true    Ran Out of Food in the Last Year: Never true  Transportation Needs: No Transportation Needs (03/26/2022)   PRAPARE - Administrator, Civil Service (Medical): No    Lack of Transportation (Non-Medical): No  Physical Activity: Insufficiently Active (03/26/2022)   Exercise Vital Sign    Days of Exercise per Week: 4 days    Minutes of Exercise per Session: 20 min  Stress: Patient Declined (03/26/2022)   [employer] of Occupational Health - Occupational Stress Questionnaire    Feeling of Stress : Patient declined  Social Connections: Unknown (03/26/2022)   Social Connection and Isolation Panel [NHANES]    Frequency of Communication with Friends and Family: Once a week    Frequency of Social  Gatherings with Friends and Family: Patient declined    Attends Religious Services: Patient declined    Database administrator or Organizations: Yes    Attends Banker Meetings: Patient declined    Marital Status: Married  Catering manager Violence: Not on file    Past Surgical History:  Procedure Laterality Date   CYSTECTOMY     per pt as child had a cyst removed that had was deep in chest wall   EXCISION MASS NECK  10/2011   posterior neck   INCISIONAL HERNIA REPAIR N/A 11/29/2021   Procedure: OPEN INCISIONAL HERNIA REPAIR WITH MESH;  Surgeon: Abigail Miyamoto, MD;  Location: [facility] [place];  Service: General;  Laterality: N/A;   KNEE ARTHROSCOPY W/ MENISCAL REPAIR Bilateral 03/26/2011   @[facility]  by dr [name],  left knee//    right knee in 2017   MASS EXCISION N/A 09/11/2017   Procedure: MINOR EXCISION OF BACK MASS, EXCISION OF GLUTEAL MASS;  Surgeon: Axel Filler, MD;  Location: [facility];  Service: General;  Laterality: N/A;   ROBOTIC ASSITED PARTIAL NEPHRECTOMY Right 09/07/2016   Procedure: XI ROBOTIC ASSITED LAPAROSCOPIC RIGHT PARTIAL NEPHRECTOMY;  Surgeon: Crist Fat, MD;  Location: [facility];  Service: Urology;  Laterality: Right;   TONSILLECTOMY     child   VARICOSE VEIN SURGERY Bilateral 01/2013   per pt stripping    Family History  Problem Relation Age of Onset   Stroke Father        age 76   Cancer Brother        Hodgkin   HIV/AIDS Brother        age 42    Colon cancer Neg Hx    Esophageal cancer Neg Hx    Rectal cancer Neg Hx    Stomach cancer Neg Hx     Allergies  Allergen Reactions   Other Itching    Foods - cantaloupes, tomatoes, bananas, and  raw carrots.   Penicillins Itching    Has patient had a PCN reaction causing immediate rash, facial/tongue/throat swelling, SOB or lightheadedness with hypotension: No Has patient had a PCN reaction causing severe rash involving mucus membranes or skin necrosis: No Has patient had a PCN  reaction that required hospitalization: No Has patient had a PCN reaction occurring within the last 10 years: No If all of the above answers are "NO", then may proceed with Cephalosporin use.     Current Outpatient Medications on File Prior to Visit  Medication Sig Dispense Refill   aspirin EC 81 MG tablet Take 81 mg by mouth daily as needed.     blood glucose meter kit and supplies KIT Dispense based on patient and insurance preference. Use up to  three times daily as directed. 1 each 0   Coenzyme Q10 (COQ10 PO) Take 1 tablet by mouth at bedtime.     Continuous Glucose Sensor (FREESTYLE LIBRE 3 SENSOR) MISC 1 Device by Does not apply route every 14 (fourteen) days. Place 1 sensor on the skin every 14 days. Use to check glucose continuously 6 each 3   EPINEPHrine 0.3 mg/0.3 mL IJ SOAJ injection Inject 0.3 mg into the muscle as needed.     famotidine (PEPCID) 20 MG tablet Take 20 mg by mouth daily as needed.     GELSYN-3 16.8 MG/2ML SOSY intra-articular injection      hydrochlorothiazide (MICROZIDE) 12.5 MG capsule TAKE 1 CAPSULE(12.5 MG) BY MOUTH DAILY 90 capsule 3   Hyoscyamine Sulfate SL (LEVSIN/SL) 0.125 MG SUBL Place 1 tablet (0.125 mg total) under the tongue 4 (four) times daily as needed for up to 5 days. 30 tablet 0   metFORMIN (GLUCOPHAGE) 500 MG tablet TAKE 1 TABLET(500 MG) BY MOUTH TWICE DAILY WITH A MEAL 180 tablet 1   Multiple Vitamin (MULTIVITAMIN WITH MINERALS) TABS Take 1 tablet by mouth daily.     NIACIN PO Take by mouth daily.     OVER THE COUNTER MEDICATION Take by mouth daily. Tribulus testosterone supplement     oxymetazoline (AFRIN) 0.05 % nasal spray Place 1 spray into both nostrils 2 (two) times daily as needed (nose bleeds).     PRESCRIPTION MEDICATION PER PT ALLERGY INJECTION WKLY AND TWICE PER MONTH IN SUMMER     saw palmetto 500 MG capsule Take 500 mg by mouth 2 (two) times daily.     simvastatin (ZOCOR) 5 MG tablet TAKE 1 TABLET(5 MG) BY MOUTH DAILY 90 tablet 2    sodium chloride (OCEAN) 0.65 % SOLN nasal spray Place 1 spray into both nostrils as needed for congestion.     tamsulosin (FLOMAX) 0.4 MG CAPS capsule Take 0.4 mg by mouth daily.     TURMERIC PO Take 1 tablet by mouth 2 (two) times daily.     triamcinolone (NASACORT) 55 MCG/ACT AERO nasal inhaler Place 2 sprays into the nose daily. 2 sprays each nostril at night 1 each 12   No current facility-administered medications on file prior to visit.    BP (!) 150/80   Pulse 82   Temp 98 F (36.7 C) (Oral)   Ht 5\' 11"  (1.803 m)   Wt 248 lb (112.5 kg)   SpO2 96%   BMI 34.59 kg/m       Objective:   Physical Exam Vitals and nursing note reviewed.  Constitutional:      Appearance: Normal appearance.  Cardiovascular:     Rate and Rhythm: Normal rate and regular rhythm.     Pulses: Normal pulses.     Heart sounds: Normal heart sounds.  Pulmonary:     Effort: Pulmonary effort is normal.     Breath sounds: Normal breath sounds.  Neurological:     General: No focal deficit present.     Mental Status: He is alert and oriented to person, place, and time.  Psychiatric:        Mood and Affect: Mood normal.        Behavior: Behavior normal.        Thought Content: Thought content normal.       Assessment & Plan:  1. Type 2 diabetes mellitus without complication, without long-term current use of insulin (HCC)  - POC HgB A1c- 6.7 - has not changed  -  Will increase Metformin to 1000 mg BID  - Follow up in 3 months for CPE  - metFORMIN (GLUCOPHAGE) 1000 MG tablet; Take 1 tablet (1,000 mg total) by mouth 2 (two) times daily with a meal.  Dispense: 180 tablet; Refill: 3  2. Essential hypertension - BP elevated today. He took his medications just prior to arrival - Will have him monitor his BP at home and send me results in one week via mychart   Shirline Frees, NP

## 2023-02-21 ENCOUNTER — Other Ambulatory Visit: Payer: Self-pay | Admitting: Adult Health

## 2023-02-21 DIAGNOSIS — E119 Type 2 diabetes mellitus without complications: Secondary | ICD-10-CM

## 2023-02-21 NOTE — Telephone Encounter (Signed)
  The original prescription was discontinued on 02/19/2023 by Shirline Frees, NP. Renewing this prescription may not be appropriate.

## 2023-02-25 ENCOUNTER — Encounter: Payer: Self-pay | Admitting: Adult Health

## 2023-02-27 MED ORDER — DEXCOM G7 SENSOR MISC: 2 refills | Status: DC

## 2023-02-27 MED ORDER — DEXCOM G7 RECEIVER DEVI: 0 refills | Status: DC

## 2023-03-01 ENCOUNTER — Telehealth: Payer: Self-pay | Admitting: Adult Health

## 2023-03-01 NOTE — Telephone Encounter (Signed)
Spoke with pharmacist and updated the Rx from 1 to 3. No further actions needed.

## 2023-03-01 NOTE — Telephone Encounter (Signed)
Katie with [facility] called in and stated for pt's prescription Dexcom Sensor, it was called in for quantity of 1. However, she is trying to fill it for quantity of 3 and would like to see if that could be updated. Call back number: [phone]

## 2023-03-04 ENCOUNTER — Other Ambulatory Visit (HOSPITAL_COMMUNITY): Payer: Self-pay

## 2023-03-08 NOTE — Telephone Encounter (Signed)
FYI

## 2023-03-12 ENCOUNTER — Other Ambulatory Visit: Payer: Self-pay | Admitting: Adult Health

## 2023-03-12 MED ORDER — FREESTYLE LIBRE 3 SENSOR MISC: 1.0000 | 6 refills | Status: DC

## 2023-04-11 ENCOUNTER — Encounter: Payer: Self-pay | Admitting: Adult Health

## 2023-04-11 ENCOUNTER — Ambulatory Visit (INDEPENDENT_AMBULATORY_CARE_PROVIDER_SITE_OTHER): Payer: No Typology Code available for payment source | Admitting: Adult Health

## 2023-04-11 VITALS — BP 130/80 | HR 75 | Temp 98.3°F | Ht 70.75 in | Wt 251.0 lb

## 2023-04-11 DIAGNOSIS — E119 Type 2 diabetes mellitus without complications: Secondary | ICD-10-CM | POA: Diagnosis not present

## 2023-04-11 DIAGNOSIS — N400 Enlarged prostate without lower urinary tract symptoms: Secondary | ICD-10-CM

## 2023-04-11 DIAGNOSIS — Z6834 Body mass index (BMI) 34.0-34.9, adult: Secondary | ICD-10-CM | POA: Diagnosis not present

## 2023-04-11 DIAGNOSIS — I10 Essential (primary) hypertension: Secondary | ICD-10-CM | POA: Diagnosis not present

## 2023-04-11 DIAGNOSIS — E782 Mixed hyperlipidemia: Secondary | ICD-10-CM | POA: Diagnosis not present

## 2023-04-11 DIAGNOSIS — Z7984 Long term (current) use of oral hypoglycemic drugs: Secondary | ICD-10-CM | POA: Diagnosis not present

## 2023-04-11 DIAGNOSIS — Z0000 Encounter for general adult medical examination without abnormal findings: Secondary | ICD-10-CM | POA: Diagnosis not present

## 2023-04-11 LAB — COMPREHENSIVE METABOLIC PANEL
ALT: 23 U/L (ref 0–53)
AST: 18 U/L (ref 0–37)
Albumin: 4.5 g/dL (ref 3.5–5.2)
Alkaline Phosphatase: 42 U/L (ref 39–117)
BUN: 14 mg/dL (ref 6–23)
CO2: 31 mEq/L (ref 19–32)
Calcium: 9.7 mg/dL (ref 8.4–10.5)
Chloride: 98 mEq/L (ref 96–112)
Creatinine, Ser: 1.09 mg/dL (ref 0.40–1.50)
GFR: 71.41 mL/min (ref >60.00)
Glucose, Bld: 118 mg/dL — ABNORMAL HIGH (ref 70–99)
Potassium: 4.3 mEq/L (ref 3.5–5.1)
Sodium: 138 mEq/L (ref 135–145)
Total Bilirubin: 0.6 mg/dL (ref 0.2–1.2)
Total Protein: 7.2 g/dL (ref 6.0–8.3)

## 2023-04-11 LAB — TSH: TSH: 2.27 u[IU]/mL (ref 0.35–5.50)

## 2023-04-11 LAB — CBC
HCT: 46.8 % (ref 39.0–52.0)
Hemoglobin: 15.4 g/dL (ref 13.0–17.0)
MCHC: 33 g/dL (ref 30.0–36.0)
MCV: 86.9 fl (ref 78.0–100.0)
Platelets: 257 10*3/uL (ref 150.0–400.0)
RBC: 5.39 Mil/uL (ref 4.22–5.81)
RDW: 13.8 % (ref 11.5–15.5)
WBC: 4.2 10*3/uL (ref 4.0–10.5)

## 2023-04-11 LAB — LIPID PANEL
Cholesterol: 156 mg/dL (ref 0–200)
HDL: 64.5 mg/dL (ref >39.00)
LDL Cholesterol: 78 mg/dL (ref 0–99)
NonHDL: 91.89
Total CHOL/HDL Ratio: 2
Triglycerides: 71 mg/dL (ref 0.0–149.0)
VLDL: 14.2 mg/dL (ref 0.0–40.0)

## 2023-04-11 LAB — MICROALBUMIN / CREATININE URINE RATIO
Creatinine,U: 106.2 mg/dL
Microalb Creat Ratio: 1.2 mg/g (ref 0.0–30.0)
Microalb, Ur: 1.2 mg/dL (ref 0.0–1.9)

## 2023-04-11 LAB — PSA: PSA: 1.97 ng/mL (ref 0.10–4.00)

## 2023-04-11 MED ORDER — SIMVASTATIN 5 MG PO TABS: 5.0000 mg | ORAL_TABLET | Freq: Every day | ORAL | 3 refills | Status: DC

## 2023-04-11 NOTE — Patient Instructions (Signed)
It was great seeing you today   We will follow up with you regarding your lab work   Please let me know if you need anything   

## 2023-04-11 NOTE — Progress Notes (Signed)
Subjective:    Patient ID: Michael Church, male    DOB: 03/27/1956, 67 y.o.   MRN: [id number]  HPI Patient presents for yearly preventative medicine examination. He is a pleasant 67 year old male who  has a past medical history of Chronic rhinitis, GERD (gastroesophageal reflux disease), Herniated lumbar intervertebral disc, History of epistaxis, History of kidney stones, History of renal cell carcinoma (08/2016), Hypertension, Incisional hernia, Nocturia, OA (osteoarthritis), Seasonal allergies, Type 2 diabetes mellitus (HCC), and Varicose vein of leg.  HTN -managed with HCTZ 12.5 mg daily.  He denies dizziness, lightheadedness, chest pain, shortness of breath, or syncopal episodes BP Readings from Last 3 Encounters:  04/11/23 130/80  02/19/23 (!) 150/80  01/11/23 100/62   Diabetes mellitus type 2-managed with metformin 1000 mg twice daily.  He had been using the [place] 3 sensor to monitor his blood sugars. He has cut back on fast foods but continues to have trouble with sweets. He is cooking at home.   CONTINUOUS GLUCOSE MONITORING RECORD INTERPRETATION                 Dates of Recording:    Sensor description: freestyle libre    Results statistics:   CGM use % of time 91  Average  135  Time in range 89  % Time Above 180 10  % Time above 250 1  % Time Below target 0     Lab Results  Component Value Date   HGBA1C 6.7 (A) 02/19/2023   Hyperlipidemia - managed with simvastatin 5 mg daily  Lab Results  Component Value Date   CHOL 154 03/27/2022   HDL 54.10 03/27/2022   LDLCALC 80 03/27/2022   TRIG 101.0 03/27/2022   CHOLHDL 3 03/27/2022   BPH - managed by urology with Flomax 0.4 mg daily. Feels well controlled.   All immunizations and health maintenance protocols were reviewed with the patient and needed orders were placed.  Appropriate screening laboratory values were ordered for the patient including screening of hyperlipidemia, renal function and hepatic  function. If indicated by BPH, a PSA was ordered.  Medication reconciliation,  past medical history, social history, problem list and allergies were reviewed in detail with the patient  Goals were established with regard to weight loss, exercise, and  diet in compliance with medications. He is trying to eat healthy and exercise  Wt Readings from Last 3 Encounters:  04/11/23 251 lb (113.9 kg)  02/19/23 248 lb (112.5 kg)  01/11/23 244 lb (110.7 kg)    He is up to date on colon cancer screening.    Review of Systems  Constitutional: Negative.   HENT: Negative.    Eyes: Negative.   Respiratory: Negative.    Cardiovascular: Negative.   Gastrointestinal: Negative.   Endocrine: Negative.   Genitourinary: Negative.   Musculoskeletal: Negative.   Skin: Negative.   Allergic/Immunologic: Negative.   Neurological: Negative.   Hematological: Negative.   Psychiatric/Behavioral: Negative.    All other systems reviewed and are negative.  Past Medical History:  Diagnosis Date   Chronic rhinitis    followed by dr Irena Cords   GERD (gastroesophageal reflux disease)    Herniated lumbar intervertebral disc    not a problem presently   History of epistaxis    History of kidney stones    History of renal cell carcinoma 08/2016   urologist--- dr Marlou Porch;   s/p right partial nephrecotmy for papillary RCC, no chemo / radiation  (previously seen by oncology---  dr Bertis Ruddy, lov note in epic 04-04-2017)   Hypertension    Incisional hernia    Nocturia    OA (osteoarthritis)    knees   Seasonal allergies    Type 2 diabetes mellitus (HCC)    followed by pcp---  (11-27-2021  per pt check blood sugar twice daily, fasting sugar---112--140)   Varicose vein of leg     Social History   Socioeconomic History   Marital status: Married    Spouse name: Sue Lush   Number of children: 2   Years of education: Not on file   Highest education level: Bachelor's degree (e.g., BA, AB, BS)  Occupational  History   Occupation: Code and compliance    Employer: [employer]  Tobacco Use   Smoking status: Some Days    Types: Cigars   Smokeless tobacco: Never   Tobacco comments:    11-27-2021  Per pt average one cigar every 2-3 months  Vaping Use   Vaping status: Never Used  Substance and Sexual Activity   Alcohol use: Not Currently    Comment: occasional   Drug use: No   Sexual activity: Yes  Other Topics Concern   Not on file  Social History Narrative   Retired from post office    Works part time for the [employer]    Social Determinants of [employer]: Low Risk  (03/26/2022)   Overall Financial Resource Strain (CARDIA)    Difficulty of Paying Living Expenses: Not hard at all  Food Insecurity: No Food Insecurity (03/26/2022)   Hunger Vital Sign    Worried About Running Out of Food in the Last Year: Never true    Ran Out of Food in the Last Year: Never true  Transportation Needs: No Transportation Needs (03/26/2022)   PRAPARE - Administrator, Civil Service (Medical): No    Lack of Transportation (Non-Medical): No  Physical Activity: Insufficiently Active (03/26/2022)   Exercise Vital Sign    Days of Exercise per Week: 4 days    Minutes of Exercise per Session: 20 min  Stress: Patient Declined (03/26/2022)   [employer] of Occupational Health - Occupational Stress Questionnaire    Feeling of Stress : Patient declined  Social Connections: Unknown (03/26/2022)   Social Connection and Isolation Panel [NHANES]    Frequency of Communication with Friends and Family: Once a week    Frequency of Social Gatherings with Friends and Family: Patient declined    Attends Religious Services: Patient declined    Database administrator or Organizations: Yes    Attends Banker Meetings: Patient declined    Marital Status: Married  Catering manager Violence: Not on file    Past Surgical History:  Procedure Laterality Date    CYSTECTOMY     per pt as child had a cyst removed that had was deep in chest wall   EXCISION MASS NECK  10/2011   posterior neck   INCISIONAL HERNIA REPAIR N/A 11/29/2021   Procedure: OPEN INCISIONAL HERNIA REPAIR WITH MESH;  Surgeon: Abigail Miyamoto, MD;  Location: [facility];  Service: General;  Laterality: N/A;   KNEE ARTHROSCOPY W/ MENISCAL REPAIR Bilateral 03/26/2011   @[facility]  by dr [name],  left knee//    right knee in 2017   MASS EXCISION N/A 09/11/2017   Procedure: MINOR EXCISION OF BACK MASS, EXCISION OF GLUTEAL MASS;  Surgeon: Axel Filler, MD;  Location: [facility];  Service: General;  Laterality: N/A;   ROBOTIC ASSITED PARTIAL NEPHRECTOMY Right 09/07/2016   Procedure: XI ROBOTIC ASSITED LAPAROSCOPIC RIGHT PARTIAL NEPHRECTOMY;  Surgeon: Crist Fat, MD;  Location: [facility];  Service: Urology;  Laterality: Right;   TONSILLECTOMY     child   VARICOSE VEIN SURGERY Bilateral 01/2013   per pt stripping    Family History  Problem Relation Age of Onset   Stroke Father        age 65   Cancer Brother        Hodgkin   HIV/AIDS Brother        age 37    Colon cancer Neg Hx    Esophageal cancer Neg Hx    Rectal cancer Neg Hx    Stomach cancer Neg Hx     Allergies  Allergen Reactions   Other Itching    Foods - cantaloupes, tomatoes, bananas, and  raw carrots.   Penicillins Itching    Has patient had a PCN reaction causing immediate rash, facial/tongue/throat swelling, SOB or lightheadedness with hypotension: No Has patient had a PCN reaction causing severe rash involving mucus membranes or skin necrosis: No Has patient had a PCN reaction that required hospitalization: No Has patient had a PCN reaction occurring within the last 10 years: No If all of the above answers are "NO", then may proceed with Cephalosporin use.     Current Outpatient Medications on File Prior to Visit  Medication Sig Dispense Refill   blood glucose meter kit and supplies KIT  Dispense based on patient and insurance preference. Use up to three times daily as directed. 1 each 0   Coenzyme Q10 (COQ10 PO) Take 1 tablet by mouth at bedtime.     Continuous Glucose Sensor (FREESTYLE LIBRE 3 SENSOR) MISC 1 Device by Does not apply route every 14 (fourteen) days. Place 1 sensor on the skin every 14 days. Use to check glucose continuously 2 each 6   EPINEPHrine 0.3 mg/0.3 mL IJ SOAJ injection Inject 0.3 mg into the muscle as needed.     famotidine (PEPCID) 20 MG tablet Take 20 mg by mouth daily as needed.     GELSYN-3 16.8 MG/2ML SOSY intra-articular injection      hydrochlorothiazide (MICROZIDE) 12.5 MG capsule TAKE 1 CAPSULE(12.5 MG) BY MOUTH DAILY 90 capsule 3   metFORMIN (GLUCOPHAGE) 1000 MG tablet Take 1 tablet (1,000 mg total) by mouth 2 (two) times daily with a meal. 180 tablet 3   Multiple Vitamin (MULTIVITAMIN WITH MINERALS) TABS Take 1 tablet by mouth daily.     OVER THE COUNTER MEDICATION Take by mouth daily. Tribulus testosterone supplement     oxymetazoline (AFRIN) 0.05 % nasal spray Place 1 spray into both nostrils 2 (two) times daily as needed (nose bleeds).     PRESCRIPTION MEDICATION PER PT ALLERGY INJECTION WKLY AND TWICE PER MONTH IN SUMMER     simvastatin (ZOCOR) 5 MG tablet TAKE 1 TABLET(5 MG) BY MOUTH DAILY 90 tablet 2   sodium chloride (OCEAN) 0.65 % SOLN nasal spray Place 1 spray into both nostrils as needed for congestion.     tamsulosin (FLOMAX) 0.4 MG CAPS capsule Take 0.4 mg by mouth daily.     triamcinolone (NASACORT) 55 MCG/ACT AERO nasal inhaler Place 2 sprays into the nose daily. 2 sprays each nostril at night 1 each 12   TURMERIC PO Take 1 tablet by mouth 2 (two) times daily.     No current facility-administered medications on file prior to visit.  BP 130/80   Pulse 75   Temp 98.3 F (36.8 C) (Oral)   Ht 5' 10.75" (1.797 m)   Wt 251 lb (113.9 kg)   SpO2 97%   BMI 35.26 kg/m       Objective:   Physical Exam Vitals and nursing  note reviewed.  Constitutional:      General: He is not in acute distress.    Appearance: Normal appearance. He is obese. He is not ill-appearing.  HENT:     Head: Normocephalic and atraumatic.     Right Ear: Tympanic membrane, ear canal and external ear normal. There is no impacted cerumen.     Left Ear: Tympanic membrane, ear canal and external ear normal. There is no impacted cerumen.     Nose: Nose normal. No congestion or rhinorrhea.     Mouth/Throat:     Mouth: Mucous membranes are moist.     Pharynx: Oropharynx is clear.  Eyes:     Extraocular Movements: Extraocular movements intact.     Conjunctiva/sclera: Conjunctivae normal.     Pupils: Pupils are equal, round, and reactive to light.  Neck:     Vascular: No carotid bruit.  Cardiovascular:     Rate and Rhythm: Normal rate and regular rhythm.     Pulses: Normal pulses.     Heart sounds: No murmur heard.    No friction rub. No gallop.  Pulmonary:     Effort: Pulmonary effort is normal.     Breath sounds: Normal breath sounds.  Abdominal:     General: Abdomen is flat. Bowel sounds are normal. There is no distension.     Palpations: Abdomen is soft. There is no mass.     Tenderness: There is no abdominal tenderness. There is no guarding or rebound.     Hernia: No hernia is present.  Musculoskeletal:        General: Normal range of motion.     Cervical back: Normal range of motion and neck supple.  Lymphadenopathy:     Cervical: No cervical adenopathy.  Skin:    General: Skin is warm and dry.     Capillary Refill: Capillary refill takes less than 2 seconds.  Neurological:     General: No focal deficit present.     Mental Status: He is alert and oriented to person, place, and time.  Psychiatric:        Mood and Affect: Mood normal.        Behavior: Behavior normal.        Thought Content: Thought content normal.        Judgment: Judgment normal.       Assessment & Plan:  1. Routine general medical examination at  a health care facility Today patient counseled on age appropriate routine health concerns for screening and prevention, each reviewed and up to date or declined. Immunizations reviewed and up to date or declined. Labs ordered and reviewed. Risk factors for depression reviewed and negative. Hearing function and visual acuity are intact. ADLs screened and addressed as needed. Functional ability and level of safety reviewed and appropriate. Education, counseling and referrals performed based on assessed risks today. Patient provided with a copy of personalized plan for preventive services.   2. Diabetes mellitus treated with oral medication (HCC) - At goal and improving. Will have him follow up in 6 months  - Lipid panel; Future - TSH; Future - CBC; Future - Comprehensive metabolic panel; Future - Microalbumin/Creatinine Ratio, Urine; Future  3.  Essential hypertension - Well controlled. No change in medication  - Lipid panel; Future - TSH; Future - CBC; Future - Comprehensive metabolic panel; Future  4. Mixed hyperlipidemia - Continue with Simvastatin 5 mg   - Lipid panel; Future - TSH; Future - CBC; Future - Comprehensive metabolic panel; Future - simvastatin (ZOCOR) 5 MG tablet; Take 1 tablet (5 mg total) by mouth at bedtime.  Dispense: 90 tablet; Refill: 3  5. Benign prostatic hyperplasia without lower urinary tract symptoms  - PSA; Future  6. BMI 34.0-34.9,adult - Continue to work on diet and exercise  - Lipid panel; Future - TSH; Future - CBC; Future - Comprehensive metabolic panel; Future   Shirline Frees, NP

## 2023-05-22 ENCOUNTER — Ambulatory Visit: Payer: No Typology Code available for payment source

## 2023-06-18 ENCOUNTER — Encounter: Payer: Self-pay | Admitting: Adult Health

## 2023-06-18 ENCOUNTER — Ambulatory Visit: Payer: No Typology Code available for payment source | Admitting: Adult Health

## 2023-06-18 VITALS — BP 130/82 | HR 73 | Temp 98.5°F | Ht 70.5 in | Wt 258.0 lb

## 2023-06-18 DIAGNOSIS — R058 Other specified cough: Secondary | ICD-10-CM

## 2023-06-18 DIAGNOSIS — Z23 Encounter for immunization: Secondary | ICD-10-CM

## 2023-06-18 MED ORDER — BENZONATATE 200 MG PO CAPS
200.0000 mg | ORAL_CAPSULE | Freq: Two times a day (BID) | ORAL | 0 refills | Status: DC | PRN
Start: 2023-06-18 — End: 2023-08-02

## 2023-06-18 MED ORDER — HYDROCODONE BIT-HOMATROP MBR 5-1.5 MG/5ML PO SOLN
5.0000 mL | Freq: Three times a day (TID) | ORAL | 0 refills | Status: DC | PRN
Start: 2023-06-18 — End: 2023-08-02

## 2023-06-18 NOTE — Progress Notes (Signed)
Subjective:    Patient ID: Michael Church, male    DOB: March 27, 1956, 67 y.o.   MRN: [id number]  Cough This is a new problem. The current episode started 1 to 4 weeks ago (3 weeks). The cough is Non-productive. Associated symptoms include headaches and heartburn. Pertinent negatives include no chest pain, chills, fever, nasal congestion, postnasal drip, rhinorrhea, shortness of breath, weight loss or wheezing. Associated symptoms comments: + PND . The symptoms are aggravated by lying down. He has tried OTC cough suppressant (Mucinex, Zyrtec and Nasacort) for the symptoms. The treatment provided mild relief. His past medical history is significant for environmental allergies. There is no history of asthma, COPD, emphysema or pneumonia.      Review of Systems  Constitutional:  Negative for chills, fever and weight loss.  HENT:  Negative for postnasal drip and rhinorrhea.   Respiratory:  Positive for cough. Negative for shortness of breath and wheezing.   Cardiovascular:  Negative for chest pain.  Gastrointestinal:  Positive for heartburn.  Allergic/Immunologic: Positive for environmental allergies.  Neurological:  Positive for headaches.   Past Medical History:  Diagnosis Date   Chronic rhinitis    followed by dr Irena Cords   GERD (gastroesophageal reflux disease)    Herniated lumbar intervertebral disc    not a problem presently   History of epistaxis    History of kidney stones    History of renal cell carcinoma 08/2016   urologist--- dr Marlou Porch;   s/p right partial nephrecotmy for papillary RCC, no chemo / radiation  (previously seen by oncology--- dr Bertis Ruddy, lov note in epic 04-04-2017)   Hypertension    Incisional hernia    Nocturia    OA (osteoarthritis)    knees   Seasonal allergies    Type 2 diabetes mellitus (HCC)    followed by pcp---  (11-27-2021  per pt check blood sugar twice daily, fasting sugar---112--140)   Varicose vein of leg     Social History    Socioeconomic History   Marital status: Married    Spouse name: Sue Lush   Number of children: 2   Years of education: Not on file   Highest education level: Bachelor's degree (e.g., BA, AB, BS)  Occupational History   Occupation: Code and compliance    Employer: [employer]  Tobacco Use   Smoking status: Some Days    Types: Cigars   Smokeless tobacco: Never   Tobacco comments:    11-27-2021  Per pt average one cigar every 2-3 months  Vaping Use   Vaping status: Never Used  Substance and Sexual Activity   Alcohol use: Not Currently    Comment: occasional   Drug use: No   Sexual activity: Yes  Other Topics Concern   Not on file  Social History Narrative   Retired from post office    Works part time for the [employer]    Social Determinants of [employer]: Low Risk  (03/26/2022)   Overall Financial Resource Strain (CARDIA)    Difficulty of Paying Living Expenses: Not hard at all  Food Insecurity: No Food Insecurity (03/26/2022)   Hunger Vital Sign    Worried About Running Out of Food in the Last Year: Never true    Ran Out of Food in the Last Year: Never true  Transportation Needs: No Transportation Needs (03/26/2022)   PRAPARE - Administrator, Civil Service (Medical): No    Lack of Transportation (  Non-Medical): No  Physical Activity: Insufficiently Active (03/26/2022)   Exercise Vital Sign    Days of Exercise per Week: 4 days    Minutes of Exercise per Session: 20 min  Stress: Patient Declined (03/26/2022)   [employer] of Occupational Health - Occupational Stress Questionnaire    Feeling of Stress : Patient declined  Social Connections: Unknown (03/26/2022)   Social Connection and Isolation Panel [NHANES]    Frequency of Communication with Friends and Family: Once a week    Frequency of Social Gatherings with Friends and Family: Patient declined    Attends Religious Services: Patient declined    Doctor, general practice or Organizations: Yes    Attends Banker Meetings: Patient declined    Marital Status: Married  Catering manager Violence: Not on file    Past Surgical History:  Procedure Laterality Date   CYSTECTOMY     per pt as child had a cyst removed that had was deep in chest wall   EXCISION MASS NECK  10/2011   posterior neck   INCISIONAL HERNIA REPAIR N/A 11/29/2021   Procedure: OPEN INCISIONAL HERNIA REPAIR WITH MESH;  Surgeon: Abigail Miyamoto, MD;  Location: [facility] [place];  Service: General;  Laterality: N/A;   KNEE ARTHROSCOPY W/ MENISCAL REPAIR Bilateral 03/26/2011   @[facility]  by dr [name],  left knee//    right knee in 2017   MASS EXCISION N/A 09/11/2017   Procedure: MINOR EXCISION OF BACK MASS, EXCISION OF GLUTEAL MASS;  Surgeon: Axel Filler, MD;  Location: [facility];  Service: General;  Laterality: N/A;   ROBOTIC ASSITED PARTIAL NEPHRECTOMY Right 09/07/2016   Procedure: XI ROBOTIC ASSITED LAPAROSCOPIC RIGHT PARTIAL NEPHRECTOMY;  Surgeon: Crist Fat, MD;  Location: [facility];  Service: Urology;  Laterality: Right;   TONSILLECTOMY     child   VARICOSE VEIN SURGERY Bilateral 01/2013   per pt stripping    Family History  Problem Relation Age of Onset   Stroke Father        age 64   Cancer Brother        Hodgkin   HIV/AIDS Brother        age 10    Colon cancer Neg Hx    Esophageal cancer Neg Hx    Rectal cancer Neg Hx    Stomach cancer Neg Hx     Allergies  Allergen Reactions   Other Itching    Foods - cantaloupes, tomatoes, bananas, and  raw carrots.   Penicillins Itching    Has patient had a PCN reaction causing immediate rash, facial/tongue/throat swelling, SOB or lightheadedness with hypotension: No Has patient had a PCN reaction causing severe rash involving mucus membranes or skin necrosis: No Has patient had a PCN reaction that required hospitalization: No Has patient had a PCN reaction occurring within the last 10 years: No If all  of the above answers are "NO", then may proceed with Cephalosporin use.     Current Outpatient Medications on File Prior to Visit  Medication Sig Dispense Refill   blood glucose meter kit and supplies KIT Dispense based on patient and insurance preference. Use up to three times daily as directed. 1 each 0   Coenzyme Q10 (COQ10 PO) Take 1 tablet by mouth at bedtime.     Continuous Glucose Sensor (FREESTYLE LIBRE 3 SENSOR) MISC 1 Device by Does not apply route every 14 (fourteen) days. Place 1 sensor on the skin every 14 days. Use to check glucose continuously  2 each 6   EPINEPHrine 0.3 mg/0.3 mL IJ SOAJ injection Inject 0.3 mg into the muscle as needed.     famotidine (PEPCID) 20 MG tablet Take 20 mg by mouth daily as needed.     GELSYN-3 16.8 MG/2ML SOSY intra-articular injection      hydrochlorothiazide (MICROZIDE) 12.5 MG capsule TAKE 1 CAPSULE(12.5 MG) BY MOUTH DAILY 90 capsule 3   metFORMIN (GLUCOPHAGE) 1000 MG tablet Take 1 tablet (1,000 mg total) by mouth 2 (two) times daily with a meal. 180 tablet 3   Multiple Vitamin (MULTIVITAMIN WITH MINERALS) TABS Take 1 tablet by mouth daily.     OVER THE COUNTER MEDICATION Take by mouth daily. Tribulus testosterone supplement     oxymetazoline (AFRIN) 0.05 % nasal spray Place 1 spray into both nostrils 2 (two) times daily as needed (nose bleeds).     PRESCRIPTION MEDICATION PER PT ALLERGY INJECTION WKLY AND TWICE PER MONTH IN SUMMER     simvastatin (ZOCOR) 5 MG tablet Take 1 tablet (5 mg total) by mouth at bedtime. 90 tablet 3   sodium chloride (OCEAN) 0.65 % SOLN nasal spray Place 1 spray into both nostrils as needed for congestion.     tamsulosin (FLOMAX) 0.4 MG CAPS capsule Take 0.4 mg by mouth daily.     TURMERIC PO Take 1 tablet by mouth 2 (two) times daily.     triamcinolone (NASACORT) 55 MCG/ACT AERO nasal inhaler Place 2 sprays into the nose daily. 2 sprays each nostril at night 1 each 12   No current facility-administered medications on  file prior to visit.    BP 130/82   Pulse 73   Temp 98.5 F (36.9 C) (Oral)   Ht 5' 10.5" (1.791 m)   Wt 258 lb (117 kg)   SpO2 97%   BMI 36.50 kg/m       Objective:   Physical Exam Vitals and nursing note reviewed.  Constitutional:      Appearance: Normal appearance.  HENT:     Nose: No congestion or rhinorrhea.     Right Turbinates: Not enlarged or swollen.     Left Turbinates: Not enlarged or swollen.     Right Sinus: No maxillary sinus tenderness or frontal sinus tenderness.     Left Sinus: No maxillary sinus tenderness or frontal sinus tenderness.     Mouth/Throat:     Mouth: Mucous membranes are moist.     Pharynx: Oropharynx is clear. Uvula midline. No oropharyngeal exudate or posterior oropharyngeal erythema.  Cardiovascular:     Rate and Rhythm: Normal rate and regular rhythm.     Pulses: Normal pulses.     Heart sounds: Normal heart sounds.  Pulmonary:     Effort: Pulmonary effort is normal.     Breath sounds: Normal breath sounds.  Skin:    General: Skin is warm and dry.  Neurological:     General: No focal deficit present.     Mental Status: He is alert and oriented to person, place, and time.  Psychiatric:        Mood and Affect: Mood normal.        Behavior: Behavior normal.        Thought Content: Thought content normal.        Judgment: Judgment normal.        Assessment & Plan:  1. Non-productive cough -Will treat for acute cough with Tessalon Perles and Hycodan cough syrup that he can use at nighttime.  Cannot rule out  silent acid reflux just yet.  Due to having a cough for 3 weeks we will do chest x-ray and will follow-up once chest x-ray has resolved.  If he is still coughing then we can look at prescribing a PPI. - benzonatate (TESSALON) 200 MG capsule; Take 1 capsule (200 mg total) by mouth 2 (two) times daily as needed for cough.  Dispense: 20 capsule; Refill: 0 - HYDROcodone bit-homatropine (HYCODAN) 5-1.5 MG/5ML syrup; Take 5 mLs by mouth  every 8 (eight) hours as needed for cough.  Dispense: 120 mL; Refill: 0 - DG Chest 2 View; Future  2. Need for pneumococcal vaccine  - Pneumococcal conjugate vaccine 20-valent (Prevnar 20)  Time spent with patient today was 31 minutes which consisted of chart review, discussing cough, silent acid reflux, work up, treatment answering questions and documentation.

## 2023-06-18 NOTE — Addendum Note (Signed)
Addended by: Nancy Fetter on: 06/18/2023 05:14 PM   Modules accepted: Level of Service

## 2023-06-19 ENCOUNTER — Ambulatory Visit (INDEPENDENT_AMBULATORY_CARE_PROVIDER_SITE_OTHER)
Admission: RE | Admit: 2023-06-19 | Discharge: 2023-06-19 | Disposition: A | Discharge status: Home / Self Care | Payer: No Typology Code available for payment source | Source: Ambulatory Visit | Attending: Adult Health | Admitting: Adult Health

## 2023-06-19 DIAGNOSIS — R058 Other specified cough: Secondary | ICD-10-CM

## 2023-08-02 ENCOUNTER — Ambulatory Visit: Payer: No Typology Code available for payment source | Admitting: Adult Health

## 2023-08-02 ENCOUNTER — Encounter: Payer: Self-pay | Admitting: Adult Health

## 2023-08-02 VITALS — BP 136/84 | HR 85 | Temp 98.3°F | Ht 70.5 in | Wt 256.5 lb

## 2023-08-02 DIAGNOSIS — Z7984 Long term (current) use of oral hypoglycemic drugs: Secondary | ICD-10-CM | POA: Diagnosis not present

## 2023-08-02 DIAGNOSIS — E119 Type 2 diabetes mellitus without complications: Secondary | ICD-10-CM | POA: Diagnosis not present

## 2023-08-02 DIAGNOSIS — Z01818 Encounter for other preprocedural examination: Secondary | ICD-10-CM | POA: Diagnosis not present

## 2023-08-02 DIAGNOSIS — I10 Essential (primary) hypertension: Secondary | ICD-10-CM | POA: Diagnosis not present

## 2023-08-02 LAB — CBC
HCT: 45.2 % (ref 39.0–52.0)
Hemoglobin: 14.9 g/dL (ref 13.0–17.0)
MCHC: 33 g/dL (ref 30.0–36.0)
MCV: 88.1 fL (ref 78.0–100.0)
Platelets: 256 10*3/uL (ref 150.0–400.0)
RBC: 5.13 Mil/uL (ref 4.22–5.81)
RDW: 13.1 % (ref 11.5–15.5)
WBC: 5 10*3/uL (ref 4.0–10.5)

## 2023-08-02 LAB — PROTIME-INR
INR: 1 {ratio} (ref 0.8–1.0)
Prothrombin Time: 10.9 s (ref 9.6–13.1)

## 2023-08-02 LAB — COMPREHENSIVE METABOLIC PANEL
ALT: 21 U/L (ref 0–53)
AST: 18 U/L (ref 0–37)
Albumin: 4.5 g/dL (ref 3.5–5.2)
Alkaline Phosphatase: 38 U/L — ABNORMAL LOW (ref 39–117)
BUN: 17 mg/dL (ref 6–23)
CO2: 31 meq/L (ref 19–32)
Calcium: 9.6 mg/dL (ref 8.4–10.5)
Chloride: 100 meq/L (ref 96–112)
Creatinine, Ser: 1.2 mg/dL (ref 0.40–1.50)
GFR: 63.49 mL/min (ref >60.00)
Glucose, Bld: 92 mg/dL (ref 70–99)
Potassium: 3.8 meq/L (ref 3.5–5.1)
Sodium: 139 meq/L (ref 135–145)
Total Bilirubin: 0.6 mg/dL (ref 0.2–1.2)
Total Protein: 7.4 g/dL (ref 6.0–8.3)

## 2023-08-02 LAB — HEMOGLOBIN A1C: Hgb A1c MFr Bld: 7.2 % — ABNORMAL HIGH (ref 4.6–6.5)

## 2023-08-02 NOTE — Progress Notes (Signed)
Subjective:    Patient ID: Michael Church, male    DOB: 08-May-1956, 67 y.o.   MRN: [id number]  HPI 67 year old male who  has a past medical history of Chronic rhinitis, GERD (gastroesophageal reflux disease), Herniated lumbar intervertebral disc, History of epistaxis, History of kidney stones, History of renal cell carcinoma (08/2016), Hypertension, Incisional hernia, Nocturia, OA (osteoarthritis), Seasonal allergies, Type 2 diabetes mellitus (HCC), and Varicose vein of leg.  He presents to the office today for pre operative clearance. He is having a left total knee replacement done by Dr. [name]  at [facility].   He is a diabetic and takes Metformin 1000 mg BID. Lab Results  Component Value Date   HGBA1C 6.7 (A) 02/19/2023   HGBA1C 6.7 (H) 03/27/2022   HGBA1C 6.8 (H) 09/22/2021   He also has a history of HTN and takes hydrochlorothiazide 12.5 mg daily  BP Readings from Last 3 Encounters:  08/02/23 136/84  06/18/23 130/82  04/11/23 130/80    Review of Systems See HPI   Past Medical History:  Diagnosis Date   Chronic rhinitis    followed by dr Irena Cords   GERD (gastroesophageal reflux disease)    Herniated lumbar intervertebral disc    not a problem presently   History of epistaxis    History of kidney stones    History of renal cell carcinoma 08/2016   urologist--- dr Marlou Porch;   s/p right partial nephrecotmy for papillary RCC, no chemo / radiation  (previously seen by oncology--- dr Bertis Ruddy, lov note in epic 04-04-2017)   Hypertension    Incisional hernia    Nocturia    OA (osteoarthritis)    knees   Seasonal allergies    Type 2 diabetes mellitus (HCC)    followed by pcp---  (11-27-2021  per pt check blood sugar twice daily, fasting sugar---112--140)   Varicose vein of leg     Social History   Socioeconomic History   Marital status: Married    Spouse name: Sue Lush   Number of children: 2   Years of education: Not on file   Highest education level:  Bachelor's degree (e.g., BA, AB, BS)  Occupational History   Occupation: Code and compliance    Employer: [employer]  Tobacco Use   Smoking status: Some Days    Types: Cigars   Smokeless tobacco: Never   Tobacco comments:    11-27-2021  Per pt average one cigar every 2-3 months  Vaping Use   Vaping status: Never Used  Substance and Sexual Activity   Alcohol use: Not Currently    Comment: occasional   Drug use: No   Sexual activity: Yes  Other Topics Concern   Not on file  Social History Narrative   Retired from post office    Works part time for the [employer]    Social Determinants of [employer]: Low Risk  (03/26/2022)   Overall Financial Resource Strain (CARDIA)    Difficulty of Paying Living Expenses: Not hard at all  Food Insecurity: No Food Insecurity (03/26/2022)   Hunger Vital Sign    Worried About Running Out of Food in the Last Year: Never true    Ran Out of Food in the Last Year: Never true  Transportation Needs: No Transportation Needs (03/26/2022)   PRAPARE - Administrator, Civil Service (Medical): No    Lack of Transportation (Non-Medical): No  Physical Activity: Insufficiently Active (03/26/2022)  Exercise Vital Sign    Days of Exercise per Week: 4 days    Minutes of Exercise per Session: 20 min  Stress: Patient Declined (03/26/2022)   [employer] of Occupational Health - Occupational Stress Questionnaire    Feeling of Stress : Patient declined  Social Connections: Unknown (03/26/2022)   Social Connection and Isolation Panel [NHANES]    Frequency of Communication with Friends and Family: Once a week    Frequency of Social Gatherings with Friends and Family: Patient declined    Attends Religious Services: Patient declined    Database administrator or Organizations: Yes    Attends Banker Meetings: Patient declined    Marital Status: Married  Catering manager Violence: Not on file     Past Surgical History:  Procedure Laterality Date   CYSTECTOMY     per pt as child had a cyst removed that had was deep in chest wall   EXCISION MASS NECK  10/2011   posterior neck   INCISIONAL HERNIA REPAIR N/A 11/29/2021   Procedure: OPEN INCISIONAL HERNIA REPAIR WITH MESH;  Surgeon: Abigail Miyamoto, MD;  Location: [facility] [place];  Service: General;  Laterality: N/A;   KNEE ARTHROSCOPY W/ MENISCAL REPAIR Bilateral 03/26/2011   @[facility]  by dr [name],  left knee//    right knee in 2017   MASS EXCISION N/A 09/11/2017   Procedure: MINOR EXCISION OF BACK MASS, EXCISION OF GLUTEAL MASS;  Surgeon: Axel Filler, MD;  Location: [facility];  Service: General;  Laterality: N/A;   ROBOTIC ASSITED PARTIAL NEPHRECTOMY Right 09/07/2016   Procedure: XI ROBOTIC ASSITED LAPAROSCOPIC RIGHT PARTIAL NEPHRECTOMY;  Surgeon: Crist Fat, MD;  Location: [facility];  Service: Urology;  Laterality: Right;   TONSILLECTOMY     child   VARICOSE VEIN SURGERY Bilateral 01/2013   per pt stripping    Family History  Problem Relation Age of Onset   Stroke Father        age 53   Cancer Brother        Hodgkin   HIV/AIDS Brother        age 48    Colon cancer Neg Hx    Esophageal cancer Neg Hx    Rectal cancer Neg Hx    Stomach cancer Neg Hx     Allergies  Allergen Reactions   Other Itching    Foods - cantaloupes, tomatoes, bananas, and  raw carrots.   Penicillins Itching    Has patient had a PCN reaction causing immediate rash, facial/tongue/throat swelling, SOB or lightheadedness with hypotension: No Has patient had a PCN reaction causing severe rash involving mucus membranes or skin necrosis: No Has patient had a PCN reaction that required hospitalization: No Has patient had a PCN reaction occurring within the last 10 years: No If all of the above answers are "NO", then may proceed with Cephalosporin use.     Current Outpatient Medications on File Prior to Visit  Medication Sig  Dispense Refill   blood glucose meter kit and supplies KIT Dispense based on patient and insurance preference. Use up to three times daily as directed. 1 each 0   Coenzyme Q10 (COQ10 PO) Take 1 tablet by mouth at bedtime.     Continuous Glucose Sensor (FREESTYLE LIBRE 3 SENSOR) MISC 1 Device by Does not apply route every 14 (fourteen) days. Place 1 sensor on the skin every 14 days. Use to check glucose continuously 2 each 6   EPINEPHrine 0.3 mg/0.3 mL IJ  SOAJ injection Inject 0.3 mg into the muscle as needed.     famotidine (PEPCID) 20 MG tablet Take 20 mg by mouth daily as needed.     GELSYN-3 16.8 MG/2ML SOSY intra-articular injection      hydrochlorothiazide (MICROZIDE) 12.5 MG capsule TAKE 1 CAPSULE(12.5 MG) BY MOUTH DAILY 90 capsule 3   metFORMIN (GLUCOPHAGE) 1000 MG tablet Take 1 tablet (1,000 mg total) by mouth 2 (two) times daily with a meal. 180 tablet 3   Multiple Vitamin (MULTIVITAMIN WITH MINERALS) TABS Take 1 tablet by mouth daily.     OVER THE COUNTER MEDICATION Take by mouth daily. Tribulus testosterone supplement     oxymetazoline (AFRIN) 0.05 % nasal spray Place 1 spray into both nostrils 2 (two) times daily as needed (nose bleeds).     PRESCRIPTION MEDICATION PER PT ALLERGY INJECTION WKLY AND TWICE PER MONTH IN SUMMER     simvastatin (ZOCOR) 5 MG tablet Take 1 tablet (5 mg total) by mouth at bedtime. 90 tablet 3   sodium chloride (OCEAN) 0.65 % SOLN nasal spray Place 1 spray into both nostrils as needed for congestion.     tamsulosin (FLOMAX) 0.4 MG CAPS capsule Take 0.4 mg by mouth daily.     triamcinolone (NASACORT) 55 MCG/ACT AERO nasal inhaler Place 2 sprays into the nose daily. 2 sprays each nostril at night 1 each 12   TURMERIC PO Take 1 tablet by mouth 2 (two) times daily.     No current facility-administered medications on file prior to visit.    BP 136/84   Pulse 85   Temp 98.3 F (36.8 C) (Temporal)   Ht 5' 10.5" (1.791 m)   Wt 256 lb 8 oz (116.3 kg)   SpO2 98%    BMI 36.28 kg/m       Objective:   Physical Exam Vitals and nursing note reviewed.  Constitutional:      Appearance: Normal appearance.  Cardiovascular:     Rate and Rhythm: Normal rate and regular rhythm.     Pulses: Normal pulses.     Heart sounds: Normal heart sounds.  Pulmonary:     Effort: Pulmonary effort is normal.     Breath sounds: Normal breath sounds.  Musculoskeletal:        General: Normal range of motion.  Skin:    General: Skin is warm and dry.  Neurological:     General: No focal deficit present.     Mental Status: He is alert and oriented to person, place, and time.  Psychiatric:        Mood and Affect: Mood normal.        Behavior: Behavior normal.        Thought Content: Thought content normal.        Judgment: Judgment normal.       Assessment & Plan:  1. Preoperative clearance  - EKG 12-Lead- Sinus Rhythm with a rate of 81.  - CBC; Future - Comprehensive metabolic panel; Future - Protime-INR; Future - Hemoglobin A1c; Future  2. Diabetes mellitus treated with oral medication (HCC) - Consider adding agent  - Hemoglobin A1c; Future  3. Essential hypertension -  At goal. No change in medication   Shirline Frees, NP

## 2023-08-06 ENCOUNTER — Other Ambulatory Visit: Payer: Self-pay | Admitting: Adult Health

## 2023-08-06 MED ORDER — METFORMIN HCL ER 500 MG PO TB24: 1000.0000 mg | ORAL_TABLET | Freq: Two times a day (BID) | ORAL | 0 refills | Status: DC

## 2023-08-08 ENCOUNTER — Ambulatory Visit: Payer: Self-pay | Admitting: Orthopedic Surgery

## 2023-08-29 NOTE — Patient Instructions (Signed)
SURGICAL WAITING ROOM VISITATION  Patients having surgery or a procedure may have no more than 2 support people in the waiting area - these visitors may rotate.    Children under the age of 34 must have an adult with them who is not the patient.  Due to an increase in RSV and influenza rates and associated hospitalizations, children ages 27 and under may not visit patients in [facility] hospitals.  If the patient needs to stay at the hospital during part of their recovery, the visitor guidelines for inpatient rooms apply. Pre-op nurse will coordinate an appropriate time for 1 support person to accompany patient in pre-op.  This support person may not rotate.    Please refer to the [facility] website for the visitor guidelines for Inpatients (after your surgery is over and you are in a regular room).       Your procedure is scheduled on: 09/04/23   Report to [facility] Main Entrance    Report to admitting at 9 AM   Call this number if you have problems the morning of surgery [phone]   Do not eat food :After Midnight.   After Midnight you may have the following liquids until 8:30 AM DAY OF SURGERY  Water Non-Citrus Juices (without pulp, NO RED-Apple, White grape, White cranberry) Black Coffee (NO MILK/CREAM OR CREAMERS, sugar ok)  Clear Tea (NO MILK/CREAM OR CREAMERS, sugar ok) regular and decaf                             Plain Jell-O (NO RED)                                           Fruit ices (not with fruit pulp, NO RED)                                     Popsicles (NO RED)                                                               Sports drinks like Gatorade (NO RED)                The day of surgery:  Drink ONE (1) Pre-Surgery G2 at 8:30 AM the morning of surgery. Drink in one sitting. Do not sip.  This drink was given to you during your hospital  pre-op appointment visit. Nothing else to drink after completing the  Pre-Surgery G2.     Oral  Hygiene is also important to reduce your risk of infection.                                    Remember - BRUSH YOUR TEETH THE MORNING OF SURGERY WITH YOUR REGULAR TOOTHPASTE  DENTURES WILL BE REMOVED PRIOR TO SURGERY PLEASE DO NOT APPLY "Poly grip" OR ADHESIVES!!!   Stop all vitamins and herbal supplements 7 days before surgery.   Take these medicines the morning of surgery with  A SIP OF WATER: Cetirizine, Famotidine, Simvastatin and tamsulosin  DO NOT TAKE ANY ORAL DIABETIC MEDICATIONS DAY OF YOUR SURGERY DO NOT TAKE METFORMIN THE DAY OF SURGERY             You may not have any metal on your body including hair pins, jewelry, and body piercing             Do not wear make-up, lotions, powders, perfumes/cologne, or deodorant              Men may shave face and neck.   Do not bring valuables to the hospital. [facility] IS NOT             RESPONSIBLE   FOR VALUABLES.   Contacts, glasses, dentures or bridgework may not be worn into surgery.   Bring small overnight bag day of surgery.   DO NOT BRING YOUR HOME MEDICATIONS TO THE HOSPITAL. PHARMACY WILL DISPENSE MEDICATIONS LISTED ON YOUR MEDICATION LIST TO YOU DURING YOUR ADMISSION IN THE HOSPITAL!    Patients discharged on the day of surgery will not be allowed to drive home.  Someone NEEDS to stay with you for the first 24 hours after anesthesia.   Special Instructions: Bring a copy of your healthcare power of attorney and living will documents the day of surgery if you haven't scanned them before.              Please read over the following fact sheets you were given: IF YOU HAVE QUESTIONS ABOUT YOUR PRE-OP INSTRUCTIONS PLEASE CALL [phone] [facility]   If you received a COVID test during your pre-op visit  it is requested that you wear a mask when out in public, stay away from anyone that may not be feeling well and notify your surgeon if you develop symptoms. If you test positive for Covid or have been in contact with anyone that  has tested positive in the last 10 days please notify you surgeon.      Pre-operative 5 CHG Bath Instructions   You can play a key role in reducing the risk of infection after surgery. Your skin needs to be as free of germs as possible. You can reduce the number of germs on your skin by washing with CHG (chlorhexidine gluconate) soap before surgery. CHG is an antiseptic soap that kills germs and continues to kill germs even after washing.   DO NOT use if you have an allergy to chlorhexidine/CHG or antibacterial soaps. If your skin becomes reddened or irritated, stop using the CHG and notify one of our RNs at [phone].   Please shower with the CHG soap starting 4 days before surgery using the following schedule:     Please keep in mind the following:  DO NOT shave, including legs and underarms, starting the day of your first shower.   You may shave your face at any point before/day of surgery.  Place clean sheets on your bed the day you start using CHG soap. Use a clean washcloth (not used since being washed) for each shower. DO NOT sleep with pets once you start using the CHG.   CHG Shower Instructions:  If you choose to wash your hair and private area, wash first with your normal shampoo/soap.  After you use shampoo/soap, rinse your hair and body thoroughly to remove shampoo/soap residue.  Turn the water OFF and apply about 3 tablespoons (45 ml) of CHG soap to a CLEAN washcloth.  Apply CHG soap ONLY FROM  YOUR NECK DOWN TO YOUR TOES (washing for 3-5 minutes)  DO NOT use CHG soap on face, private areas, open wounds, or sores.  Pay special attention to the area where your surgery is being performed.  If you are having back surgery, having someone wash your back for you may be helpful. Wait 2 minutes after CHG soap is applied, then you may rinse off the CHG soap.  Pat dry with a clean towel  Put on clean clothes/pajamas   If you choose to wear lotion, please use ONLY the  CHG-compatible lotions on the back of this paper.     Additional instructions for the day of surgery: DO NOT APPLY any lotions, deodorants, cologne, or perfumes.   Put on clean/comfortable clothes.  Brush your teeth.  Ask your nurse before applying any prescription medications to the skin.      CHG Compatible Lotions   Aveeno Moisturizing lotion  Cetaphil Moisturizing Cream  Cetaphil Moisturizing Lotion  Clairol Herbal Essence Moisturizing Lotion, Dry Skin  Clairol Herbal Essence Moisturizing Lotion, Extra Dry Skin  Clairol Herbal Essence Moisturizing Lotion, Normal Skin  Curel Age Defying Therapeutic Moisturizing Lotion with Alpha Hydroxy  Curel Extreme Care Body Lotion  Curel Soothing Hands Moisturizing Hand Lotion  Curel Therapeutic Moisturizing Cream, Fragrance-Free  Curel Therapeutic Moisturizing Lotion, Fragrance-Free  Curel Therapeutic Moisturizing Lotion, Original Formula  Eucerin Daily Replenishing Lotion  Eucerin Dry Skin Therapy Plus Alpha Hydroxy Crme  Eucerin Dry Skin Therapy Plus Alpha Hydroxy Lotion  Eucerin Original Crme  Eucerin Original Lotion  Eucerin Plus Crme Eucerin Plus Lotion  Eucerin TriLipid Replenishing Lotion  Keri Anti-Bacterial Hand Lotion  Keri Deep Conditioning Original Lotion Dry Skin Formula Softly Scented  Keri Deep Conditioning Original Lotion, Fragrance Free Sensitive Skin Formula  Keri Lotion Fast Absorbing Fragrance Free Sensitive Skin Formula  Keri Lotion Fast Absorbing Softly Scented Dry Skin Formula  Keri Original Lotion  Keri Skin Renewal Lotion Keri Silky Smooth Lotion  Keri Silky Smooth Sensitive Skin Lotion  Nivea Body Creamy Conditioning Oil  Nivea Body Extra Enriched Lotion  Nivea Body Original Lotion  Nivea Body Sheer Moisturizing Lotion Nivea Crme  Nivea Skin Firming Lotion  NutraDerm 30 Skin Lotion  NutraDerm Skin Lotion  NutraDerm Therapeutic Skin Cream  NutraDerm Therapeutic Skin Lotion  ProShield Protective  Hand Cream   Incentive Spirometer (Watch this video at home: [email or web address])  An incentive spirometer is a tool that can help keep your lungs clear and active. This tool measures how well you are filling your lungs with each breath. Taking long deep breaths may help reverse or decrease the chance of developing breathing (pulmonary) problems (especially infection) following: A long period of time when you are unable to move or be active. BEFORE THE PROCEDURE  If the spirometer includes an indicator to show your best effort, your nurse or respiratory therapist will set it to a desired goal. If possible, sit up straight or lean slightly forward. Try not to slouch. Hold the incentive spirometer in an upright position. INSTRUCTIONS FOR USE  Sit on the edge of your bed if possible, or sit up as far as you can in bed or on a chair. Hold the incentive spirometer in an upright position. Breathe out normally. Place the mouthpiece in your mouth and seal your lips tightly around it. Breathe in slowly and as deeply as possible, raising the piston or the ball toward the top of the column. Hold your breath for 3-5 seconds or for as long as  possible. Allow the piston or ball to fall to the bottom of the column. Remove the mouthpiece from your mouth and breathe out normally. Rest for a few seconds and repeat Steps 1 through 7 at least 10 times every 1-2 hours when you are awake. Take your time and take a few normal breaths between deep breaths. The spirometer may include an indicator to show your best effort. Use the indicator as a goal to work toward during each repetition. After each set of 10 deep breaths, practice coughing to be sure your lungs are clear. If you have an incision (the cut made at the time of surgery), support your incision when coughing by placing a pillow or rolled up towels firmly against it. Once you are able to get out of bed, walk around indoors and cough  well. You may stop using the incentive spirometer when instructed by your caregiver.  RISKS AND COMPLICATIONS Take your time so you do not get dizzy or light-headed. If you are in pain, you may need to take or ask for pain medication before doing incentive spirometry. It is harder to take a deep breath if you are having pain. AFTER USE Rest and breathe slowly and easily. It can be helpful to keep track of a log of your progress. Your caregiver can provide you with a simple table to help with this. If you are using the spirometer at home, follow these instructions: SEEK MEDICAL CARE IF:  You are having difficultly using the spirometer. You have trouble using the spirometer as often as instructed. Your pain medication is not giving enough relief while using the spirometer. You develop fever of 100.5 F (38.1 C) or higher. SEEK IMMEDIATE MEDICAL CARE IF:  You cough up bloody sputum that had not been present before. You develop fever of 102 F (38.9 C) or greater. You develop worsening pain at or near the incision site. MAKE SURE YOU:  Understand these instructions. Will watch your condition. Will get help right away if you are not doing well or get worse. Document Released: 01/14/2007 Document Revised: 11/26/2011 Document Reviewed: 03/17/2007 [facility] Patient Information 2014 [place], [place].

## 2023-08-29 NOTE — Progress Notes (Addendum)
COVID Vaccine received:  []  No [x]  Yes Date of any COVID positive Test in last 90 days: no PCP - Theresia Majors NP Cardiologist - no  Chest x-ray -06/19/23 Epic EKG -  08/02/23 Epic Stress Test -  ECHO -  Cardiac Cath -   Bowel Prep - [x]  No  []   Yes ______  Pacemaker / ICD device [x]  No []  Yes   Spinal Cord Stimulator:[x]  No []  Yes       History of Sleep Apnea? [x]  No []  Yes   CPAP used?- [x]  No []  Yes    Does the patient monitor blood sugar?          []  No [x]  Yes  []  N/A  Patient has: []  NO Hx DM   []  Pre-DM                 []  DM1  [x]   DM2 Does patient have a [employer] or Dexacom? []  No [x]  Yes   Fasting Blood Sugar Ranges- 120's-150's Checks Blood Sugar 6___ times a day  GLP1 agonist / usual dose - no GLP1 instructions:  SGLT-2 inhibitors / usual dose - no SGLT-2 instructions:   Blood Thinner / Instructions:no Aspirin Instructions:no  Comments:   Activity level: Patient is able to climb a flight of stairs without difficulty; [x]  No CP  [x]  No SOB, ___   Patient can  perform ADLs without assistance.   Anesthesia review:   Patient denies shortness of breath, fever, cough and chest pain at PAT appointment.  Patient verbalized understanding and agreement to the Pre-Surgical Instructions that were given to them at this PAT appointment. Patient was also educated of the need to review these PAT instructions again prior to his/her surgery.I reviewed the appropriate phone numbers to call if they have any and questions or concerns.

## 2023-08-30 ENCOUNTER — Other Ambulatory Visit: Payer: Self-pay

## 2023-08-30 ENCOUNTER — Encounter (HOSPITAL_COMMUNITY)
Admission: RE | Admit: 2023-08-30 | Discharge: 2023-08-30 | Disposition: A | Discharge status: Home / Self Care | Payer: No Typology Code available for payment source | Source: Ambulatory Visit | Attending: Specialist | Admitting: Specialist

## 2023-08-30 ENCOUNTER — Ambulatory Visit: Payer: Self-pay | Admitting: Orthopedic Surgery

## 2023-08-30 ENCOUNTER — Encounter (HOSPITAL_COMMUNITY): Payer: Self-pay

## 2023-08-30 VITALS — BP 149/95 | HR 74 | Temp 98.2°F | Resp 16 | Ht 70.5 in | Wt 251.0 lb

## 2023-08-30 DIAGNOSIS — E119 Type 2 diabetes mellitus without complications: Secondary | ICD-10-CM | POA: Insufficient documentation

## 2023-08-30 DIAGNOSIS — Z01818 Encounter for other preprocedural examination: Secondary | ICD-10-CM | POA: Diagnosis present

## 2023-08-30 DIAGNOSIS — I10 Essential (primary) hypertension: Secondary | ICD-10-CM | POA: Diagnosis not present

## 2023-08-30 HISTORY — DX: Personal history of other diseases of the digestive system: Z87.19

## 2023-08-30 HISTORY — DX: Malignant (primary) neoplasm, unspecified: C80.1

## 2023-08-30 LAB — BASIC METABOLIC PANEL
Anion gap: 11 (ref 5–15)
BUN: 15 mg/dL (ref 8–23)
CO2: 24 mmol/L (ref 22–32)
Calcium: 9.3 mg/dL (ref 8.9–10.3)
Chloride: 100 mmol/L (ref 98–111)
Creatinine, Ser: 1.03 mg/dL (ref 0.61–1.24)
GFR, Estimated: >60 mL/min (ref >60)
Glucose, Bld: 115 mg/dL — ABNORMAL HIGH (ref 70–99)
Potassium: 3.9 mmol/L (ref 3.5–5.1)
Sodium: 135 mmol/L (ref 135–145)

## 2023-08-30 LAB — CBC
HCT: 43.6 % (ref 39.0–52.0)
Hemoglobin: 15 g/dL (ref 13.0–17.0)
MCH: 29.8 pg (ref 26.0–34.0)
MCHC: 34.4 g/dL (ref 30.0–36.0)
MCV: 86.7 fL (ref 80.0–100.0)
Platelets: 238 10*3/uL (ref 150–400)
RBC: 5.03 MIL/uL (ref 4.22–5.81)
RDW: 12.6 % (ref 11.5–15.5)
WBC: 4.2 10*3/uL (ref 4.0–10.5)
nRBC: 0 % (ref 0.0–0.2)

## 2023-08-30 LAB — SURGICAL PCR SCREEN
MRSA, PCR: NEGATIVE
Staphylococcus aureus: NEGATIVE

## 2023-08-30 NOTE — H&P (Signed)
Michael Church is an 67 y.o. male.   Chief Complaint: left knee pain HPI: Patient is here for his H&P. Patient is scheduled for a left total knee replacement by Dr. [name] on 09/04/23 at [facility].  Dr. [name] and the patient mutually agreed to proceed with a total knee replacement. Risks and benefits of the procedure were discussed including stiffness, suboptimal range of motion, persistent pain, infection requiring removal of prosthesis and reinsertion, need for prophylactic antibiotics in the future, for example, dental procedures, possible need for manipulation, revision in the future and also anesthetic complications including DVT, PE, etc. We discussed the perioperative course, time in the hospital, postoperative recovery and the need for elevation to control swelling. We also discussed the predicted range of motion and the probability that squatting and kneeling would be unobtainable in the future. In addition, postoperative anticoagulation was discussed. We have obtained preoperative medical clearance as necessary. Provided illustrated handout and discussed it in detail. They will enroll in the total joint replacement educational forum at the hospital.  He is scheduled for preop at [facility] on December 13. Not currently taking aspirin, NSAIDs, supplements or vitamins.  Past Medical History:  Diagnosis Date   Chronic rhinitis    followed by dr Irena Cords   GERD (gastroesophageal reflux disease)    Herniated lumbar intervertebral disc    not a problem presently   History of epistaxis    History of kidney stones    History of renal cell carcinoma 08/2016   urologist--- dr Marlou Porch;   s/p right partial nephrecotmy for papillary RCC, no chemo / radiation  (previously seen by oncology--- dr Bertis Ruddy, lov note in epic 04-04-2017)   Hypertension    Incisional hernia    Nocturia    OA (osteoarthritis)    knees   Seasonal allergies    Type 2 diabetes mellitus (HCC)    followed  by pcp---  (11-27-2021  per pt check blood sugar twice daily, fasting sugar---112--140)   Varicose vein of leg     Past Surgical History:  Procedure Laterality Date   CYSTECTOMY     per pt as child had a cyst removed that had was deep in chest wall   EXCISION MASS NECK  10/2011   posterior neck   INCISIONAL HERNIA REPAIR N/A 11/29/2021   Procedure: OPEN INCISIONAL HERNIA REPAIR WITH MESH;  Surgeon: Abigail Miyamoto, MD;  Location: [facility];  Service: General;  Laterality: N/A;   KNEE ARTHROSCOPY W/ MENISCAL REPAIR Bilateral 03/26/2011   @[facility]  by dr [name],  left knee//    right knee in 2017   MASS EXCISION N/A 09/11/2017   Procedure: MINOR EXCISION OF BACK MASS, EXCISION OF GLUTEAL MASS;  Surgeon: Axel Filler, MD;  Location: [facility];  Service: General;  Laterality: N/A;   ROBOTIC ASSITED PARTIAL NEPHRECTOMY Right 09/07/2016   Procedure: XI ROBOTIC ASSITED LAPAROSCOPIC RIGHT PARTIAL NEPHRECTOMY;  Surgeon: Crist Fat, MD;  Location: [facility];  Service: Urology;  Laterality: Right;   TONSILLECTOMY     child   VARICOSE VEIN SURGERY Bilateral 01/2013   per pt stripping    Family History  Problem Relation Age of Onset   Stroke Father        age 37   Cancer Brother        Hodgkin   HIV/AIDS Brother        age 13    Colon cancer Neg Hx    Esophageal cancer Neg Hx  Rectal cancer Neg Hx    Stomach cancer Neg Hx    Social History:  reports that he has been smoking cigars. He has never used smokeless tobacco. He reports that he does not currently use alcohol. He reports that he does not use drugs.  Allergies:  Allergies  Allergen Reactions   Mixed Ragweed Other (See Comments)    Aggravate allergies   Other Itching    Foods - cantaloupes, tomatoes, bananas, and  raw carrots.   Penicillins Itching    Has patient had a PCN reaction causing immediate rash, facial/tongue/throat swelling, SOB or lightheadedness with hypotension: No Has patient had a PCN  reaction causing severe rash involving mucus membranes or skin necrosis: No Has patient had a PCN reaction that required hospitalization: No Has patient had a PCN reaction occurring within the last 10 years: No If all of the above answers are "NO", then may proceed with Cephalosporin use.    Current meds: Flomax 0.4 mg capsule hydroCHLOROthiazide 12.5 mg capsule metFORMIN 500 mg tablet rosuvastatin 10 mg tablet  Review of Systems  Constitutional: Negative.   HENT: Negative.    Eyes: Negative.   Respiratory: Negative.    Cardiovascular: Negative.   Gastrointestinal: Negative.   Endocrine: Negative.   Genitourinary: Negative.   Musculoskeletal:  Positive for arthralgias, gait problem, joint swelling and myalgias.  Skin: Negative.   Hematological: Negative.   Psychiatric/Behavioral: Negative.      There were no vitals taken for this visit. Physical Exam Constitutional:      Appearance: Normal appearance.  HENT:     Head: Normocephalic and atraumatic.     Right Ear: External ear normal.     Left Ear: External ear normal.     Nose: Nose normal.     Mouth/Throat:     Pharynx: Oropharynx is clear.  Eyes:     Conjunctiva/sclera: Conjunctivae normal.  Cardiovascular:     Rate and Rhythm: Normal rate and regular rhythm.     Pulses: Normal pulses.     Heart sounds: Normal heart sounds.  Pulmonary:     Effort: Pulmonary effort is normal.     Breath sounds: Normal breath sounds.  Abdominal:     General: Bowel sounds are normal.  Musculoskeletal:     Cervical back: Normal range of motion.     Comments: On exam is tender to palpation medial joint line bilaterally. Patellofemoral pain compression bilaterally. Ranges 0-1 30 bilaterally. No DVT  Skin:    General: Skin is warm and dry.  Neurological:     Mental Status: He is alert.      Assessment/Plan Impression: Left knee end-stage osteoarthritis refractory to conservative treatment  Plan: Pt with end-stage left knee DJD,  bone-on-bone, refractory to conservative tx, scheduled for left total knee replacement by Dr. [name] on December 18. We again discussed the procedure itself as well as risks, complications and alternatives, including but not limited to DVT, PE, infx, bleeding, failure of procedure, need for secondary procedure including manipulation, nerve injury, ongoing pain/symptoms, anesthesia risk, even stroke or death. Also discussed typical post-op protocols, activity restrictions, need for PT, flexion/extension exercises, time out of work. Discussed need for DVT ppx post-op per protocol. Discussed dental ppx and infx prevention. Also discussed limitations post-operatively such as kneeling and squatting. All questions were answered. Patient desires to proceed with surgery as scheduled.  Will hold supplements, ASA and NSAIDs accordingly. Will remain NPO after midnight the night before surgery. Will present to [facility] for pre-op testing.  Anticipate hospital stay to include at least 2 midnights given medical history and to ensure proper pain control. Plan aspirin for DVT ppx post-op. Plan oxycodone Robaxin, Colace, Miralax. Plan home with HHPT post-op with family members at home for assistance. Will follow up 10-14 days post-op for staple removal and xrays.  Plan left total knee replacement  Dorothy Spark, PA-C for Dr [name] 08/30/2023, 8:27 AM

## 2023-08-30 NOTE — H&P (View-Only) (Signed)
Michael Church is an 67 y.o. male.   Chief Complaint: left knee pain HPI: Patient is here for his H&P. Patient is scheduled for a left total knee replacement by Dr. [name] on 09/04/23 at [facility].  Dr. [name] and the patient mutually agreed to proceed with a total knee replacement. Risks and benefits of the procedure were discussed including stiffness, suboptimal range of motion, persistent pain, infection requiring removal of prosthesis and reinsertion, need for prophylactic antibiotics in the future, for example, dental procedures, possible need for manipulation, revision in the future and also anesthetic complications including DVT, PE, etc. We discussed the perioperative course, time in the hospital, postoperative recovery and the need for elevation to control swelling. We also discussed the predicted range of motion and the probability that squatting and kneeling would be unobtainable in the future. In addition, postoperative anticoagulation was discussed. We have obtained preoperative medical clearance as necessary. Provided illustrated handout and discussed it in detail. They will enroll in the total joint replacement educational forum at the hospital.  He is scheduled for preop at [facility] on December 13. Not currently taking aspirin, NSAIDs, supplements or vitamins.  Past Medical History:  Diagnosis Date   Chronic rhinitis    followed by dr Irena Cords   GERD (gastroesophageal reflux disease)    Herniated lumbar intervertebral disc    not a problem presently   History of epistaxis    History of kidney stones    History of renal cell carcinoma 08/2016   urologist--- dr Marlou Porch;   s/p right partial nephrecotmy for papillary RCC, no chemo / radiation  (previously seen by oncology--- dr Bertis Ruddy, lov note in epic 04-04-2017)   Hypertension    Incisional hernia    Nocturia    OA (osteoarthritis)    knees   Seasonal allergies    Type 2 diabetes mellitus (HCC)    followed  by pcp---  (11-27-2021  per pt check blood sugar twice daily, fasting sugar---112--140)   Varicose vein of leg     Past Surgical History:  Procedure Laterality Date   CYSTECTOMY     per pt as child had a cyst removed that had was deep in chest wall   EXCISION MASS NECK  10/2011   posterior neck   INCISIONAL HERNIA REPAIR N/A 11/29/2021   Procedure: OPEN INCISIONAL HERNIA REPAIR WITH MESH;  Surgeon: Abigail Miyamoto, MD;  Location: [facility];  Service: General;  Laterality: N/A;   KNEE ARTHROSCOPY W/ MENISCAL REPAIR Bilateral 03/26/2011   @[facility]  by dr [name],  left knee//    right knee in 2017   MASS EXCISION N/A 09/11/2017   Procedure: MINOR EXCISION OF BACK MASS, EXCISION OF GLUTEAL MASS;  Surgeon: Axel Filler, MD;  Location: [facility];  Service: General;  Laterality: N/A;   ROBOTIC ASSITED PARTIAL NEPHRECTOMY Right 09/07/2016   Procedure: XI ROBOTIC ASSITED LAPAROSCOPIC RIGHT PARTIAL NEPHRECTOMY;  Surgeon: Crist Fat, MD;  Location: [facility];  Service: Urology;  Laterality: Right;   TONSILLECTOMY     child   VARICOSE VEIN SURGERY Bilateral 01/2013   per pt stripping    Family History  Problem Relation Age of Onset   Stroke Father        age 37   Cancer Brother        Hodgkin   HIV/AIDS Brother        age 13    Colon cancer Neg Hx    Esophageal cancer Neg Hx  Rectal cancer Neg Hx    Stomach cancer Neg Hx    Social History:  reports that he has been smoking cigars. He has never used smokeless tobacco. He reports that he does not currently use alcohol. He reports that he does not use drugs.  Allergies:  Allergies  Allergen Reactions   Mixed Ragweed Other (See Comments)    Aggravate allergies   Other Itching    Foods - cantaloupes, tomatoes, bananas, and  raw carrots.   Penicillins Itching    Has patient had a PCN reaction causing immediate rash, facial/tongue/throat swelling, SOB or lightheadedness with hypotension: No Has patient had a PCN  reaction causing severe rash involving mucus membranes or skin necrosis: No Has patient had a PCN reaction that required hospitalization: No Has patient had a PCN reaction occurring within the last 10 years: No If all of the above answers are "NO", then may proceed with Cephalosporin use.    Current meds: Flomax 0.4 mg capsule hydroCHLOROthiazide 12.5 mg capsule metFORMIN 500 mg tablet rosuvastatin 10 mg tablet  Review of Systems  Constitutional: Negative.   HENT: Negative.    Eyes: Negative.   Respiratory: Negative.    Cardiovascular: Negative.   Gastrointestinal: Negative.   Endocrine: Negative.   Genitourinary: Negative.   Musculoskeletal:  Positive for arthralgias, gait problem, joint swelling and myalgias.  Skin: Negative.   Hematological: Negative.   Psychiatric/Behavioral: Negative.      There were no vitals taken for this visit. Physical Exam Constitutional:      Appearance: Normal appearance.  HENT:     Head: Normocephalic and atraumatic.     Right Ear: External ear normal.     Left Ear: External ear normal.     Nose: Nose normal.     Mouth/Throat:     Pharynx: Oropharynx is clear.  Eyes:     Conjunctiva/sclera: Conjunctivae normal.  Cardiovascular:     Rate and Rhythm: Normal rate and regular rhythm.     Pulses: Normal pulses.     Heart sounds: Normal heart sounds.  Pulmonary:     Effort: Pulmonary effort is normal.     Breath sounds: Normal breath sounds.  Abdominal:     General: Bowel sounds are normal.  Musculoskeletal:     Cervical back: Normal range of motion.     Comments: On exam is tender to palpation medial joint line bilaterally. Patellofemoral pain compression bilaterally. Ranges 0-1 30 bilaterally. No DVT  Skin:    General: Skin is warm and dry.  Neurological:     Mental Status: He is alert.      Assessment/Plan Impression: Left knee end-stage osteoarthritis refractory to conservative treatment  Plan: Pt with end-stage left knee DJD,  bone-on-bone, refractory to conservative tx, scheduled for left total knee replacement by Dr. [name] on December 18. We again discussed the procedure itself as well as risks, complications and alternatives, including but not limited to DVT, PE, infx, bleeding, failure of procedure, need for secondary procedure including manipulation, nerve injury, ongoing pain/symptoms, anesthesia risk, even stroke or death. Also discussed typical post-op protocols, activity restrictions, need for PT, flexion/extension exercises, time out of work. Discussed need for DVT ppx post-op per protocol. Discussed dental ppx and infx prevention. Also discussed limitations post-operatively such as kneeling and squatting. All questions were answered. Patient desires to proceed with surgery as scheduled.  Will hold supplements, ASA and NSAIDs accordingly. Will remain NPO after midnight the night before surgery. Will present to [facility] for pre-op testing.  Anticipate hospital stay to include at least 2 midnights given medical history and to ensure proper pain control. Plan aspirin for DVT ppx post-op. Plan oxycodone Robaxin, Colace, Miralax. Plan home with HHPT post-op with family members at home for assistance. Will follow up 10-14 days post-op for staple removal and xrays.  Plan left total knee replacement  Dorothy Spark, PA-C for Dr [name] 08/30/2023, 8:27 AM

## 2023-09-04 ENCOUNTER — Ambulatory Visit (HOSPITAL_COMMUNITY): Payer: No Typology Code available for payment source

## 2023-09-04 ENCOUNTER — Ambulatory Visit (HOSPITAL_BASED_OUTPATIENT_CLINIC_OR_DEPARTMENT_OTHER): Payer: No Typology Code available for payment source | Admitting: Anesthesiology

## 2023-09-04 ENCOUNTER — Ambulatory Visit (HOSPITAL_COMMUNITY): Payer: No Typology Code available for payment source | Admitting: Anesthesiology

## 2023-09-04 ENCOUNTER — Other Ambulatory Visit: Payer: Self-pay

## 2023-09-04 ENCOUNTER — Ambulatory Visit (HOSPITAL_COMMUNITY)
Admission: RE | Admit: 2023-09-04 | Discharge: 2023-09-05 | Disposition: A | Discharge status: Home / Self Care | Payer: No Typology Code available for payment source | Attending: Specialist | Admitting: Specialist

## 2023-09-04 ENCOUNTER — Encounter (HOSPITAL_COMMUNITY): Payer: Self-pay | Admitting: Specialist

## 2023-09-04 ENCOUNTER — Encounter (HOSPITAL_COMMUNITY)
Admission: RE | Disposition: A | Discharge status: Home / Self Care | Payer: Self-pay | Source: Home / Self Care | Attending: Specialist

## 2023-09-04 DIAGNOSIS — I10 Essential (primary) hypertension: Secondary | ICD-10-CM | POA: Diagnosis not present

## 2023-09-04 DIAGNOSIS — K219 Gastro-esophageal reflux disease without esophagitis: Secondary | ICD-10-CM | POA: Insufficient documentation

## 2023-09-04 DIAGNOSIS — F17290 Nicotine dependence, other tobacco product, uncomplicated: Secondary | ICD-10-CM | POA: Insufficient documentation

## 2023-09-04 DIAGNOSIS — M1712 Unilateral primary osteoarthritis, left knee: Secondary | ICD-10-CM | POA: Diagnosis present

## 2023-09-04 DIAGNOSIS — E119 Type 2 diabetes mellitus without complications: Secondary | ICD-10-CM | POA: Diagnosis not present

## 2023-09-04 DIAGNOSIS — M21162 Varus deformity, not elsewhere classified, left knee: Secondary | ICD-10-CM | POA: Insufficient documentation

## 2023-09-04 DIAGNOSIS — M1711 Unilateral primary osteoarthritis, right knee: Secondary | ICD-10-CM | POA: Diagnosis present

## 2023-09-04 HISTORY — PX: TOTAL KNEE ARTHROPLASTY: SHX125

## 2023-09-04 LAB — GLUCOSE, CAPILLARY
Glucose-Capillary: 128 mg/dL — ABNORMAL HIGH (ref 70–99)
Glucose-Capillary: 111 mg/dL — ABNORMAL HIGH (ref 70–99)
Glucose-Capillary: 109 mg/dL — ABNORMAL HIGH (ref 70–99)
Glucose-Capillary: 263 mg/dL — ABNORMAL HIGH (ref 70–99)

## 2023-09-04 SURGERY — ARTHROPLASTY, KNEE, TOTAL
Anesthesia: Monitor Anesthesia Care | Site: Knee | Laterality: Left

## 2023-09-04 MED ORDER — PHENYLEPHRINE 80 MCG/ML (10ML) SYRINGE FOR IV PUSH (FOR BLOOD PRESSURE SUPPORT)
PREFILLED_SYRINGE | INTRAVENOUS | Status: AC
Start: 2023-09-04 — End: ?
  Filled 2023-09-04: qty 10

## 2023-09-04 MED ORDER — CHLORHEXIDINE GLUCONATE 0.12 % MT SOLN: 15.0000 mL | Freq: Once | OROMUCOSAL | Status: DC

## 2023-09-04 MED ORDER — FAMOTIDINE 20 MG PO TABS: 20.0000 mg | ORAL_TABLET | Freq: Every day | ORAL | Status: DC

## 2023-09-04 MED ORDER — TAMSULOSIN HCL 0.4 MG PO CAPS: 0.4000 mg | ORAL_CAPSULE | Freq: Every day | ORAL | Status: DC

## 2023-09-04 MED ORDER — ONDANSETRON HCL 4 MG/2ML IJ SOLN: 4.0000 mg | Freq: Four times a day (QID) | INTRAMUSCULAR | Status: DC | PRN

## 2023-09-04 MED ORDER — HYDROCHLOROTHIAZIDE 12.5 MG PO TABS
12.5000 mg | ORAL_TABLET | Freq: Every day | ORAL | Status: DC
  Administered 2023-09-05: 12.5 mg via ORAL
  Filled 2023-09-04: qty 1

## 2023-09-04 MED ORDER — METHOCARBAMOL 500 MG PO TABS
500.0000 mg | ORAL_TABLET | Freq: Four times a day (QID) | ORAL | Status: DC | PRN
  Administered 2023-09-04 – 2023-09-05 (×2): 500 mg via ORAL
  Filled 2023-09-04 (×2): qty 1

## 2023-09-04 MED ORDER — ASPIRIN 81 MG PO CHEW
81.0000 mg | CHEWABLE_TABLET | Freq: Two times a day (BID) | ORAL | Status: DC
  Administered 2023-09-05: 81 mg via ORAL
  Filled 2023-09-04: qty 1

## 2023-09-04 MED ORDER — SODIUM CHLORIDE 0.9 % IV SOLN: INTRAVENOUS | Status: DC | PRN

## 2023-09-04 MED ORDER — OXYCODONE HCL 5 MG/5ML PO SOLN: 5.0000 mg | Freq: Once | ORAL | Status: DC | PRN

## 2023-09-04 MED ORDER — TRIAMCINOLONE ACETONIDE 55 MCG/ACT NA AERO
2.0000 | INHALATION_SPRAY | Freq: Every day | NASAL | Status: DC
Start: 2023-09-05 — End: 2023-09-05
  Administered 2023-09-05: 2 via NASAL
  Filled 2023-09-04: qty 21.6

## 2023-09-04 MED ORDER — BUPIVACAINE LIPOSOME 1.3 % IJ SUSP
INTRAMUSCULAR | Status: DC | PRN
  Administered 2023-09-04: 20 mL

## 2023-09-04 MED ORDER — ASPIRIN 81 MG PO TBEC: 81.0000 mg | DELAYED_RELEASE_TABLET | Freq: Two times a day (BID) | ORAL | 1 refills | Status: AC

## 2023-09-04 MED ORDER — FENTANYL CITRATE PF 50 MCG/ML IJ SOSY
50.0000 ug | PREFILLED_SYRINGE | INTRAMUSCULAR | Status: DC
  Administered 2023-09-04: 50 ug via INTRAVENOUS
  Filled 2023-09-04 (×2): qty 2

## 2023-09-04 MED ORDER — TRANEXAMIC ACID-NACL 1000-0.7 MG/100ML-% IV SOLN
1000.0000 mg | INTRAVENOUS | Status: AC
  Administered 2023-09-04: 1000 mg via INTRAVENOUS
  Filled 2023-09-04: qty 100

## 2023-09-04 MED ORDER — ROPIVACAINE HCL 5 MG/ML IJ SOLN
INTRAMUSCULAR | Status: DC | PRN
  Administered 2023-09-04: 25 mL via PERINEURAL

## 2023-09-04 MED ORDER — PHENYLEPHRINE HCL-NACL 20-0.9 MG/250ML-% IV SOLN
INTRAVENOUS | Status: DC | PRN
  Administered 2023-09-04: 25 ug/min via INTRAVENOUS

## 2023-09-04 MED ORDER — POLYETHYLENE GLYCOL 3350 17 G PO PACK
17.0000 g | PACK | Freq: Every day | ORAL | Status: DC | PRN
Start: 2023-09-04 — End: 2023-09-05

## 2023-09-04 MED ORDER — SALINE SPRAY 0.65 % NA SOLN: 1.0000 | NASAL | Status: DC | PRN

## 2023-09-04 MED ORDER — ALUM & MAG HYDROXIDE-SIMETH 200-200-20 MG/5ML PO SUSP: 30.0000 mL | ORAL | Status: DC | PRN

## 2023-09-04 MED ORDER — PROPOFOL 500 MG/50ML IV EMUL
INTRAVENOUS | Status: DC | PRN
  Administered 2023-09-04: 30 mg via INTRAVENOUS
  Administered 2023-09-04: 150 ug/kg/min via INTRAVENOUS

## 2023-09-04 MED ORDER — BUPIVACAINE LIPOSOME 1.3 % IJ SUSP
INTRAMUSCULAR | Status: AC
  Filled 2023-09-04: qty 20

## 2023-09-04 MED ORDER — SODIUM CHLORIDE 0.9 % IR SOLN
Status: DC | PRN
  Administered 2023-09-04: 1000 mL
  Administered 2023-09-04: 250 mL

## 2023-09-04 MED ORDER — SODIUM CHLORIDE 0.9% FLUSH
INTRAVENOUS | Status: DC | PRN
  Administered 2023-09-04: 40 mL

## 2023-09-04 MED ORDER — ACETAMINOPHEN 10 MG/ML IV SOLN
1000.0000 mg | INTRAVENOUS | Status: AC
  Administered 2023-09-04: 1000 mg via INTRAVENOUS
  Filled 2023-09-04: qty 100

## 2023-09-04 MED ORDER — INSULIN ASPART 100 UNIT/ML IJ SOLN: 0.0000 [IU] | INTRAMUSCULAR | Status: DC | PRN

## 2023-09-04 MED ORDER — BUPIVACAINE IN DEXTROSE 0.75-8.25 % IT SOLN
INTRATHECAL | Status: DC | PRN
  Administered 2023-09-04: 1.7 mL via INTRATHECAL

## 2023-09-04 MED ORDER — LACTATED RINGERS IV SOLN: INTRAVENOUS | Status: DC

## 2023-09-04 MED ORDER — BISACODYL 5 MG PO TBEC: 5.0000 mg | DELAYED_RELEASE_TABLET | Freq: Every day | ORAL | Status: DC | PRN

## 2023-09-04 MED ORDER — METHOCARBAMOL 1000 MG/10ML IJ SOLN: 500.0000 mg | Freq: Four times a day (QID) | INTRAMUSCULAR | Status: DC | PRN

## 2023-09-04 MED ORDER — ONDANSETRON HCL 4 MG/2ML IJ SOLN
4.0000 mg | Freq: Four times a day (QID) | INTRAMUSCULAR | Status: DC | PRN
Start: 2023-09-04 — End: 2023-09-05

## 2023-09-04 MED ORDER — BUPIVACAINE-EPINEPHRINE 0.25% -1:200000 IJ SOLN
INTRAMUSCULAR | Status: AC
  Filled 2023-09-04: qty 1

## 2023-09-04 MED ORDER — OXYCODONE HCL 5 MG PO TABS: 5.0000 mg | ORAL_TABLET | Freq: Once | ORAL | Status: DC | PRN

## 2023-09-04 MED ORDER — LORATADINE 10 MG PO TABS
10.0000 mg | ORAL_TABLET | Freq: Every day | ORAL | Status: DC
  Administered 2023-09-05: 10 mg via ORAL
  Filled 2023-09-04: qty 1

## 2023-09-04 MED ORDER — ORAL CARE MOUTH RINSE: 15.0000 mL | Freq: Once | OROMUCOSAL | Status: DC

## 2023-09-04 MED ORDER — DEXAMETHASONE SODIUM PHOSPHATE 10 MG/ML IJ SOLN
INTRAMUSCULAR | Status: DC | PRN
  Administered 2023-09-04: 10 mg via INTRAVENOUS

## 2023-09-04 MED ORDER — OXYCODONE HCL 5 MG PO TABS: 5.0000 mg | ORAL_TABLET | ORAL | 0 refills | Status: DC | PRN

## 2023-09-04 MED ORDER — CEFAZOLIN SODIUM-DEXTROSE 2-4 GM/100ML-% IV SOLN
2.0000 g | INTRAVENOUS | Status: AC
Start: 2023-09-04 — End: 2023-09-04
  Administered 2023-09-04: 2 g via INTRAVENOUS
  Filled 2023-09-04: qty 100

## 2023-09-04 MED ORDER — DOCUSATE SODIUM 100 MG PO CAPS: 100.0000 mg | ORAL_CAPSULE | Freq: Two times a day (BID) | ORAL | 2 refills | Status: DC

## 2023-09-04 MED ORDER — MIDAZOLAM HCL 2 MG/2ML IJ SOLN
INTRAMUSCULAR | Status: AC
  Filled 2023-09-04: qty 2

## 2023-09-04 MED ORDER — ONDANSETRON HCL 4 MG/2ML IJ SOLN
INTRAMUSCULAR | Status: DC | PRN
  Administered 2023-09-04: 4 mg via INTRAVENOUS

## 2023-09-04 MED ORDER — PHENOL 1.4 % MT LIQD: 1.0000 | OROMUCOSAL | Status: DC | PRN

## 2023-09-04 MED ORDER — MIDAZOLAM HCL 2 MG/2ML IJ SOLN
1.0000 mg | INTRAMUSCULAR | Status: DC
  Administered 2023-09-04: 2 mg via INTRAVENOUS
  Filled 2023-09-04 (×2): qty 2

## 2023-09-04 MED ORDER — KCL IN DEXTROSE-NACL 20-5-0.45 MEQ/L-%-% IV SOLN
INTRAVENOUS | Status: DC
  Filled 2023-09-04: qty 1000

## 2023-09-04 MED ORDER — STERILE WATER FOR IRRIGATION IR SOLN
Status: DC | PRN
  Administered 2023-09-04: 1000 mL

## 2023-09-04 MED ORDER — ACETAMINOPHEN 500 MG PO TABS
1000.0000 mg | ORAL_TABLET | Freq: Four times a day (QID) | ORAL | Status: AC
  Administered 2023-09-04 – 2023-09-05 (×4): 1000 mg via ORAL
  Filled 2023-09-04 (×5): qty 2

## 2023-09-04 MED ORDER — DOCUSATE SODIUM 100 MG PO CAPS
100.0000 mg | ORAL_CAPSULE | Freq: Two times a day (BID) | ORAL | Status: DC
  Administered 2023-09-04 – 2023-09-05 (×2): 100 mg via ORAL
  Filled 2023-09-04 (×2): qty 1

## 2023-09-04 MED ORDER — DEXAMETHASONE SODIUM PHOSPHATE 10 MG/ML IJ SOLN
INTRAMUSCULAR | Status: AC
  Filled 2023-09-04: qty 1

## 2023-09-04 MED ORDER — DIPHENHYDRAMINE HCL 12.5 MG/5ML PO ELIX: 12.5000 mg | ORAL_SOLUTION | ORAL | Status: DC | PRN

## 2023-09-04 MED ORDER — OXYCODONE HCL 5 MG PO TABS
5.0000 mg | ORAL_TABLET | ORAL | Status: DC | PRN
  Administered 2023-09-04: 5 mg via ORAL
  Filled 2023-09-04: qty 1

## 2023-09-04 MED ORDER — METOCLOPRAMIDE HCL 5 MG PO TABS: 5.0000 mg | ORAL_TABLET | Freq: Three times a day (TID) | ORAL | Status: DC | PRN

## 2023-09-04 MED ORDER — BUPIVACAINE-EPINEPHRINE 0.25% -1:200000 IJ SOLN
INTRAMUSCULAR | Status: DC | PRN
  Administered 2023-09-04: 16 mL

## 2023-09-04 MED ORDER — SIMVASTATIN 5 MG PO TABS
5.0000 mg | ORAL_TABLET | Freq: Every day | ORAL | Status: DC
  Administered 2023-09-04: 5 mg via ORAL
  Filled 2023-09-04 (×2): qty 1

## 2023-09-04 MED ORDER — METHOCARBAMOL 500 MG PO TABS: 500.0000 mg | ORAL_TABLET | Freq: Three times a day (TID) | ORAL | 1 refills | Status: DC | PRN

## 2023-09-04 MED ORDER — POLYETHYLENE GLYCOL 3350 17 G PO PACK: 17.0000 g | PACK | Freq: Every day | ORAL | 0 refills | Status: AC

## 2023-09-04 MED ORDER — SODIUM CHLORIDE (PF) 0.9 % IJ SOLN
INTRAMUSCULAR | Status: AC
  Filled 2023-09-04: qty 50

## 2023-09-04 MED ORDER — HYDROMORPHONE HCL 1 MG/ML IJ SOLN: 0.5000 mg | INTRAMUSCULAR | Status: DC | PRN

## 2023-09-04 MED ORDER — 0.9 % SODIUM CHLORIDE (POUR BTL) OPTIME
TOPICAL | Status: DC | PRN
  Administered 2023-09-04: 1000 mL

## 2023-09-04 MED ORDER — CEFAZOLIN SODIUM-DEXTROSE 2-4 GM/100ML-% IV SOLN
2.0000 g | Freq: Four times a day (QID) | INTRAVENOUS | Status: AC
  Administered 2023-09-04 – 2023-09-05 (×2): 2 g via INTRAVENOUS
  Filled 2023-09-04 (×2): qty 100

## 2023-09-04 MED ORDER — RISAQUAD PO CAPS
1.0000 | ORAL_CAPSULE | Freq: Every day | ORAL | Status: DC
  Administered 2023-09-04: 1 via ORAL
  Filled 2023-09-04: qty 1

## 2023-09-04 MED ORDER — INSULIN ASPART 100 UNIT/ML IJ SOLN
0.0000 [IU] | Freq: Three times a day (TID) | INTRAMUSCULAR | Status: DC
  Administered 2023-09-05: 2 [IU] via SUBCUTANEOUS
  Administered 2023-09-05: 5 [IU] via SUBCUTANEOUS

## 2023-09-04 MED ORDER — ONDANSETRON HCL 4 MG PO TABS: 4.0000 mg | ORAL_TABLET | Freq: Four times a day (QID) | ORAL | Status: DC | PRN

## 2023-09-04 MED ORDER — ONDANSETRON HCL 4 MG/2ML IJ SOLN
INTRAMUSCULAR | Status: AC
Start: 2023-09-04 — End: ?
  Filled 2023-09-04: qty 2

## 2023-09-04 MED ORDER — MAGNESIUM CITRATE PO SOLN
1.0000 | Freq: Once | ORAL | Status: DC | PRN
Start: 2023-09-04 — End: 2023-09-05

## 2023-09-04 MED ORDER — ADULT MULTIVITAMIN W/MINERALS CH: 1.0000 | ORAL_TABLET | Freq: Every day | ORAL | Status: DC

## 2023-09-04 MED ORDER — OXYCODONE HCL 5 MG PO TABS
10.0000 mg | ORAL_TABLET | ORAL | Status: DC | PRN
  Administered 2023-09-04: 10 mg via ORAL
  Administered 2023-09-05 (×2): 15 mg via ORAL
  Filled 2023-09-04 (×2): qty 3
  Filled 2023-09-04: qty 2

## 2023-09-04 MED ORDER — FENTANYL CITRATE PF 50 MCG/ML IJ SOSY: 25.0000 ug | PREFILLED_SYRINGE | INTRAMUSCULAR | Status: DC | PRN

## 2023-09-04 MED ORDER — METOCLOPRAMIDE HCL 5 MG/ML IJ SOLN: 5.0000 mg | Freq: Three times a day (TID) | INTRAMUSCULAR | Status: DC | PRN

## 2023-09-04 MED ORDER — COQ10 100 MG PO CAPS: 100.0000 mg | ORAL_CAPSULE | Freq: Every day | ORAL | Status: DC

## 2023-09-04 MED ORDER — MENTHOL 3 MG MT LOZG: 1.0000 | LOZENGE | OROMUCOSAL | Status: DC | PRN

## 2023-09-04 MED ORDER — EPINEPHRINE 0.3 MG/0.3ML IJ SOAJ: 0.3000 mg | INTRAMUSCULAR | Status: DC | PRN

## 2023-09-04 SURGICAL SUPPLY — 63 items
ATTUNE PS FEM LT SZ 7 CEM KNEE (Femur) IMPLANT
ATTUNE PSRP INSR SZ7 7 KNEE (Insert) IMPLANT
BAG COUNTER SPONGE SURGICOUNT (BAG) IMPLANT
BAG DECANTER FOR FLEXI CONT (MISCELLANEOUS) ×1 IMPLANT
BAG ZIPLOCK 12X15 (MISCELLANEOUS) IMPLANT
BASE TIBIAL ROT PLAT SZ 8 KNEE (Knees) IMPLANT
BLADE SAW SGTL 11.0X1.19X90.0M (BLADE) ×1 IMPLANT
BLADE SAW SGTL 13.0X1.19X90.0M (BLADE) ×1 IMPLANT
BLADE SURG SZ10 CARB STEEL (BLADE) ×2 IMPLANT
BNDG ELASTIC 4INX 5YD STR LF (GAUZE/BANDAGES/DRESSINGS) ×1 IMPLANT
BNDG ELASTIC 6INX 5YD STR LF (GAUZE/BANDAGES/DRESSINGS) ×1 IMPLANT
BOWL SMART MIX CTS (DISPOSABLE) ×1 IMPLANT
CEMENT HV SMART SET (Cement) ×2 IMPLANT
COVER SURGICAL LIGHT HANDLE (MISCELLANEOUS) ×1 IMPLANT
CUFF TRNQT CYL 34X4.125X (TOURNIQUET CUFF) ×1 IMPLANT
DRAPE INCISE IOBAN 66X45 STRL (DRAPES) IMPLANT
DRAPE SHEET LG 3/4 BI-LAMINATE (DRAPES) ×1 IMPLANT
DRAPE SURG ORHT 6 SPLT 77X108 (DRAPES) ×2 IMPLANT
DRAPE TOP 10253 STERILE (DRAPES) ×1 IMPLANT
DRAPE U-SHAPE 47X51 STRL (DRAPES) ×1 IMPLANT
DRSG AQUACEL AG ADV 3.5X10 (GAUZE/BANDAGES/DRESSINGS) ×1 IMPLANT
DRSG TEGADERM 4X4.75 (GAUZE/BANDAGES/DRESSINGS) IMPLANT
DURAPREP 26ML APPLICATOR (WOUND CARE) ×1 IMPLANT
ELECT BLADE TIP CTD 4 INCH (ELECTRODE) ×1 IMPLANT
ELECT REM PT RETURN 15FT ADLT (MISCELLANEOUS) ×1 IMPLANT
EVACUATOR 1/8 PVC DRAIN (DRAIN) IMPLANT
GAUZE SPONGE 2X2 8PLY STRL LF (GAUZE/BANDAGES/DRESSINGS) IMPLANT
GLOVE BIO SURGEON STRL SZ7 (GLOVE) ×1 IMPLANT
GLOVE BIOGEL PI IND STRL 7.0 (GLOVE) ×1 IMPLANT
GLOVE BIOGEL PI IND STRL 8 (GLOVE) ×1 IMPLANT
GLOVE SURG SS PI 8.0 STRL IVOR (GLOVE) ×1 IMPLANT
GOWN STRL REUS W/ TWL XL LVL3 (GOWN DISPOSABLE) ×2 IMPLANT
HEMOSTAT SPONGE AVITENE ULTRA (HEMOSTASIS) IMPLANT
HOLDER FOLEY CATH W/STRAP (MISCELLANEOUS) IMPLANT
IMMOBILIZER KNEE 20 (SOFTGOODS) ×1
IMMOBILIZER KNEE 20 THIGH 36 (SOFTGOODS) ×1 IMPLANT
KIT TURNOVER KIT A (KITS) IMPLANT
MANIFOLD NEPTUNE II (INSTRUMENTS) ×1 IMPLANT
NS IRRIG 1000ML POUR BTL (IV SOLUTION) IMPLANT
PACK TOTAL KNEE CUSTOM (KITS) ×1 IMPLANT
PATELLA MEDIAL ATTUN 35MM KNEE (Knees) IMPLANT
PIN STEINMAN FIXATION KNEE (PIN) IMPLANT
PROTECTOR NERVE ULNAR (MISCELLANEOUS) ×1 IMPLANT
SAW OSC TIP CART 19.5X105X1.3 (SAW) IMPLANT
SEALER BIPOLAR AQUA 6.0 (INSTRUMENTS) IMPLANT
SET HNDPC FAN SPRY TIP SCT (DISPOSABLE) ×1 IMPLANT
SOLUTION PRONTOSAN WOUND 350ML (IRRIGATION / IRRIGATOR) ×1 IMPLANT
SPIKE FLUID TRANSFER (MISCELLANEOUS) ×1 IMPLANT
STAPLER VISISTAT (STAPLE) IMPLANT
STRIP CLOSURE SKIN 1/2X4 (GAUZE/BANDAGES/DRESSINGS) IMPLANT
SUT BONE WAX W31G (SUTURE) ×1 IMPLANT
SUT MNCRL AB 4-0 PS2 18 (SUTURE) IMPLANT
SUT STRATAFIX 0 PDS 27 VIOLET (SUTURE) ×1
SUT VIC AB 1 CT1 27XBRD ANTBC (SUTURE) ×3 IMPLANT
SUT VIC AB 2-0 CT1 TAPERPNT 27 (SUTURE) ×3 IMPLANT
SUTURE STRATFX 0 PDS 27 VIOLET (SUTURE) ×1 IMPLANT
SYR 3ML LL SCALE MARK (SYRINGE) IMPLANT
TIBIAL BASE ROT PLAT SZ 8 KNEE (Knees) ×1 IMPLANT
TRAY FOLEY MTR SLVR 16FR STAT (SET/KITS/TRAYS/PACK) ×1 IMPLANT
TUBE SUCTION HIGH CAP CLEAR NV (SUCTIONS) ×1 IMPLANT
WATER STERILE IRR 1000ML POUR (IV SOLUTION) ×1 IMPLANT
WIPE CHG 2% PREP (PERSONAL CARE ITEMS) ×1 IMPLANT
WRAP KNEE MAXI GEL POST OP (GAUZE/BANDAGES/DRESSINGS) ×1 IMPLANT

## 2023-09-04 NOTE — Brief Op Note (Signed)
09/04/2023  10:55 AM  PATIENT:  Michael Church  67 y.o. male  PRE-OPERATIVE DIAGNOSIS:  Left knee degenerative joint disease  POST-OPERATIVE DIAGNOSIS:  * No post-op diagnosis entered *  PROCEDURE:  Procedure(s): TOTAL KNEE ARTHROPLASTY (Left)  SURGEON:  Surgeons and Role:    Jene Every, MD - Primary The aquamantis was utilized for this case to help facilitate better hemostasis as patient was felt to be at increased risk of bleeding because of complex case requiring increased OR time and/or exposure.   PHYSICIAN ASSISTANT:   ASSISTANTS: Bissell   ANESTHESIA:   spinal      EBL:  50   BLOOD ADMINISTERED:none  DRAINS: none   LOCAL MEDICATIONS USED:  MARCAINE     SPECIMEN:  No Specimen  DISPOSITION OF SPECIMEN:  N/A  COUNTS:  YES TT  DICTATION: .Other Dictation: Dictation Number [id number]  PLAN OF CARE: Admit for overnight observation  PATIENT DISPOSITION:  PACU - hemodynamically stable.   Delay start of Pharmacological VTE agent (>24hrs) due to surgical blood loss or risk of bleeding: no

## 2023-09-04 NOTE — Transfer of Care (Signed)
Immediate Anesthesia Transfer of Care Note  Patient: Michael Church  Procedure(s) Performed: TOTAL KNEE ARTHROPLASTY (Left: Knee)  Patient Location: PACU  Anesthesia Type:MAC  Level of Consciousness: sedated  Airway & Oxygen Therapy: Patient Spontanous Breathing and Patient connected to face mask  Post-op Assessment: Report given to RN  Post vital signs: Reviewed and stable  Last Vitals:  Vitals Value Taken Time  BP 104/61 09/04/23 1415  Temp    Pulse 92 09/04/23 1416  Resp 10 09/04/23 1416  SpO2 99 % 09/04/23 1416  Vitals shown include unfiled device data.  Last Pain:  Vitals:   09/04/23 0941  TempSrc:   PainSc: 0-No pain         Complications: No notable events documented.

## 2023-09-04 NOTE — Op Note (Unsigned)
NAME: Michael Church, Michael Church MEDICAL RECORD NO: [id number] ACCOUNT NO: [id number] DATE OF BIRTH: 04/20/1956 FACILITY: [facility] LOCATION: [facility] PHYSICIAN: Javier Docker, MD  Operative Report   DATE OF PROCEDURE: 09/04/2023  PREOPERATIVE DIAGNOSIS:  End-stage osteoarthrosis, varus deformity of the left knee.  POSTOPERATIVE DIAGNOSIS:  End-stage osteoarthrosis, varus deformity of the left knee.  PROCEDURE PERFORMED:  Left total knee arthroplasty utilizing Attune rotating platform 7 femur, 8 tibia, 7 mm insert.  38 patella.  ANESTHESIA:  Spinal.  ASSISTANT:  Lanna Poche, PA.  HISTORY:  A 67 year old with end-stage osteoarthrosis, varus deformity, refractory to conservative treatment indicated for placement of a degenerated joint.  Risks and benefits discussed including bleeding, infection, damage to neurovascular structures,  no change in symptoms, worsening symptoms, DVT, PE, anesthetic complications, etc.  DESCRIPTION OF PROCEDURE:  The patient in the supine position after induction of adequate spinal anesthesia and 2 g of Kefzol, the left lower extremity was prepped and draped and exsanguinated in usual sterile fashion.  The thigh tourniquet was inflated  to 225 mmHg.  A midline incision was made over the knee.  Full-thickness flaps developed, median parapatellar arthrotomy performed.  Soft tissue was elevated medially, preserving the MCL.  Patella gently everted and knee flexed.  Tricompartmental  osteoarthrosis was noted, bone-on-bone medial compartment.  Remnants of medial and lateral menisci and ACL were removed.  Leksell rongeur was utilized to perform a notch above the femoral notch for a starting hole for the femoral drill in line with the  femur.  This was then irrigated intramedullary, T-handle intramedullary guide at 5-degree left was then utilized with 9 off the distal femur, pinned, performed a distal femoral cut, then subluxed the tibia.  The low side was medially.  External  alignment  guide throughout the defect, which was medially, parallel to the shaft, 3-degree slope bisecting the tibiotalar joint.  Pinned, performed our cut.  Protecting the soft tissues posteriorly at all times as well as medially and laterally.  I then used an  extension block at 6 and it was satisfactory.  Then reflex the knee, measured the tibia.  I used an 8 base plate maximizing coverage.  This was then pinned.  I drilled centrally using our punch guide.  I then turned attention back to the femur, sized off  the anterior cortex to a 7.  A 3 degrees of external rotation, pinned, distal block applied.  I performed an anterior-posterior and chamfer cut, soft tissue protected posteriorly at all times.  I then performed a box cut with a guide bisecting the  condyles.  I then placed her trial femur and it sit flush with a 6 mm insert.  It is slight recurvatum, but good stability.  Drilled our lug holes.  Everted patella measured to a 23 plane to a 15 utilizing the patellar jig.  Measured to a 38 with a  paddle parallel to the joint surface, drilled a lug holes, placed a trial patella, reduced it, and had excellent patellofemoral tracking.  All instrumentation was removed, checked posteriorly.  Popliteus and capsule were intact.  Cauterized the  geniculates.  Pulsatile lavage was used to clean the bony surfaces.  The knee was flexed.  We used Exparel through the medial capsule, aspirating without a breaking the vacuum, and then injecting 10 mL and then anesthetized the medial and lateral  menisci, periosteum of the femur, proximal medial tibia, etc.  Tibia subluxed.  All surfaces thoroughly dried.  Mixed cement on the back table under centrifuge.  We then placed cement on the tibial tray and placed cement into the proximal tibia after drilling the pinholes in the eburnated bone medially.  After  digitally pressurizing the cement in the proximal tibia, I impacted the tibial tray.  I then cemented the femur  with cement on the femur and the femoral component and packed it into place, placed a 7 mm insert and reduced it into extension, held axial  load throughout the curing of the cement.  I everted the patella, cementing the patella, clamping it.  Marcaine with epinephrine, Prontosan was placed in the wound during the curing of the cement.  The wound was covered.  After curing the cement, the  tourniquet was deflated at 60 minutes and bleeding was cauterized.  I had good extension and good flexion.  Good stability to varus and valgus stress at 0 or 30 degrees.  Negative anterior drawer.  I flexed the knee, removed the 7 mm insert and  meticulously removed all redundant cement.  Copiously irrigated with pulsatile lavage followed by Prontosan, selected a 7-mm insert, inserted it, had full extension, full flexion, good stability with varus and valgus stressing at 0 and 30 degrees.   Negative anterior drawer.  Then in mid flexion, reapproximated patellar arthrotomy with 1-0 Vicryl interrupted figure-of-eight sutures.  Oversewn with a running Stratafix.  Had excellent patellofemoral tracking following this.  Copiously irrigated  subcutaneous tissue subcuticular with 2-0 and skin with staples.  He had flexion to gravity at 90 degrees.  Sterile dressing applied, placed in an immobilizer and transported to the recovery room in satisfactory condition.  The patient tolerated the procedure well.  No complications.  Assistant Lanna Poche, [place] is used throughout the case for patient positioning, traction, and closure.  BLOOD LOSS:  50 mL.   PUS D: 09/04/2023 1:57:54 pm T: 09/04/2023 3:58:00 pm  JOB: [id number]/ [id number]

## 2023-09-04 NOTE — Anesthesia Procedure Notes (Signed)
Anesthesia Regional Block: Adductor canal block   Pre-Anesthetic Checklist: , timeout performed,  Correct Patient, Correct Site, Correct Laterality,  Correct Procedure, Correct Position, site marked,  Risks and benefits discussed,  Surgical consent,  Pre-op evaluation,  At surgeon's request and post-op pain management  Laterality: Left  Prep: chloraprep       Needles:  Injection technique: Single-shot  Needle Type: Echogenic Needle     Needle Length: 9cm  Needle Gauge: 21     Additional Needles:   Narrative:  Start time: 09/04/2023 10:12 AM End time: 09/04/2023 10:17 AM Injection made incrementally with aspirations every 5 mL.  Performed by: Personally  Anesthesiologist: Achille Rich, MD  Additional Notes: Pt tolerated the procedure well.

## 2023-09-04 NOTE — Discharge Instructions (Signed)
Elevate leg above heart 6x a day for each Use knee immobilizer while walking until can SLR x 10 Use knee immobilizer in bed to keep knee in extension Aquacel dressing may remain in place for one week. May shower with aquacel dressing in place. If the dressing becomes saturated or peels off, you may remove aquacel dressing. Do not remove steri-strips if they are present. Place new dressing with gauze and tape or ACE bandage which should be kept clean and dry and changed daily.  INSTRUCTIONS AFTER JOINT REPLACEMENT   Remove items at home which could result in a fall. This includes throw rugs or furniture in walking pathways ICE to the affected joint every three hours while awake for 30 minutes at a time, for at least the first 3-5 days, and then as needed for pain and swelling.  Continue to use ice for pain and swelling. You may notice swelling that will progress down to the foot and ankle.  This is normal after surgery.  Elevate your leg when you are not up walking on it.   Continue to use the breathing machine you got in the hospital (incentive spirometer) which will help keep your temperature down.  It is common for your temperature to cycle up and down following surgery, especially at night when you are not up moving around and exerting yourself.  The breathing machine keeps your lungs expanded and your temperature down.   DIET:  As you were doing prior to hospitalization, we recommend a well-balanced diet.  DRESSING / WOUND CARE / SHOWERING  You may change your dressing 31 week after surgery.  Then change the dressing every day with sterile gauze.  Please use good hand washing techniques before changing the dressing.  Do not use any lotions or creams on the incision until instructed by your surgeon.  ACTIVITY  Increase activity slowly as tolerated, but follow the weight bearing instructions below.   No driving for 6 weeks or until further direction given by your physician.  You  cannot drive while taking narcotics.  No lifting or carrying greater than 10 lbs. until further directed by your surgeon. Avoid periods of inactivity such as sitting longer than an hour when not asleep. This helps prevent blood clots.  You may return to work once you are authorized by your doctor.     WEIGHT BEARING   Weight bearing as tolerated with assist device (walker, cane, etc) as directed, use it as long as suggested by your surgeon or therapist, typically at least 4-6 weeks.   EXERCISES  Results after joint replacement surgery are often greatly improved when you follow the exercise, range of motion and muscle strengthening exercises prescribed by your doctor. Safety measures are also important to protect the joint from further injury. Any time any of these exercises cause you to have increased pain or swelling, decrease what you are doing until you are comfortable again and then slowly increase them. If you have problems or questions, call your caregiver or physical therapist for advice.   Rehabilitation is important following a joint replacement. After just a few days of immobilization, the muscles of the leg can become weakened and shrink (atrophy).  These exercises are designed to build up the tone and strength of the thigh and leg muscles and to improve motion. Often times heat used for twenty to thirty minutes before working out will loosen up your tissues and help with improving the range of motion but do not use heat for  the first two weeks following surgery (sometimes heat can increase post-operative swelling).   These exercises can be done on a training (exercise) mat, on the floor, on a table or on a bed. Use whatever works the best and is most comfortable for you.    Use music or television while you are exercising so that the exercises are a pleasant break in your day. This will make your life better with the exercises acting as a break in your routine that you can look forward  to.   Perform all exercises about fifteen times, three times per day or as directed.  You should exercise both the operative leg and the other leg as well.  Exercises include:   Quad Sets - Tighten up the muscle on the front of the thigh (Quad) and hold for 5-10 seconds.   Straight Leg Raises - With your knee straight (if you were given a brace, keep it on), lift the leg to 60 degrees, hold for 3 seconds, and slowly lower the leg.  Perform this exercise against resistance later as your leg gets stronger.  Leg Slides: Lying on your back, slowly slide your foot toward your buttocks, bending your knee up off the floor (only go as far as is comfortable). Then slowly slide your foot back down until your leg is flat on the floor again.  Angel Wings: Lying on your back spread your legs to the side as far apart as you can without causing discomfort.  Hamstring Strength:  Lying on your back, push your heel against the floor with your leg straight by tightening up the muscles of your buttocks.  Repeat, but this time bend your knee to a comfortable angle, and push your heel against the floor.  You may put a pillow under the heel to make it more comfortable if necessary.   A rehabilitation program following joint replacement surgery can speed recovery and prevent re-injury in the future due to weakened muscles. Contact your doctor or a physical therapist for more information on knee rehabilitation.    CONSTIPATION  Constipation is defined medically as fewer than three stools per week and severe constipation as less than one stool per week.  Even if you have a regular bowel pattern at home, your normal regimen is likely to be disrupted due to multiple reasons following surgery.  Combination of anesthesia, postoperative narcotics, change in appetite and fluid intake all can affect your bowels.   YOU MUST use at least one of the following options; they are listed in order of increasing strength to get the job  done.  They are all available over the counter, and you may need to use some, POSSIBLY even all of these options:    Drink plenty of fluids (prune juice may be helpful) and high fiber foods Colace 100 mg by mouth twice a day  Senokot for constipation as directed and as needed Dulcolax (bisacodyl), take with full glass of water  Miralax (polyethylene glycol) once or twice a day as needed.  If you have tried all these things and are unable to have a bowel movement in the first 3-4 days after surgery call either your surgeon or your primary doctor.    If you experience loose stools or diarrhea, hold the medications until you stool forms back up.  If your symptoms do not get better within 1 week or if they get worse, check with your doctor.  If you experience "the worst abdominal pain ever" or develop nausea  or vomiting, please contact the office immediately for further recommendations for treatment.   ITCHING:  If you experience itching with your medications, try taking only a single pain pill, or even half a pain pill at a time.  You can also use Benadryl over the counter for itching or also to help with sleep.   TED HOSE STOCKINGS:  Use stockings on both legs until for at least 2 weeks or as directed by physician office. They may be removed at night for sleeping.  MEDICATIONS:  See your medication summary on the "After Visit Summary" that nursing will review with you.  You may have some home medications which will be placed on hold until you complete the course of blood thinner medication.  It is important for you to complete the blood thinner medication as prescribed.  PRECAUTIONS:  If you experience chest pain or shortness of breath - call 911 immediately for transfer to the hospital emergency department.   If you develop a fever greater that 101 F, purulent drainage from wound, increased redness or drainage from wound, foul odor from the wound/dressing, or calf pain - CONTACT YOUR SURGEON.                                                    FOLLOW-UP APPOINTMENTS:  If you do not already have a post-op appointment, please call the office for an appointment to be seen by your surgeon.  Guidelines for how soon to be seen are listed in your "After Visit Summary", but are typically between 1-4 weeks after surgery.  OTHER INSTRUCTIONS:   Knee Replacement:  Do not place pillow under knee, focus on keeping the knee straight while resting. CPM instructions: 0-90 degrees, 2 hours in the morning, 2 hours in the afternoon, and 2 hours in the evening. Place foam block, curve side up under heel at all times except when in CPM or when walking.  DO NOT modify, tear, cut, or change the foam block in any way.  POST-OPERATIVE OPIOID TAPER INSTRUCTIONS: It is important to wean off of your opioid medication as soon as possible. If you do not need pain medication after your surgery it is ok to stop day one. Opioids include: Codeine, Hydrocodone(Norco, Vicodin), Oxycodone(Percocet, oxycontin) and hydromorphone amongst others.  Long term and even short term use of opiods can cause: Increased pain response Dependence Constipation Depression Respiratory depression And more.  Withdrawal symptoms can include Flu like symptoms Nausea, vomiting And more Techniques to manage these symptoms Hydrate well Eat regular healthy meals Stay active Use relaxation techniques(deep breathing, meditating, yoga) Do Not substitute Alcohol to help with tapering If you have been on opioids for less than two weeks and do not have pain than it is ok to stop all together.  Plan to wean off of opioids This plan should start within one week post op of your joint replacement. Maintain the same interval or time between taking each dose and first decrease the dose.  Cut the total daily intake of opioids by one tablet each day Next start to increase the time between doses. The last dose that should be eliminated is the evening  dose.   MAKE SURE YOU:  Understand these instructions.  Get help right away if you are not doing well or get worse.    Thank you for letting  us be a part of your medical care team.  It is a privilege we respect greatly.  We hope these instructions will help you stay on track for a fast and full recovery!

## 2023-09-04 NOTE — Interval H&P Note (Signed)
History and Physical Interval Note:  09/04/2023 10:55 AM  Michael Church  has presented today for surgery, with the diagnosis of Left knee degenerative joint disease.  The various methods of treatment have been discussed with the patient and family. After consideration of risks, benefits and other options for treatment, the patient has consented to  Procedure(s): TOTAL KNEE ARTHROPLASTY (Left) as a surgical intervention.  The patient's history has been reviewed, patient examined, no change in status, stable for surgery.  I have reviewed the patient's chart and labs.  Questions were answered to the patient's satisfaction.     Javier Docker

## 2023-09-04 NOTE — Progress Notes (Signed)
PHARMACIST - PHYSICIAN ORDER COMMUNICATION  CONCERNING: P&T Medication Policy on Herbal Medications  DESCRIPTION:  This patient's order for:  CoQ10  has been noted.  This product(s) is classified as an "herbal" or natural product. Due to a lack of definitive safety studies or FDA approval, nonstandard manufacturing practices, plus the potential risk of unknown drug-drug interactions while on inpatient medications, the Pharmacy and Therapeutics Committee does not permit the use of "herbal" or natural products of this type within [facility].   ACTION TAKEN: The pharmacy department is unable to verify this order at this time and your patient has been informed of this safety policy. Please reevaluate patient's clinical condition at discharge and address if the herbal or natural product(s) should be resumed at that time.  Nan Maya, PharmD 

## 2023-09-04 NOTE — Anesthesia Preprocedure Evaluation (Signed)
Anesthesia Evaluation  Patient identified by MRN, date of birth, ID band Patient awake    Reviewed: Allergy & Precautions, H&P , NPO status , Patient's Chart, lab work & pertinent test results  Airway Mallampati: II   Neck ROM: full    Dental   Pulmonary Current Smoker   breath sounds clear to auscultation       Cardiovascular hypertension,  Rhythm:regular Rate:Normal     Neuro/Psych    GI/Hepatic hiatal hernia,GERD  ,,  Endo/Other  diabetes, Type 2    Renal/GU H/o RCC.  S/p right partial nephrectomy.     Musculoskeletal  (+) Arthritis ,    Abdominal   Peds  Hematology   Anesthesia Other Findings   Reproductive/Obstetrics                             Anesthesia Physical Anesthesia Plan  ASA: 2  Anesthesia Plan: Spinal and MAC   Post-op Pain Management: Regional block*   Induction: Intravenous  PONV Risk Score and Plan: 0 and Propofol infusion and Treatment may vary due to age or medical condition  Airway Management Planned: Simple Face Mask  Additional Equipment:   Intra-op Plan:   Post-operative Plan:   Informed Consent: I have reviewed the patients History and Physical, chart, labs and discussed the procedure including the risks, benefits and alternatives for the proposed anesthesia with the patient or authorized representative who has indicated his/her understanding and acceptance.     Dental advisory given  Plan Discussed with: CRNA, Anesthesiologist and Surgeon  Anesthesia Plan Comments:        Anesthesia Quick Evaluation

## 2023-09-04 NOTE — Anesthesia Procedure Notes (Signed)
Spinal  Patient location during procedure: OR Start time: 09/04/2023 11:48 AM End time: 09/04/2023 11:51 AM Reason for block: surgical anesthesia Staffing Performed: anesthesiologist  Anesthesiologist: Kaylyn Layer, MD Performed by: Kaylyn Layer, MD Authorized by: Kaylyn Layer, MD   Preanesthetic Checklist Completed: patient identified, IV checked, risks and benefits discussed, surgical consent, monitors and equipment checked, pre-op evaluation and timeout performed Spinal Block Patient position: sitting Prep: DuraPrep and site prepped and draped Patient monitoring: continuous pulse ox, blood pressure and heart rate Approach: midline Location: L3-4 Injection technique: single-shot Needle Needle type: Pencan  Needle gauge: 24 G Needle length: 9 cm Assessment Events: CSF return Additional Notes Risks, benefits, and alternative discussed. Patient gave consent to procedure. Prepped and draped in sitting position. Patient sedated but responsive to voice. Clear CSF obtained after one needle pass. Positive terminal aspiration. No pain or paraesthesias with injection. Patient tolerated procedure well. Vital signs stable. Amalia Greenhouse, MD

## 2023-09-04 NOTE — Anesthesia Postprocedure Evaluation (Signed)
Anesthesia Post Note  Patient: Michael Church  Procedure(s) Performed: TOTAL KNEE ARTHROPLASTY (Left: Knee)     Patient location during evaluation: PACU Anesthesia Type: Spinal Level of consciousness: awake and alert Pain management: pain level controlled Vital Signs Assessment: post-procedure vital signs reviewed and stable Respiratory status: spontaneous breathing, nonlabored ventilation and respiratory function stable Cardiovascular status: blood pressure returned to baseline Postop Assessment: no apparent nausea or vomiting, spinal receding, no headache and no backache Anesthetic complications: no   No notable events documented.  Last Vitals:  Vitals:   09/04/23 1500 09/04/23 1515  BP: 124/77 (!) 127/95  Pulse: 62 63  Resp: 14 18  Temp:    SpO2: 100% 100%    Last Pain:  Vitals:   09/04/23 1515  TempSrc:   PainSc: 0-No pain                 Shanda Howells

## 2023-09-04 NOTE — Evaluation (Signed)
Physical Therapy Evaluation Patient Details Name: Michael Church MRN: [id number] DOB: 1955/09/21 Today's Date: 09/04/2023  History of Present Illness  67 yo male presents to therapy s/p R TKA on 09/04/2023 due to failure of conservative measures. Pt PMH includes but is not limited to: GERD, lumbar herniated disc, renal cell carcinoma s/p partial R nephrectomy, HTN, OA, and DM II.  Clinical Impression   Michael Church is a 67 y.o. male POD 0 s/p R TKA. Patient reports IND with mobility at baseline. Patient is now limited by functional impairments (see PT problem list below) and requires max A for bed mobility and is unable to safely perform transfers or gait tasks at time of eval due to slow regression of anesthesia.  Patient will benefit from continued skilled PT interventions to address impairments and progress towards PLOF. Acute PT will follow to progress mobility and stair training in preparation for safe discharge home with family assist and [facility] services.       If plan is discharge home, recommend the following: A little help with walking and/or transfers;A little help with bathing/dressing/bathroom;Assistance with cooking/housework;Assist for transportation;Help with stairs or ramp for entrance   Can travel by private vehicle        Equipment Recommendations Rolling walker (2 wheels)  Recommendations for Other Services       Functional Status Assessment Patient has had a recent decline in their functional status and demonstrates the ability to make significant improvements in function in a reasonable and predictable amount of time.     Precautions / Restrictions Precautions Precautions: Knee;Fall Restrictions Weight Bearing Restrictions Per Provider Order: No      Mobility  Bed Mobility Overal bed mobility: Needs Assistance Bed Mobility: Supine to Sit, Sit to Supine     Supine to sit: Max assist, HOB elevated, Used rails Sit to supine: Max assist, Used rails    General bed mobility comments: pt limited due to poor motor control and coordination with deminished sensation B LE attributed to slow regression of anesthesia.    Transfers                   General transfer comment: NT due to slow regression of anesthesia    Ambulation/Gait               General Gait Details: NT  Stairs            Wheelchair Mobility     Tilt Bed    Modified Rankin (Stroke Patients Only)       Balance Overall balance assessment: Needs assistance Sitting-balance support: Feet supported Sitting balance-Leahy Scale: Good                                       Pertinent Vitals/Pain Pain Assessment Pain Assessment: 0-10 Pain Score: 0-No pain Pain Location: L knee and LE Pain Descriptors / Indicators:  (pt reports no pain) Pain Intervention(s): Limited activity within patient's tolerance, Monitored during session, Premedicated before session, Repositioned, Ice applied    Home Living Family/patient expects to be discharged to:: Private residence Living Arrangements: Spouse/significant other Available Help at Discharge: Family Type of Home: House Home Access: Stairs to enter Entrance Stairs-Rails: None (steeper steps in back of home with a handrail, in the front of the home no handrail steps less of a rise) Entrance Stairs-Number of Steps: 4   Home Layout: One level Home  Equipment: Gilmer Mor - single point      Prior Function Prior Level of Function : Independent/Modified Independent;Driving             Mobility Comments: IND no AD for all ADLs, self care tasks and IADLs       Extremity/Trunk Assessment        Lower Extremity Assessment Lower Extremity Assessment: LLE deficits/detail LLE Deficits / Details: L ankle DF/PF 5/5; SLR passive LLE Sensation: decreased light touch;decreased proprioception (ankles proximally)    Cervical / Trunk Assessment Cervical / Trunk Assessment: Normal  Communication    Communication Communication: No apparent difficulties  Cognition Arousal: Alert Behavior During Therapy: WFL for tasks assessed/performed Overall Cognitive Status: Within Functional Limits for tasks assessed                                          General Comments      Exercises     Assessment/Plan    PT Assessment Patient needs continued PT services  PT Problem List Decreased strength;Decreased range of motion;Decreased activity tolerance;Decreased balance;Decreased mobility;Decreased coordination       PT Treatment Interventions DME instruction;Gait training;Stair training;Functional mobility training;Therapeutic activities;Therapeutic exercise;Balance training;Neuromuscular re-education;Patient/family education;Modalities    PT Goals (Current goals can be found in the Care Plan section)  Acute Rehab PT Goals Patient Stated Goal: to be able to navigate uneven surfaces, walk without pain and get the R TKA done PT Goal Formulation: With patient Time For Goal Achievement: 09/18/23 Potential to Achieve Goals: Good    Frequency 7X/week     Co-evaluation               AM-PAC PT "6 Clicks" Mobility  Outcome Measure Help needed turning from your back to your side while in a flat bed without using bedrails?: A Lot Help needed moving from lying on your back to sitting on the side of a flat bed without using bedrails?: A Lot Help needed moving to and from a bed to a chair (including a wheelchair)?: Total Help needed standing up from a chair using your arms (e.g., wheelchair or bedside chair)?: Total Help needed to walk in hospital room?: Total Help needed climbing 3-5 steps with a railing? : Total 6 Click Score: 8    End of Session   Activity Tolerance: Patient tolerated treatment well;Treatment limited secondary to medical complications (Comment) (slow regression of anesthesia) Patient left: in bed;with call bell/phone within reach;with  family/visitor present Nurse Communication: Mobility status PT Visit Diagnosis: Unsteadiness on feet (R26.81);Other abnormalities of gait and mobility (R26.89);Muscle weakness (generalized) (M62.81);Difficulty in walking, not elsewhere classified (R26.2)    Time: 9528-4132 PT Time Calculation (min) (ACUTE ONLY): 33 min   Charges:   PT Evaluation $PT Eval Low Complexity: 1 Low PT Treatments $Therapeutic Activity: 8-22 mins PT General Charges $$ ACUTE PT VISIT: 1 Visit         Michael Church, PT Acute Rehab   Michael Church 09/04/2023, 7:18 PM

## 2023-09-05 ENCOUNTER — Encounter (HOSPITAL_COMMUNITY): Payer: Self-pay | Admitting: Specialist

## 2023-09-05 DIAGNOSIS — M1712 Unilateral primary osteoarthritis, left knee: Secondary | ICD-10-CM | POA: Diagnosis not present

## 2023-09-05 LAB — CBC
HCT: 41.2 % (ref 39.0–52.0)
Hemoglobin: 13.8 g/dL (ref 13.0–17.0)
MCH: 29.2 pg (ref 26.0–34.0)
MCHC: 33.5 g/dL (ref 30.0–36.0)
MCV: 87.1 fL (ref 80.0–100.0)
Platelets: 231 10*3/uL (ref 150–400)
RBC: 4.73 MIL/uL (ref 4.22–5.81)
RDW: 12.3 % (ref 11.5–15.5)
WBC: 8.8 10*3/uL (ref 4.0–10.5)
nRBC: 0 % (ref 0.0–0.2)

## 2023-09-05 LAB — BASIC METABOLIC PANEL
Anion gap: 9 (ref 5–15)
BUN: 14 mg/dL (ref 8–23)
CO2: 25 mmol/L (ref 22–32)
Calcium: 8.9 mg/dL (ref 8.9–10.3)
Chloride: 98 mmol/L (ref 98–111)
Creatinine, Ser: 1.07 mg/dL (ref 0.61–1.24)
GFR, Estimated: >60 mL/min (ref >60)
Glucose, Bld: 157 mg/dL — ABNORMAL HIGH (ref 70–99)
Potassium: 4.2 mmol/L (ref 3.5–5.1)
Sodium: 132 mmol/L — ABNORMAL LOW (ref 135–145)

## 2023-09-05 LAB — GLUCOSE, CAPILLARY
Glucose-Capillary: 150 mg/dL — ABNORMAL HIGH (ref 70–99)
Glucose-Capillary: 202 mg/dL — ABNORMAL HIGH (ref 70–99)
Glucose-Capillary: 170 mg/dL — ABNORMAL HIGH (ref 70–99)

## 2023-09-05 MED ORDER — TAMSULOSIN HCL 0.4 MG PO CAPS: 0.4000 mg | ORAL_CAPSULE | Freq: Every day | ORAL | Status: DC

## 2023-09-05 NOTE — Progress Notes (Signed)
Michael Church to be D/C'd home per MD order. Discussed with the patient and all questions fully answered.  Skin clean and dry without evidence of skin break down, no evidence of skin tears noted. Dressing to left leg clean, dry, and intact. IV catheter discontinued intact. Site without signs and symptoms of complications. Dressing and pressure applied.  An After Visit Summary was printed and given to the patient.  Patient escorted via WC, and D/C home via private auto.  Jon Gills  09/05/2023

## 2023-09-05 NOTE — Progress Notes (Signed)
Subjective: 1 Day Post-Op Procedure(s) (LRB): TOTAL KNEE ARTHROPLASTY (Left) Patient reports pain as moderate.    Objective: Vital signs in last 24 hours: Temp:  [97.3 F (36.3 C)-97.8 F (36.6 C)] 97.4 F (36.3 C) (12/19 0937) Pulse Rate:  [57-93] 79 (12/19 0937) Resp:  [10-22] 18 (12/19 0937) BP: (112-149)/(59-96) 141/79 (12/19 0937) SpO2:  [96 %-100 %] 98 % (12/19 0937)  Intake/Output from previous day: 12/18 0701 - 12/19 0700 In: 3501.1 [P.O.:1000; I.V.:2001.1; IV Piggyback:500] Out: 4130 [Urine:4080; Blood:50] Intake/Output this shift: Total I/O In: 240 [P.O.:240] Out: -   Recent Labs    09/05/23 0529  HGB 13.8   Recent Labs    09/05/23 0529  WBC 8.8  RBC 4.73  HCT 41.2  PLT 231   Recent Labs    09/05/23 0529  NA 132*  K 4.2  CL 98  CO2 25  BUN 14  CREATININE 1.07  GLUCOSE 157*  CALCIUM 8.9   No results for input(s): "LABPT", "INR" in the last 72 hours.  Neurologically intact ABD soft Neurovascular intact Sensation intact distally Intact pulses distally Dorsiflexion/Plantar flexion intact Incision: dressing C/D/I and no drainage No cellulitis present Compartment soft No sign of DVT   Assessment/Plan: 1 Day Post-Op Procedure(s) (LRB): TOTAL KNEE ARTHROPLASTY (Left) Advance diet Up with therapy D/C IV fluids  Anticipate D/C to home later today after passes PT HHPT arranged w [facility]  Patient's anticipated LOS is less than 2 midnights, meeting these requirements: - Younger than 68 - Lives within 1 hour of care - Has a competent adult at home to recover with post-op recover - NO history of  - Chronic pain requiring opiods  - Diabetes  - Coronary Artery Disease  - Heart failure  - Heart attack  - Stroke  - DVT/VTE  - Cardiac arrhythmia  - Respiratory Failure/COPD  - Renal failure  - Anemia  - Advanced Liver disease     Dorothy Spark 09/05/2023, 10:47 AM

## 2023-09-05 NOTE — TOC Transition Note (Signed)
Transition of Care [facility]) - Discharge Note  Patient Details  Name: Michael Church MRN: [id number] Date of Birth: 04-25-1956  Transition of Care [facility]) CM/SW Contact:  Ewing Schlein, LCSW Phone Number: 09/05/2023, 12:58 PM  Clinical Narrative: Patient is expected to discharge home after working with PT. CSW spoke with patient's spouse to confirm discharge plan. Patient was prearranged with [facility] for HHPT. Patient will need a rolling walker and spouse is agreeable to [employer] delivering the walker to the patient's room. CSW made DME referral to Zack with [employer]. [employer] to deliver rolling walker to patient's room. TOC signing off.  Final next level of care: Home w Home Health Services Barriers to Discharge: No Barriers Identified  Patient Goals and CMS Choice Patient states their goals for this hospitalization and ongoing recovery are:: Discharge home with HHPT CMS Medicare.gov Compare Post Acute Care list provided to:: Patient Choice offered to / list presented to : Patient, Spouse  Discharge Plan and Services Additional resources added to the After Visit Summary for           DME Arranged: Walker rolling DME Agency: [employer] Date DME Agency Contacted: 09/05/23 Time DME Agency Contacted: 1057 Representative spoke with at DME Agency: Zack HH Arranged: PT HH Agency: Other - See comment Producer, television/film/video) Representative spoke with at [facility] Agency: Prearranged in orthopedist's office  Social Drivers of Health (SDOH) Interventions SDOH Screenings   Food Insecurity: No Food Insecurity (09/04/2023)  Housing: Unknown (09/04/2023)  Transportation Needs: No Transportation Needs (09/04/2023)  Utilities: Not At Risk (09/04/2023)  Alcohol Screen: Low Risk  (03/26/2022)  Depression (PHQ2-9): Low Risk  (08/02/2023)  Financial Resource Strain: Low Risk  (03/26/2022)  Physical Activity: Insufficiently Active (03/26/2022)  Social Connections: Unknown (03/26/2022)  Stress: Patient Declined (03/26/2022)   Tobacco Use: High Risk (09/04/2023)   Readmission Risk Interventions     No data to display

## 2023-09-05 NOTE — Progress Notes (Signed)
Physical Therapy Treatment Patient Details Name: Michael Church MRN: [id number] DOB: September 02, 1956 Today's Date: 09/05/2023   History of Present Illness 67 yo male presents to therapy s/p L TKA on 09/04/2023 due to failure of conservative measures. Pt PMH includes but is not limited to: GERD, lumbar herniated disc, renal cell carcinoma s/p partial R nephrectomy, HTN, OA, and DM II.    PT Comments  Pt ambulated in hallway and performed LE exercises.  Pt's wife to visit later, so plan is for pt to practice safe stair technique this afternoon with spouse present.     If plan is discharge home, recommend the following: A little help with walking and/or transfers;Help with stairs or ramp for entrance;Assist for transportation   Can travel by private vehicle        Equipment Recommendations  Rolling walker (2 wheels)    Recommendations for Other Services       Precautions / Restrictions Precautions Precautions: Knee;Fall Restrictions Weight Bearing Restrictions Per Provider Order: No     Mobility  Bed Mobility Overal bed mobility: Needs Assistance Bed Mobility: Supine to Sit     Supine to sit: Contact guard     General bed mobility comments: verbal cues for self assist Lt LE    Transfers Overall transfer level: Needs assistance Equipment used: Rolling walker (2 wheels) Transfers: Sit to/from Stand Sit to Stand: Contact guard assist, From elevated surface           General transfer comment: verbal cues for UE and LE positioning for pain control    Ambulation/Gait Ambulation/Gait assistance: Contact guard assist Gait Distance (Feet): 120 Feet Assistive device: Rolling walker (2 wheels) Gait Pattern/deviations: Step-to pattern, Decreased stance time - left, Antalgic Gait velocity: decr     General Gait Details: verbal cues for sequence, RW positioning, step length   Stairs             Wheelchair Mobility     Tilt Bed    Modified Rankin (Stroke  Patients Only)       Balance                                            Cognition Arousal: Alert Behavior During Therapy: WFL for tasks assessed/performed Overall Cognitive Status: Within Functional Limits for tasks assessed                                          Exercises Total Joint Exercises Ankle Circles/Pumps: AROM, Both, 10 reps Quad Sets: AROM, Both, 10 reps Heel Slides: 10 reps, Left, AAROM Hip ABduction/ADduction: AAROM, Left, 10 reps Straight Leg Raises: AAROM, Left, 10 reps Knee Flexion: 10 reps, AROM, Seated, Left    General Comments        Pertinent Vitals/Pain Pain Assessment Pain Assessment: 0-10 Pain Score: 7  Pain Location: left knee Pain Descriptors / Indicators: Sore, Aching Pain Intervention(s): Monitored during session, Repositioned, Premedicated before session    Home Living                          Prior Function            PT Goals (current goals can now be found in the care plan section) Progress towards PT goals:  Progressing toward goals    Frequency    7X/week      PT Plan      Co-evaluation              AM-PAC PT "6 Clicks" Mobility   Outcome Measure  Help needed turning from your back to your side while in a flat bed without using bedrails?: A Little Help needed moving from lying on your back to sitting on the side of a flat bed without using bedrails?: A Little Help needed moving to and from a bed to a chair (including a wheelchair)?: A Little Help needed standing up from a chair using your arms (e.g., wheelchair or bedside chair)?: A Little Help needed to walk in hospital room?: A Little Help needed climbing 3-5 steps with a railing? : A Lot 6 Click Score: 17    End of Session Equipment Utilized During Treatment: Gait belt Activity Tolerance: Patient tolerated treatment well Patient left: with call bell/phone within reach;in chair Nurse Communication: Mobility  status PT Visit Diagnosis: Difficulty in walking, not elsewhere classified (R26.2);Pain Pain - Right/Left: Left Pain - part of body: Knee     Time: 1029-1109 PT Time Calculation (min) (ACUTE ONLY): 40 min  Charges:    $Gait Training: 23-37 mins $Therapeutic Exercise: 8-22 mins PT General Charges $$ ACUTE PT VISIT: 1 Visit                    Michael Church, DPT Physical Therapist Acute Rehabilitation Services Office: [phone]    Michael Church 09/05/2023, 4:14 PM

## 2023-09-05 NOTE — Progress Notes (Signed)
Physical Therapy Treatment Patient Details Name: Michael Church MRN: [id number] DOB: 13-Jun-1956 Today's Date: 09/05/2023   History of Present Illness 67 yo male presents to therapy s/p L TKA on 09/04/2023 due to failure of conservative measures. Pt PMH includes but is not limited to: GERD, lumbar herniated disc, renal cell carcinoma s/p partial R nephrectomy, HTN, OA, and DM II.    PT Comments  Pt ambulated in hallway and practiced safe stair technique with spouse holding RW.  Pt and spouse both report understanding.  Pt also performed exercises again with HEP.  Pt feels ready for d/c home today.  Pt and spouse had no further questions.     If plan is discharge home, recommend the following: A little help with walking and/or transfers;Help with stairs or ramp for entrance;Assist for transportation   Can travel by private vehicle        Equipment Recommendations  Rolling walker (2 wheels)    Recommendations for Other Services       Precautions / Restrictions Precautions Precautions: Knee;Fall Restrictions Weight Bearing Restrictions Per Provider Order: No     Mobility  Bed Mobility Overal bed mobility: Needs Assistance Bed Mobility: Supine to Sit     Supine to sit: Contact guard, Supervision     General bed mobility comments: verbal cues for self assist Lt LE    Transfers Overall transfer level: Needs assistance Equipment used: Rolling walker (2 wheels) Transfers: Sit to/from Stand Sit to Stand: Contact guard assist           General transfer comment: verbal cues for UE and LE positioning for pain control    Ambulation/Gait Ambulation/Gait assistance: Contact guard assist Gait Distance (Feet): 150 Feet Assistive device: Rolling walker (2 wheels) Gait Pattern/deviations: Step-to pattern, Decreased stance time - left, Antalgic Gait velocity: decr     General Gait Details: verbal cues for sequence, RW positioning, step length   Stairs Stairs:  Yes Stairs assistance: Contact guard assist Stair Management: Backwards, With walker, Step to pattern Number of Stairs: 5 General stair comments: verbal cues for sequence and safety, RW positioning; spouse present and assisted with holding RW; pt and spouse report understanding   Wheelchair Mobility     Tilt Bed    Modified Rankin (Stroke Patients Only)       Balance                                            Cognition Arousal: Alert Behavior During Therapy: WFL for tasks assessed/performed Overall Cognitive Status: Within Functional Limits for tasks assessed                                          Exercises Total Joint Exercises Ankle Circles/Pumps: AROM, Both, 10 reps Quad Sets: AROM, Both, 10 reps Heel Slides: 10 reps, Left, AAROM Hip ABduction/ADduction: AAROM, Left, 10 reps Straight Leg Raises: AAROM, Left, 10 reps Knee Flexion: 10 reps, AROM, Seated, Left    General Comments        Pertinent Vitals/Pain Pain Assessment Pain Assessment: 0-10 Pain Score: 8  Pain Location: left knee Pain Descriptors / Indicators: Sore, Aching Pain Intervention(s): Premedicated before session, Repositioned, Monitored during session, Patient requesting pain meds-RN notified    Home Living  Prior Function            PT Goals (current goals can now be found in the care plan section) Progress towards PT goals: Progressing toward goals    Frequency    7X/week      PT Plan      Co-evaluation              AM-PAC PT "6 Clicks" Mobility   Outcome Measure  Help needed turning from your back to your side while in a flat bed without using bedrails?: A Little Help needed moving from lying on your back to sitting on the side of a flat bed without using bedrails?: A Little Help needed moving to and from a bed to a chair (including a wheelchair)?: A Little Help needed standing up from a chair  using your arms (e.g., wheelchair or bedside chair)?: A Little Help needed to walk in hospital room?: A Little Help needed climbing 3-5 steps with a railing? : A Little 6 Click Score: 18    End of Session Equipment Utilized During Treatment: Gait belt Activity Tolerance: Patient tolerated treatment well Patient left: with call bell/phone within reach;in bed;with family/visitor present Nurse Communication: Mobility status PT Visit Diagnosis: Difficulty in walking, not elsewhere classified (R26.2);Pain Pain - Right/Left: Left Pain - part of body: Knee     Time: 4034-7425 PT Time Calculation (min) (ACUTE ONLY): 36 min  Charges:    $Gait Training: 8-22 mins $Therapeutic Exercise: 8-22 mins PT General Charges $$ ACUTE PT VISIT: 1 Visit                     Paulino Door, DPT Physical Therapist Acute Rehabilitation Services Office: [phone]    [name] 09/05/2023, 4:40 PM

## 2023-09-05 NOTE — Progress Notes (Signed)
Subjective: 1 Day Post-Op Procedure(s) (LRB): TOTAL KNEE ARTHROPLASTY (Left) Patient reports pain as 4 on 0-10 scale.   Denies CP or SOB.  Voiding without difficulty. Positive flatus. Objective: Vital signs in last 24 hours: Temp:  [97.3 F (36.3 C)-98.7 F (37.1 C)] 97.7 F (36.5 C) (12/19 0553) Pulse Rate:  [57-93] 64 (12/19 0553) Resp:  [10-22] 14 (12/19 0553) BP: (112-172)/(59-101) 119/75 (12/19 0553) SpO2:  [96 %-100 %] 96 % (12/19 0553) Weight:  [113.9 kg] 113.9 kg (12/18 0941)  Intake/Output from previous day: 12/18 0701 - 12/19 0700 In: 3501.1 [P.O.:1000; I.V.:2001.1; IV Piggyback:500] Out: 4130 [Urine:4080; Blood:50] Intake/Output this shift: No intake/output data recorded.  Recent Labs    09/05/23 0529  HGB 13.8   Recent Labs    09/05/23 0529  WBC 8.8  RBC 4.73  HCT 41.2  PLT 231   Recent Labs    09/05/23 0529  NA 132*  K 4.2  CL 98  CO2 25  BUN 14  CREATININE 1.07  GLUCOSE 157*  CALCIUM 8.9   No results for input(s): "LABPT", "INR" in the last 72 hours.  Neurologically intact Neurovascular intact Sensation intact distally Intact pulses distally Dorsiflexion/Plantar flexion intact Incision: dressing C/D/I Compartment soft No DVT  Assessment/Plan:  1 Day Post-Op Procedure(s) (LRB): TOTAL KNEE ARTHROPLASTY (Left) Advance diet Up with therapy Discharge home with home health   Principal Problem:   Right knee DJD      Javier Docker 09/05/2023, @[facility] 

## 2023-09-26 ENCOUNTER — Other Ambulatory Visit: Payer: Self-pay | Admitting: Adult Health

## 2023-09-26 DIAGNOSIS — I10 Essential (primary) hypertension: Secondary | ICD-10-CM

## 2023-09-26 NOTE — Telephone Encounter (Signed)
 Copied from CRM [id number]. Topic: Clinical - Medication Refill >> Sep 26, 2023  2:26 PM Eleanor C wrote: Most Recent Primary Care Visit:  Provider: MERNA HUXLEY  Department: [facility]  Visit Type: OFFICE VISIT  Date: 08/02/2023  Medication: hydrochlorothiazide  (MICROZIDE ) 12.5 MG capsule  Has the patient contacted their pharmacy? Yes (Agent: If no, request that the patient contact the pharmacy for the refill. If patient does not wish to contact the pharmacy document the reason why and proceed with request.) (Agent: If yes, when and what did the pharmacy advise?)  Is this the correct pharmacy for this prescription? Yes If no, delete pharmacy and type the correct one.  This is the patient's preferred pharmacy:  [facility] [address], [place] - [address] [place] [address] Phone: [phone] Fax: [phone]   Has the prescription been filled recently? No  Is the patient out of the medication? No  Has the patient been seen for an appointment in the last year OR does the patient have an upcoming appointment? Yes  Can we respond through MyChart? Yes  Agent: Please be advised that Rx refills may take up to 3 business days. We ask that you follow-up with your pharmacy.

## 2023-09-26 NOTE — Telephone Encounter (Signed)
 Last OV 08/02/23 for pre-op, 04/11/23 routine Next appt 10/11/2023 for 86m f/u

## 2023-09-27 MED ORDER — HYDROCHLOROTHIAZIDE 12.5 MG PO CAPS: ORAL_CAPSULE | ORAL | 3 refills | Status: DC

## 2023-10-11 ENCOUNTER — Ambulatory Visit: Payer: No Typology Code available for payment source | Admitting: Adult Health

## 2023-10-11 ENCOUNTER — Ambulatory Visit (INDEPENDENT_AMBULATORY_CARE_PROVIDER_SITE_OTHER): Payer: No Typology Code available for payment source | Admitting: Adult Health

## 2023-10-11 ENCOUNTER — Encounter: Payer: Self-pay | Admitting: Adult Health

## 2023-10-11 VITALS — BP 123/78 | HR 83 | Temp 98.0°F | Ht 71.0 in | Wt 232.0 lb

## 2023-10-11 DIAGNOSIS — E119 Type 2 diabetes mellitus without complications: Secondary | ICD-10-CM | POA: Diagnosis not present

## 2023-10-11 DIAGNOSIS — Z6832 Body mass index (BMI) 32.0-32.9, adult: Secondary | ICD-10-CM

## 2023-10-11 DIAGNOSIS — I10 Essential (primary) hypertension: Secondary | ICD-10-CM | POA: Diagnosis not present

## 2023-10-11 DIAGNOSIS — E66811 Obesity, class 1: Secondary | ICD-10-CM

## 2023-10-11 DIAGNOSIS — Z7984 Long term (current) use of oral hypoglycemic drugs: Secondary | ICD-10-CM

## 2023-10-11 LAB — POCT GLYCOSYLATED HEMOGLOBIN (HGB A1C): Hemoglobin A1C: 5.9 % — AB (ref 4.0–5.6)

## 2023-10-11 MED ORDER — METFORMIN HCL ER 500 MG PO TB24: 1000.0000 mg | ORAL_TABLET | Freq: Two times a day (BID) | ORAL | 1 refills | Status: DC

## 2023-10-11 NOTE — Progress Notes (Signed)
Subjective:    Patient ID: Michael Church, male    DOB: May 12, 1956, 68 y.o.   MRN: [id number]  HPI  68 year old male who  has a past medical history of Cancer (HCC), Chronic rhinitis, GERD (gastroesophageal reflux disease), Herniated lumbar intervertebral disc, History of epistaxis, History of hiatal hernia, History of kidney stones, History of renal cell carcinoma (08/2016), Hypertension, Incisional hernia, Nocturia, OA (osteoarthritis), Seasonal allergies, Type 2 diabetes mellitus (HCC), and Varicose vein of leg.  He presents to the office today for six month follow up regarding HTN and DM   HTN -managed with HCTZ 12.5 mg daily.  He denies dizziness, lightheadedness, chest pain, shortness of breath, or syncopal episodes. He does monitor his BP at home with readings in the 120/80's ,His BP elevated in the office BP Readings from Last 3 Encounters:  10/11/23 123/78  09/05/23 (!) 164/71  08/30/23 (!) 149/95   Diabetes mellitus type 2-managed with metformin 1000 mg twice daily.  He had been using the [employer] 3 sensor to monitor his blood sugars. He has cut out sweets and is on a carb restricted diet. He has lost 24 pounds since he was last seen in the office 3 months ago.  Lab Results  Component Value Date   HGBA1C 7.2 (H) 08/02/2023   HGBA1C 6.7 (A) 02/19/2023   HGBA1C 6.7 (H) 03/27/2022    CONTINUOUS GLUCOSE MONITORING RECORD INTERPRETATION                 Dates of Recording: Dec 26th - January 24th.    Sensor description: freestyle libre    Results statistics:   CGM use % of time 91  Average  112  Time in range 96  % Time Above 180 4  % Time above 250 0  % Time Below target 0    Wt Readings from Last 5 Encounters:  10/11/23 232 lb (105.2 kg)  09/04/23 251 lb (113.9 kg)  08/30/23 251 lb (113.9 kg)  08/02/23 256 lb 8 oz (116.3 kg)  06/18/23 258 lb (117 kg)  ] Review of Systems See HPI   Past Medical History:  Diagnosis Date   Cancer (HCC)    Chronic rhinitis     followed by dr Irena Cords   GERD (gastroesophageal reflux disease)    Herniated lumbar intervertebral disc    not a problem presently   History of epistaxis    History of hiatal hernia    History of kidney stones    History of renal cell carcinoma 08/2016   urologist--- dr Marlou Porch;   s/p right partial nephrecotmy for papillary RCC, no chemo / radiation  (previously seen by oncology--- dr Bertis Ruddy, lov note in epic 04-04-2017)   Hypertension    Incisional hernia    Nocturia    OA (osteoarthritis)    knees   Seasonal allergies    Type 2 diabetes mellitus (HCC)    followed by pcp---  (11-27-2021  per pt check blood sugar twice daily, fasting sugar---112--140)   Varicose vein of leg     Social History   Socioeconomic History   Marital status: Married    Spouse name: Sue Lush   Number of children: 2   Years of education: Not on file   Highest education level: Bachelor's degree (e.g., BA, AB, BS)  Occupational History   Occupation: Code and compliance    Employer: [employer]  Tobacco Use   Smoking status: Some Days    Types: Cigars  Smokeless tobacco: Never   Tobacco comments:    11-27-2021  Per pt average one cigar every 2-3 months  Vaping Use   Vaping status: Never Used  Substance and Sexual Activity   Alcohol use: Yes    Comment: occasional   Drug use: No   Sexual activity: Yes  Other Topics Concern   Not on file  Social History Narrative   Retired from post office    Works part time for the [employer]    Social Drivers of [employer]: Low Risk  (03/26/2022)   Overall Financial Resource Strain (CARDIA)    Difficulty of Paying Living Expenses: Not hard at all  Food Insecurity: No Food Insecurity (09/04/2023)   Hunger Vital Sign    Worried About Running Out of Food in the Last Year: Never true    Ran Out of Food in the Last Year: Never true  Transportation Needs: No Transportation Needs (09/04/2023)   PRAPARE -  Administrator, Civil Service (Medical): No    Lack of Transportation (Non-Medical): No  Physical Activity: Insufficiently Active (03/26/2022)   Exercise Vital Sign    Days of Exercise per Week: 4 days    Minutes of Exercise per Session: 20 min  Stress: Patient Declined (03/26/2022)   [employer] of Occupational Health - Occupational Stress Questionnaire    Feeling of Stress : Patient declined  Social Connections: Unknown (03/26/2022)   Social Connection and Isolation Panel [NHANES]    Frequency of Communication with Friends and Family: Once a week    Frequency of Social Gatherings with Friends and Family: Patient declined    Attends Religious Services: Patient declined    Active Member of Clubs or Organizations: Yes    Attends Banker Meetings: Patient declined    Marital Status: Married  Catering manager Violence: Not At Risk (09/04/2023)   Humiliation, Afraid, Rape, and Kick questionnaire    Fear of Current or Ex-Partner: No    Emotionally Abused: No    Physically Abused: No    Sexually Abused: No    Past Surgical History:  Procedure Laterality Date   CYSTECTOMY     per pt as child had a cyst removed that had was deep in chest wall   EXCISION MASS NECK  10/2011   posterior neck   INCISIONAL HERNIA REPAIR N/A 11/29/2021   Procedure: OPEN INCISIONAL HERNIA REPAIR WITH MESH;  Surgeon: Abigail Miyamoto, MD;  Location: [facility];  Service: General;  Laterality: N/A;   KNEE ARTHROSCOPY W/ MENISCAL REPAIR Bilateral 03/26/2011   @[facility]  by dr [name],  left knee//    right knee in 2017   MASS EXCISION N/A 09/11/2017   Procedure: MINOR EXCISION OF BACK MASS, EXCISION OF GLUTEAL MASS;  Surgeon: Axel Filler, MD;  Location: [facility];  Service: General;  Laterality: N/A;   ROBOTIC ASSITED PARTIAL NEPHRECTOMY Right 09/07/2016   Procedure: XI ROBOTIC ASSITED LAPAROSCOPIC RIGHT PARTIAL NEPHRECTOMY;  Surgeon: Crist Fat, MD;  Location: [facility];  Service: Urology;  Laterality: Right;   TONSILLECTOMY     child   TOTAL KNEE ARTHROPLASTY Left 09/04/2023   Procedure: TOTAL KNEE ARTHROPLASTY;  Surgeon: Jene Every, MD;  Location: [facility];  Service: Orthopedics;  Laterality: Left;   VARICOSE VEIN SURGERY Bilateral 01/2013   per pt stripping    Family History  Problem Relation Age of Onset   Stroke Father        age  51   Cancer Brother        Hodgkin   HIV/AIDS Brother        age 71    Colon cancer Neg Hx    Esophageal cancer Neg Hx    Rectal cancer Neg Hx    Stomach cancer Neg Hx     Allergies  Allergen Reactions   Mixed Ragweed Other (See Comments)    Aggravate allergies   Other Itching    Foods - cantaloupes, tomatoes, bananas, and  raw carrots.   Penicillins Itching    Has patient had a PCN reaction causing immediate rash, facial/tongue/throat swelling, SOB or lightheadedness with hypotension: No Has patient had a PCN reaction causing severe rash involving mucus membranes or skin necrosis: No Has patient had a PCN reaction that required hospitalization: No Has patient had a PCN reaction occurring within the last 10 years: No If all of the above answers are "NO", then may proceed with Cephalosporin use.     Current Outpatient Medications on File Prior to Visit  Medication Sig Dispense Refill   ARGININE PO Take 1,000-2,000 mg by mouth See admin instructions. Take 1000 mg in the morning and 2000 mg in the afternoon 1000 mg     aspirin EC 81 MG tablet Take 1 tablet (81 mg total) by mouth 2 (two) times daily after a meal. Day after surgery 60 tablet 1   blood glucose meter kit and supplies KIT Dispense based on patient and insurance preference. Use up to three times daily as directed. 1 each 0   cetirizine (ZYRTEC) 10 MG tablet Take 10 mg by mouth daily as needed for allergies.     Coenzyme Q10 (COQ10) 100 MG CAPS Take 100 mg by mouth daily.     Continuous Glucose Sensor (FREESTYLE LIBRE 3 SENSOR) MISC 1 Device  by Does not apply route every 14 (fourteen) days. Place 1 sensor on the skin every 14 days. Use to check glucose continuously 2 each 6   docusate sodium (COLACE) 100 MG capsule Take 1 capsule (100 mg total) by mouth 2 (two) times daily. 60 capsule 2   EPINEPHrine 0.3 mg/0.3 mL IJ SOAJ injection Inject 0.3 mg into the muscle as needed.     famotidine (PEPCID) 20 MG tablet Take 20 mg by mouth daily as needed.     hydrochlorothiazide (MICROZIDE) 12.5 MG capsule TAKE 1 CAPSULE(12.5 MG) BY MOUTH DAILY 90 capsule 3   metFORMIN (GLUCOPHAGE-XR) 500 MG 24 hr tablet Take 2 tablets (1,000 mg total) by mouth 2 (two) times daily with a meal. 360 tablet 0   methocarbamol (ROBAXIN) 500 MG tablet Take 1 tablet (500 mg total) by mouth every 8 (eight) hours as needed for muscle spasms. 30 tablet 1   Multiple Vitamin (MULTIVITAMIN WITH MINERALS) TABS Take 1 tablet by mouth daily. Centrum +     Multiple Vitamins-Minerals (PRESERVISION AREDS 2 PO) Take 1 tablet by mouth 2 (two) times daily.     OVER THE COUNTER MEDICATION Take 1 tablet by mouth daily. turkesterone  testosterone supplement 1300 mg     OVER THE COUNTER MEDICATION Take 1 capsule by mouth every morning. Ultra prostate formula     oxyCODONE (OXY IR/ROXICODONE) 5 MG immediate release tablet Take 1 tablet (5 mg total) by mouth every 4 (four) hours as needed for severe pain (pain score 7-10). 40 tablet 0   oxymetazoline (AFRIN) 0.05 % nasal spray Place 1 spray into both nostrils 2 (two) times daily as needed (nose  bleeds).     polyethylene glycol (MIRALAX / GLYCOLAX) 17 g packet Take 17 g by mouth daily. 14 each 0   PRESCRIPTION MEDICATION PER PT ALLERGY INJECTION  PER MONTH IN SUMMER     simvastatin (ZOCOR) 5 MG tablet Take 1 tablet (5 mg total) by mouth at bedtime. 90 tablet 3   sodium chloride (OCEAN) 0.65 % SOLN nasal spray Place 1 spray into both nostrils as needed for congestion.     tamsulosin (FLOMAX) 0.4 MG CAPS capsule Take 0.4 mg by mouth daily.      TURMERIC PO Take 750 mg by mouth 2 (two) times daily.     triamcinolone (NASACORT) 55 MCG/ACT AERO nasal inhaler Place 2 sprays into the nose daily. 2 sprays each nostril at night (Patient taking differently: Place 2 sprays into the nose daily as needed (Congestion and allergies).) 1 each 12   No current facility-administered medications on file prior to visit.    BP 123/78 Comment: Home  Pulse 83   Temp 98 F (36.7 C) (Oral)   Ht 5\' 11"  (1.803 m)   Wt 232 lb (105.2 kg)   SpO2 98%   BMI 32.36 kg/m       Objective:   Physical Exam Vitals and nursing note reviewed.  Constitutional:      Appearance: Normal appearance.  Cardiovascular:     Rate and Rhythm: Normal rate and regular rhythm.     Pulses: Normal pulses.     Heart sounds: Normal heart sounds.  Pulmonary:     Effort: Pulmonary effort is normal.     Breath sounds: Normal breath sounds.  Musculoskeletal:        General: Normal range of motion.  Skin:    General: Skin is warm and dry.  Neurological:     General: No focal deficit present.     Mental Status: He is alert and oriented to person, place, and time.  Psychiatric:        Mood and Affect: Mood normal.        Behavior: Behavior normal.        Thought Content: Thought content normal.        Judgment: Judgment normal.        Assessment & Plan:  1. Diabetes mellitus treated with oral medication (HCC) (Primary)  - POC HgB A1c- 5.9 - has improved. No change  - Follow up in 6 months for CPE  - metFORMIN (GLUCOPHAGE-XR) 500 MG 24 hr tablet; Take 2 tablets (1,000 mg total) by mouth 2 (two) times daily with a meal.  Dispense: 360 tablet; Refill: 1  2. Essential hypertension - Well controlled at home. No change in medication   3. Class 1 obesity with serious comorbidity and body mass index (BMI) of 32.0 to 32.9 in adult, unspecified obesity type - he has lost 24 pounds over the last 4 months with diet. Once he recovers from knee replacement surgery he should be  able to lose more with activity.   Shirline Frees, NP

## 2023-10-11 NOTE — Patient Instructions (Signed)
Health Maintenance Due  Topic Date Due   COVID-19 Vaccine (5 - 2024-25 season) 05/19/2023   Colonoscopy  11/28/2023       08/02/2023    2:14 PM 01/11/2023   11:27 AM 09/22/2021    9:15 AM  Depression screen PHQ 2/9  Decreased Interest 0 0 0  Down, Depressed, Hopeless 0 0 0  PHQ - 2 Score 0 0 0

## 2023-10-28 ENCOUNTER — Other Ambulatory Visit: Payer: Self-pay | Admitting: Adult Health

## 2023-10-29 ENCOUNTER — Other Ambulatory Visit: Payer: Self-pay | Admitting: Adult Health

## 2023-10-29 NOTE — Telephone Encounter (Signed)
Copied from CRM [id number]. Topic: Clinical - Medication Refill >> Oct 29, 2023  3:55 PM Almira Coaster wrote: Most Recent Primary Care Visit:  Provider: Shirline Frees  Department: [facility]  Visit Type: OFFICE VISIT  Date: 10/11/2023  Medication: Continuous Glucose Sensor (FREESTYLE LIBRE 3 SENSOR) MISC  Has the patient contacted their pharmacy? Yes, request sent to the office  (Agent: If no, request that the patient contact the pharmacy for the refill. If patient does not wish to contact the pharmacy document the reason why and proceed with request.) (Agent: If yes, when and what did the pharmacy advise?)  Is this the correct pharmacy for this prescription? Yes If no, delete pharmacy and type the correct one.  This is the patient's preferred pharmacy:  [facility] [address] [place] - [address] [place] [address] Phone: [phone] Fax: [phone]   Has the prescription been filled recently? No  Is the patient out of the medication? Yes  Has the patient been seen for an appointment in the last year OR does the patient have an upcoming appointment? Yes  Can we respond through MyChart? Yes  Agent: Please be advised that Rx refills may take up to 3 business days. We ask that you follow-up with your pharmacy.

## 2023-11-22 ENCOUNTER — Other Ambulatory Visit: Payer: Self-pay | Admitting: Adult Health

## 2023-11-26 ENCOUNTER — Telehealth: Payer: Self-pay

## 2023-11-26 DIAGNOSIS — Z1211 Encounter for screening for malignant neoplasm of colon: Secondary | ICD-10-CM

## 2023-11-26 NOTE — Addendum Note (Signed)
 Addended by: Kern Reap B on: 11/26/2023 03:13 PM   Modules accepted: Orders

## 2023-11-26 NOTE — Telephone Encounter (Signed)
 Copied from CRM [id number]. Topic: Clinical - Request for Lab/Test Order >> Nov 26, 2023  1:55 PM Isabell A wrote: Reason for CRM: Patient states he received a MyChart message to schedule his colonoscopy - he would like more information in regard to this.

## 2023-11-26 NOTE — Telephone Encounter (Signed)
Patient is aware and referral placed 

## 2023-11-28 ENCOUNTER — Encounter: Payer: Self-pay | Admitting: Adult Health

## 2023-11-28 ENCOUNTER — Encounter: Payer: Self-pay | Admitting: Gastroenterology

## 2023-11-28 NOTE — Telephone Encounter (Signed)
**Note De-identified  [place] Obfuscation** Please advise 

## 2023-11-29 ENCOUNTER — Telehealth: Payer: Self-pay | Admitting: Adult Health

## 2023-11-29 VITALS — Ht 71.0 in | Wt 232.0 lb

## 2023-11-29 DIAGNOSIS — U071 COVID-19: Secondary | ICD-10-CM

## 2023-11-29 MED ORDER — NIRMATRELVIR/RITONAVIR (PAXLOVID)TABLET: 3.0000 | ORAL_TABLET | Freq: Two times a day (BID) | ORAL | 0 refills | Status: AC

## 2023-11-29 NOTE — Progress Notes (Signed)
 Virtual Visit via Video Note  I connected with Michael Church on 11/29/23 at 11:00 AM EDT by a video enabled telemedicine application and verified that I am speaking with the correct person using two identifiers.  Location patient: home Location provider:work or home office Persons participating in the virtual visit: patient, provider  I discussed the limitations of evaluation and management by telemedicine and the availability of in person appointments. The patient expressed understanding and agreed to proceed.   HPI: 68 year old male who is being evaluated today for an acute visit.  He tested positive at home for COVID-19 2 days ago.  His wife tested positive for COVID-19 a few days prior.  Symptoms include constant headache, scratchy throat, body aches, nasal drainage, and dry mouth.  He has not had any fevers or chills, nausea vomiting, or diarrhea.  He is interested in having antiviral therapy   ROS: See pertinent positives and negatives per HPI.  Past Medical History:  Diagnosis Date   Cancer (HCC)    Chronic rhinitis    followed by dr Irena Cords   GERD (gastroesophageal reflux disease)    Herniated lumbar intervertebral disc    not a problem presently   History of epistaxis    History of hiatal hernia    History of kidney stones    History of renal cell carcinoma 08/2016   urologist--- dr Marlou Porch;   s/p right partial nephrecotmy for papillary RCC, no chemo / radiation  (previously seen by oncology--- dr Bertis Ruddy, lov note in epic 04-04-2017)   Hypertension    Incisional hernia    Nocturia    OA (osteoarthritis)    knees   Seasonal allergies    Type 2 diabetes mellitus (HCC)    followed by pcp---  (11-27-2021  per pt check blood sugar twice daily, fasting sugar---112--140)   Varicose vein of leg     Past Surgical History:  Procedure Laterality Date   CYSTECTOMY     per pt as child had a cyst removed that had was deep in chest wall   EXCISION MASS NECK  10/2011    posterior neck   INCISIONAL HERNIA REPAIR N/A 11/29/2021   Procedure: OPEN INCISIONAL HERNIA REPAIR WITH MESH;  Surgeon: Abigail Miyamoto, MD;  Location: [facility];  Service: General;  Laterality: N/A;   KNEE ARTHROSCOPY W/ MENISCAL REPAIR Bilateral 03/26/2011   @[facility]  by dr [name],  left knee//    right knee in 2017   MASS EXCISION N/A 09/11/2017   Procedure: MINOR EXCISION OF BACK MASS, EXCISION OF GLUTEAL MASS;  Surgeon: Axel Filler, MD;  Location: [facility];  Service: General;  Laterality: N/A;   ROBOTIC ASSITED PARTIAL NEPHRECTOMY Right 09/07/2016   Procedure: XI ROBOTIC ASSITED LAPAROSCOPIC RIGHT PARTIAL NEPHRECTOMY;  Surgeon: Crist Fat, MD;  Location: [facility];  Service: Urology;  Laterality: Right;   TONSILLECTOMY     child   TOTAL KNEE ARTHROPLASTY Left 09/04/2023   Procedure: TOTAL KNEE ARTHROPLASTY;  Surgeon: Jene Every, MD;  Location: [facility];  Service: Orthopedics;  Laterality: Left;   VARICOSE VEIN SURGERY Bilateral 01/2013   per pt stripping    Family History  Problem Relation Age of Onset   Stroke Father        age 60   Cancer Brother        Hodgkin   HIV/AIDS Brother        age 19    Colon cancer Neg Hx    Esophageal cancer Neg Hx  Rectal cancer Neg Hx    Stomach cancer Neg Hx        Current Outpatient Medications:    ARGININE PO, Take 1,000-2,000 mg by mouth See admin instructions. Take 1000 mg in the morning and 2000 mg in the afternoon 1000 mg, Disp: , Rfl:    aspirin EC 81 MG tablet, Take 1 tablet (81 mg total) by mouth 2 (two) times daily after a meal. Day after surgery, Disp: 60 tablet, Rfl: 1   blood glucose meter kit and supplies KIT, Dispense based on patient and insurance preference. Use up to three times daily as directed., Disp: 1 each, Rfl: 0   cetirizine (ZYRTEC) 10 MG tablet, Take 10 mg by mouth daily as needed for allergies., Disp: , Rfl:    Coenzyme Q10 (COQ10) 100 MG CAPS, Take 100 mg by mouth daily., Disp: , Rfl:     Continuous Glucose Sensor (FREESTYLE LIBRE 3 SENSOR) MISC, APPLY 1 SENSOR TOPICALLY TO SKIN AND CHANGE EVERY 14 DAYS (USE TO CHECK GLUCOSE CONTINUOUSLY), Disp: 2 each, Rfl: 0   docusate sodium (COLACE) 100 MG capsule, Take 1 capsule (100 mg total) by mouth 2 (two) times daily., Disp: 60 capsule, Rfl: 2   EPINEPHrine 0.3 mg/0.3 mL IJ SOAJ injection, Inject 0.3 mg into the muscle as needed., Disp: , Rfl:    famotidine (PEPCID) 20 MG tablet, Take 20 mg by mouth daily as needed., Disp: , Rfl:    hydrochlorothiazide (MICROZIDE) 12.5 MG capsule, TAKE 1 CAPSULE(12.5 MG) BY MOUTH DAILY, Disp: 90 capsule, Rfl: 3   metFORMIN (GLUCOPHAGE-XR) 500 MG 24 hr tablet, Take 2 tablets (1,000 mg total) by mouth 2 (two) times daily with a meal., Disp: 360 tablet, Rfl: 1   methocarbamol (ROBAXIN) 500 MG tablet, Take 1 tablet (500 mg total) by mouth every 8 (eight) hours as needed for muscle spasms., Disp: 30 tablet, Rfl: 1   Multiple Vitamin (MULTIVITAMIN WITH MINERALS) TABS, Take 1 tablet by mouth daily. Centrum +, Disp: , Rfl:    Multiple Vitamins-Minerals (PRESERVISION AREDS 2 PO), Take 1 tablet by mouth 2 (two) times daily., Disp: , Rfl:    nirmatrelvir/ritonavir (PAXLOVID) 20 x 150 MG & 10 x 100MG  TABS, Take 3 tablets by mouth 2 (two) times daily for 5 days. (Take nirmatrelvir 150 mg two tablets twice daily for 5 days and ritonavir 100 mg one tablet twice daily for 5 days) Patient GFR is >60, Disp: 30 tablet, Rfl: 0   OVER THE COUNTER MEDICATION, Take 1 tablet by mouth daily. turkesterone  testosterone supplement 1300 mg, Disp: , Rfl:    OVER THE COUNTER MEDICATION, Take 1 capsule by mouth every morning. Ultra prostate formula, Disp: , Rfl:    oxyCODONE (OXY IR/ROXICODONE) 5 MG immediate release tablet, Take 1 tablet (5 mg total) by mouth every 4 (four) hours as needed for severe pain (pain score 7-10)., Disp: 40 tablet, Rfl: 0   oxymetazoline (AFRIN) 0.05 % nasal spray, Place 1 spray into both nostrils 2 (two)  times daily as needed (nose bleeds)., Disp: , Rfl:    polyethylene glycol (MIRALAX / GLYCOLAX) 17 g packet, Take 17 g by mouth daily., Disp: 14 each, Rfl: 0   PRESCRIPTION MEDICATION, PER PT ALLERGY INJECTION  PER MONTH IN SUMMER, Disp: , Rfl:    simvastatin (ZOCOR) 5 MG tablet, Take 1 tablet (5 mg total) by mouth at bedtime., Disp: 90 tablet, Rfl: 3   sodium chloride (OCEAN) 0.65 % SOLN nasal spray, Place 1 spray into both  nostrils as needed for congestion., Disp: , Rfl:    tamsulosin (FLOMAX) 0.4 MG CAPS capsule, Take 0.4 mg by mouth daily., Disp: , Rfl:    TURMERIC PO, Take 750 mg by mouth 2 (two) times daily., Disp: , Rfl:    triamcinolone (NASACORT) 55 MCG/ACT AERO nasal inhaler, Place 2 sprays into the nose daily. 2 sprays each nostril at night (Patient taking differently: Place 2 sprays into the nose daily as needed (Congestion and allergies).), Disp: 1 each, Rfl: 12  EXAM:  VITALS per patient if applicable:  GENERAL: alert, oriented, appears well and in no acute distress  HEENT: atraumatic, conjunttiva clear, no obvious abnormalities on inspection of external nose and ears  NECK: normal movements of the head and neck  LUNGS: on inspection no signs of respiratory distress, breathing rate appears normal, no obvious gross SOB, gasping or wheezing  CV: no obvious cyanosis  MS: moves all visible extremities without noticeable abnormality  PSYCH/NEURO: pleasant and cooperative, no obvious depression or anxiety, speech and thought processing grossly intact  ASSESSMENT AND PLAN:  Discussed the following assessment and plan:  1. COVID-19 (Primary) -Due to history of diabetes mellitus and hypertension will place on Paxlovid.Marland Kitchen  He was advised to stop his simvastatin while taking this and do not restart for an additional 5 days after finishing Paxlovid.  If symptoms worsen advised to follow-up. - nirmatrelvir/ritonavir (PAXLOVID) 20 x 150 MG & 10 x 100MG  TABS; Take 3 tablets by mouth 2  (two) times daily for 5 days. (Take nirmatrelvir 150 mg two tablets twice daily for 5 days and ritonavir 100 mg one tablet twice daily for 5 days) Patient GFR is >60  Dispense: 30 tablet; Refill: 0      I discussed the assessment and treatment plan with the patient. The patient was provided an opportunity to ask questions and all were answered. The patient agreed with the plan and demonstrated an understanding of the instructions.   The patient was advised to call back or seek an in-person evaluation if the symptoms worsen or if the condition fails to improve as anticipated.   Shirline Frees, NP

## 2023-12-27 ENCOUNTER — Other Ambulatory Visit: Payer: Self-pay | Admitting: Adult Health

## 2024-01-10 ENCOUNTER — Ambulatory Visit (AMBULATORY_SURGERY_CENTER): Payer: Self-pay

## 2024-01-10 VITALS — Ht 71.0 in | Wt 240.0 lb

## 2024-01-10 DIAGNOSIS — Z860100 Personal history of colon polyps, unspecified: Secondary | ICD-10-CM

## 2024-01-10 MED ORDER — NA SULFATE-K SULFATE-MG SULF 17.5-3.13-1.6 GM/177ML PO SOLN
1.0000 | Freq: Once | ORAL | 0 refills | Status: AC
Start: 2024-01-10 — End: 2024-01-10

## 2024-01-10 NOTE — Progress Notes (Signed)
 No egg or soy allergy known to patient  No issues known to pt with past sedation with any surgeries or procedures Patient denies ever being told they had issues or difficulty with intubation  No FH of Malignant Hyperthermia Pt is not on diet pills Pt is not on  home 02  Pt is not on blood thinners  Pt denies issues with constipation  No A fib or A flutter Have any cardiac testing pending--No Pt can ambulate, sometimes uses a cane  Pt denies use of chewing tobacco Discussed diabetic I weight loss medication holds Discussed NSAID holds Checked BMI Pt instructed to use Singlecare.com or GoodRx for a price reduction on prep  Patient's chart reviewed by Rogena Class CNRA prior to previsit and patient appropriate for the [facility].  Pre visit completed and red dot placed by patient's name on their procedure day (on provider's schedule).

## 2024-01-24 ENCOUNTER — Encounter: Payer: Self-pay | Admitting: Gastroenterology

## 2024-01-24 ENCOUNTER — Ambulatory Visit (AMBULATORY_SURGERY_CENTER): Payer: PRIVATE HEALTH INSURANCE | Admitting: Gastroenterology

## 2024-01-24 VITALS — BP 137/77 | HR 74 | Temp 98.6°F | Resp 17 | Ht 71.0 in | Wt 240.0 lb

## 2024-01-24 DIAGNOSIS — K648 Other hemorrhoids: Secondary | ICD-10-CM

## 2024-01-24 DIAGNOSIS — K5730 Diverticulosis of large intestine without perforation or abscess without bleeding: Secondary | ICD-10-CM | POA: Diagnosis not present

## 2024-01-24 DIAGNOSIS — D123 Benign neoplasm of transverse colon: Secondary | ICD-10-CM | POA: Diagnosis not present

## 2024-01-24 DIAGNOSIS — Z860100 Personal history of colon polyps, unspecified: Secondary | ICD-10-CM

## 2024-01-24 DIAGNOSIS — K6289 Other specified diseases of anus and rectum: Secondary | ICD-10-CM

## 2024-01-24 DIAGNOSIS — Z1211 Encounter for screening for malignant neoplasm of colon: Secondary | ICD-10-CM

## 2024-01-24 MED ORDER — SODIUM CHLORIDE 0.9 % IV SOLN: 500.0000 mL | Freq: Once | INTRAVENOUS | Status: DC

## 2024-01-24 NOTE — Progress Notes (Signed)
 [facility] History and Physical   Primary Care Physician:  Alto Atta, NP   Reason for Procedure:   History of colon polyps  Plan:    colonoscopy     HPI: Michael Church is a 68 y.o. male  here for colonoscopy surveillance - last exam 11/2018 - 3 adenomas removed.   Patient denies any bowel symptoms at this time. No family history of colon cancer known. Otherwise feels well without any cardiopulmonary symptoms.   I have discussed risks / benefits of anesthesia and endoscopic procedure with Sybil Euler and they wish to proceed with the exams as outlined today.    Past Medical History:  Diagnosis Date   Cancer (HCC)    Chronic rhinitis    followed by dr Seabron Cypress   GERD (gastroesophageal reflux disease)    Herniated lumbar intervertebral disc    not a problem presently   History of epistaxis    History of hiatal hernia    History of kidney stones    History of renal cell carcinoma 08/2016   urologist--- dr Dulcy Gibney;   s/p right partial nephrecotmy for papillary RCC, no chemo / radiation  (previously seen by oncology--- dr Marton Sleeper, lov note in epic 04-04-2017)   Hypertension    Incisional hernia    Nocturia    OA (osteoarthritis)    knees   Seasonal allergies    Type 2 diabetes mellitus (HCC)    followed by pcp---  (11-27-2021  per pt check blood sugar twice daily, fasting sugar---112--140)   Varicose vein of leg     Past Surgical History:  Procedure Laterality Date   CYSTECTOMY     per pt as child had a cyst removed that had was deep in chest wall   EXCISION MASS NECK  10/2011   posterior neck   INCISIONAL HERNIA REPAIR N/A 11/29/2021   Procedure: OPEN INCISIONAL HERNIA REPAIR WITH MESH;  Surgeon: Oza Blumenthal, MD;  Location: [facility];  Service: General;  Laterality: N/A;   KNEE ARTHROSCOPY W/ MENISCAL REPAIR Bilateral 03/26/2011   @[facility]  by dr Leighton Punches,  left knee//    right knee in 2017   MASS EXCISION N/A  09/11/2017   Procedure: MINOR EXCISION OF BACK MASS, EXCISION OF GLUTEAL MASS;  Surgeon: Shela Derby, MD;  Location: [facility];  Service: General;  Laterality: N/A;   ROBOTIC ASSITED PARTIAL NEPHRECTOMY Right 09/07/2016   Procedure: XI ROBOTIC ASSITED LAPAROSCOPIC RIGHT PARTIAL NEPHRECTOMY;  Surgeon: Andrez Banker, MD;  Location: [facility];  Service: Urology;  Laterality: Right;   TONSILLECTOMY     child   TOTAL KNEE ARTHROPLASTY Left 09/04/2023   Procedure: TOTAL KNEE ARTHROPLASTY;  Surgeon: Orvan Blanch, MD;  Location: [facility];  Service: Orthopedics;  Laterality: Left;   VARICOSE VEIN SURGERY Bilateral 01/2013   per pt stripping    Prior to Admission medications   Medication Sig Start Date End Date Taking? Authorizing Provider  ARGININE PO Take 1,000-2,000 mg by mouth See admin instructions. Take 1000 mg in the morning and 2000 mg in the afternoon 1000 mg   Yes [provider]  aspirin  EC 81 MG tablet Take 1 tablet (81 mg total) by mouth 2 (two) times daily after a meal. Day after surgery 09/04/23  Yes Orvan Blanch, MD  blood glucose meter kit and supplies KIT Dispense based on patient and insurance preference. Use up to three times daily as directed. 12/16/20  Yes Nafziger, Randel Buss, NP  Coenzyme Q10 (COQ10)  100 MG CAPS Take 100 mg by mouth daily.   Yes [provider]  Continuous Glucose Sensor (FREESTYLE LIBRE 3 SENSOR) MISC APPLY 1 SENSOR TOPICALLY TO SKIN AND CHANGE EVERY 14 DAYS (USE TO CHECK GLUCOSE CONTINUOUSLY) 12/27/23  Yes Nafziger, Randel Buss, NP  hydrochlorothiazide  (MICROZIDE ) 12.5 MG capsule TAKE 1 CAPSULE(12.5 MG) BY MOUTH DAILY 09/27/23  Yes Nafziger, Randel Buss, NP  metFORMIN  (GLUCOPHAGE -XR) 500 MG 24 hr tablet Take 2 tablets (1,000 mg total) by mouth 2 (two) times daily with a meal. 10/11/23 01/24/24 Yes Nafziger, Randel Buss, NP  cetirizine (ZYRTEC) 10 MG tablet Take 10 mg by mouth daily as needed for allergies.    [provider]  clindamycin  (CLEOCIN ) 150 MG capsule  Take 150 mg by mouth 4 (four) times daily. 12/16/23   [provider]  docusate sodium  (COLACE) 100 MG capsule Take 1 capsule (100 mg total) by mouth 2 (two) times daily. Patient not taking: Reported on 01/24/2024 09/04/23 09/03/24  Orvan Blanch, MD  EPINEPHrine  0.3 mg/0.3 mL IJ SOAJ injection Inject 0.3 mg into the muscle as needed. 09/08/21   [provider]  famotidine  (PEPCID ) 20 MG tablet Take 20 mg by mouth daily as needed. Patient not taking: Reported on 01/10/2024 08/28/21   [provider]  methocarbamol  (ROBAXIN ) 500 MG tablet Take 1 tablet (500 mg total) by mouth every 8 (eight) hours as needed for muscle spasms. Patient not taking: Reported on 01/24/2024 09/04/23   Orvan Blanch, MD  Multiple Vitamin (MULTIVITAMIN WITH MINERALS) TABS Take 1 tablet by mouth daily. Centrum +    [provider]  Multiple Vitamins-Minerals (PRESERVISION AREDS 2 PO) Take 1 tablet by mouth 2 (two) times daily.    [provider]  OVER THE COUNTER MEDICATION Take 1 tablet by mouth daily. turkesterone  testosterone supplement 1300 mg Patient not taking: Reported on 01/10/2024    [provider]  OVER THE COUNTER MEDICATION Take 1 capsule by mouth every morning. Ultra prostate formula Patient not taking: Reported on 01/10/2024    [provider]  oxyCODONE  (OXY IR/ROXICODONE ) 5 MG immediate release tablet Take 1 tablet (5 mg total) by mouth every 4 (four) hours as needed for severe pain (pain score 7-10). Patient not taking: Reported on 01/24/2024 09/04/23   Orvan Blanch, MD  oxymetazoline  (AFRIN) 0.05 % nasal spray Place 1 spray into both nostrils 2 (two) times daily as needed (nose bleeds).    [provider]  polyethylene glycol (MIRALAX  / GLYCOLAX ) 17 g packet Take 17 g by mouth daily. 09/04/23   Orvan Blanch, MD  PRESCRIPTION MEDICATION PER PT ALLERGY INJECTION  PER MONTH IN SUMMER    [provider]  simvastatin  (ZOCOR ) 5 MG  tablet Take 1 tablet (5 mg total) by mouth at bedtime. 04/11/23   Nafziger, Randel Buss, NP  sodium chloride  (OCEAN) 0.65 % SOLN nasal spray Place 1 spray into both nostrils as needed for congestion.    [provider]  tamsulosin  (FLOMAX ) 0.4 MG CAPS capsule Take 0.4 mg by mouth daily. 02/15/23   [provider]  triamcinolone  (NASACORT ) 55 MCG/ACT AERO nasal inhaler Place 2 sprays into the nose daily. 2 sprays each nostril at night Patient taking differently: Place 2 sprays into the nose daily as needed (Congestion and allergies). 03/07/21 09/04/23  Prescott Brodie, MD  TURMERIC PO Take 750 mg by mouth 2 (two) times daily.    [provider]    Current Outpatient Medications  Medication Sig Dispense Refill   ARGININE  PO Take 1,000-2,000 mg by mouth See admin instructions. Take 1000 mg in the morning and 2000 mg in the afternoon 1000 mg     aspirin  EC 81 MG tablet Take 1 tablet (81 mg total) by mouth 2 (two) times daily after a meal. Day after surgery 60 tablet 1   blood glucose meter kit and supplies KIT Dispense based on patient and insurance preference. Use up to three times daily as directed. 1 each 0   Coenzyme Q10 (COQ10) 100 MG CAPS Take 100 mg by mouth daily.     Continuous Glucose Sensor (FREESTYLE LIBRE 3 SENSOR) MISC APPLY 1 SENSOR TOPICALLY TO SKIN AND CHANGE EVERY 14 DAYS (USE TO CHECK GLUCOSE CONTINUOUSLY) 2 each 0   hydrochlorothiazide  (MICROZIDE ) 12.5 MG capsule TAKE 1 CAPSULE(12.5 MG) BY MOUTH DAILY 90 capsule 3   metFORMIN  (GLUCOPHAGE -XR) 500 MG 24 hr tablet Take 2 tablets (1,000 mg total) by mouth 2 (two) times daily with a meal. 360 tablet 1   cetirizine (ZYRTEC) 10 MG tablet Take 10 mg by mouth daily as needed for allergies.     clindamycin  (CLEOCIN ) 150 MG capsule Take 150 mg by mouth 4 (four) times daily.     docusate sodium  (COLACE) 100 MG capsule Take 1 capsule (100 mg total) by mouth 2 (two) times daily. (Patient not taking: Reported on 01/24/2024)  60 capsule 2   EPINEPHrine  0.3 mg/0.3 mL IJ SOAJ injection Inject 0.3 mg into the muscle as needed.     famotidine  (PEPCID ) 20 MG tablet Take 20 mg by mouth daily as needed. (Patient not taking: Reported on 01/10/2024)     methocarbamol  (ROBAXIN ) 500 MG tablet Take 1 tablet (500 mg total) by mouth every 8 (eight) hours as needed for muscle spasms. (Patient not taking: Reported on 01/24/2024) 30 tablet 1   Multiple Vitamin (MULTIVITAMIN WITH MINERALS) TABS Take 1 tablet by mouth daily. Centrum +     Multiple Vitamins-Minerals (PRESERVISION AREDS 2 PO) Take 1 tablet by mouth 2 (two) times daily.     OVER THE COUNTER MEDICATION Take 1 tablet by mouth daily. turkesterone  testosterone supplement 1300 mg (Patient not taking: Reported on 01/10/2024)     OVER THE COUNTER MEDICATION Take 1 capsule by mouth every morning. Ultra prostate formula (Patient not taking: Reported on 01/10/2024)     oxyCODONE  (OXY IR/ROXICODONE ) 5 MG immediate release tablet Take 1 tablet (5 mg total) by mouth every 4 (four) hours as needed for severe pain (pain score 7-10). (Patient not taking: Reported on 01/24/2024) 40 tablet 0   oxymetazoline  (AFRIN) 0.05 % nasal spray Place 1 spray into both nostrils 2 (two) times daily as needed (nose bleeds).     polyethylene glycol (MIRALAX  / GLYCOLAX ) 17 g packet Take 17 g by mouth daily. 14 each 0   PRESCRIPTION MEDICATION PER PT ALLERGY INJECTION  PER MONTH IN SUMMER     simvastatin  (ZOCOR ) 5 MG tablet Take 1 tablet (5 mg total) by mouth at bedtime. 90 tablet 3   sodium chloride  (OCEAN) 0.65 % SOLN nasal spray Place 1 spray into both nostrils as needed for congestion.     tamsulosin  (FLOMAX ) 0.4 MG CAPS capsule Take 0.4 mg by mouth daily.     triamcinolone  (NASACORT ) 55 MCG/ACT AERO nasal inhaler Place 2 sprays into the nose daily. 2 sprays each nostril at night (Patient taking differently: Place 2 sprays into the nose daily as needed (Congestion and allergies).) 1 each 12   TURMERIC PO Take 750  mg by mouth 2 (two) times daily.     Current Facility-Administered Medications  Medication Dose Route Frequency Provider Last Rate Last Admin   0.9 %  sodium chloride  infusion  500 mL Intravenous Once Imraan Wendell, Lendon Queen, MD        Allergies as of 01/24/2024 - Review Complete 01/24/2024  Allergen Reaction Noted   Mixed ragweed Other (See Comments) 08/26/2023   Other Itching 03/17/2012   Penicillins Itching 02/04/2012    Family History  Problem Relation Age of Onset   Stroke Father        age 31   Colon polyps Sister    Colon polyps Brother    Cancer Brother        Hodgkin   HIV/AIDS Brother        age 30    Colon cancer Neg Hx    Esophageal cancer Neg Hx    Rectal cancer Neg Hx    Stomach cancer Neg Hx     Social History   Socioeconomic History   Marital status: Married    Spouse name: Cain Castillo   Number of children: 2   Years of education: Not on file   Highest education level: Bachelor's degree (e.g., BA, AB, BS)  Occupational History   Occupation: Code and compliance    Employer: [employer]  Tobacco Use   Smoking status: Some Days    Types: Cigars   Smokeless tobacco: Never   Tobacco comments:    11-27-2021  Per pt average one cigar every 2-3 months  Vaping Use   Vaping status: Never Used  Substance and Sexual Activity   Alcohol use: Yes    Comment: occasional   Drug use: No   Sexual activity: Yes  Other Topics Concern   Not on file  Social History Narrative   Retired from post office    Works part time for the [employer]    Social Drivers of [employer]: Low Risk  (03/26/2022)   Overall Financial Resource Strain (CARDIA)    Difficulty of Paying Living Expenses: Not hard at all  Food Insecurity: No Food Insecurity (09/04/2023)   Hunger Vital Sign    Worried About Running Out of Food in the Last Year: Never true    Ran Out of Food in the Last Year: Never true  Transportation Needs: No Transportation Needs  (09/04/2023)   PRAPARE - Administrator, Civil Service (Medical): No    Lack of Transportation (Non-Medical): No  Physical Activity: Insufficiently Active (03/26/2022)   Exercise Vital Sign    Days of Exercise per Week: 4 days    Minutes of Exercise per Session: 20 min  Stress: Patient Declined (03/26/2022)   [employer] of Occupational Health - Occupational Stress Questionnaire    Feeling of Stress : Patient declined  Social Connections: Unknown (03/26/2022)   Social Connection and Isolation Panel [NHANES]    Frequency of Communication with Friends and Family: Once a week    Frequency of Social Gatherings with Friends and Family: Patient declined    Attends Religious Services: Patient declined    Database administrator or Organizations: Yes    Attends Banker Meetings: Patient declined    Marital Status: Married  Catering manager Violence: Not At Risk (09/04/2023)   Humiliation, Afraid, Rape, and Kick questionnaire    Fear of Current or Ex-Partner: No    Emotionally Abused: No    Physically Abused: No  Sexually Abused: No    Review of Systems: All other review of systems negative except as mentioned in the HPI.  Physical Exam: Vital signs BP 118/64   Pulse 84   Temp 98.6 F (37 C)   Ht 5\' 11"  (1.803 m)   Wt 240 lb (108.9 kg)   SpO2 98%   BMI 33.47 kg/m   General:   Alert,  Well-developed, pleasant and cooperative in NAD Lungs:  Clear throughout to auscultation.   Heart:  Regular rate and rhythm Abdomen:  Soft, nontender and nondistended.   Neuro/Psych:  Alert and cooperative. Normal mood and affect. A and O x 3  Christi Coward, MD [facility]

## 2024-01-24 NOTE — Op Note (Addendum)
 [facility] Patient Name: Michael Church Procedure Date: 01/24/2024 10:38 AM MRN: [id number] Endoscopist: Landon Pinion P. General Kenner , MD, [id number] Age: 68 Referring MD:  Date of Birth: 05-29-1956 Gender: Male Account #: [id number] Procedure:                Colonoscopy Indications:              High risk colon cancer surveillance: Personal                            history of colonic polyps - 3 adenomas removed                            11/2018 Medicines:                Monitored Anesthesia Care Procedure:                Pre-Anesthesia Assessment:                           - Prior to the procedure, a History and Physical                            was performed, and patient medications and                            allergies were reviewed. The patient's tolerance of                            previous anesthesia was also reviewed. The risks                            and benefits of the procedure and the sedation                            options and risks were discussed with the patient.                            All questions were answered, and informed consent                            was obtained. Prior Anticoagulants: The patient has                            taken no anticoagulant or antiplatelet agents. ASA                            Grade Assessment: II - A patient with mild systemic                            disease. After reviewing the risks and benefits,                            the patient was deemed in satisfactory condition to  undergo the procedure.                           After obtaining informed consent, the colonoscope                            was passed under direct vision. Throughout the                            procedure, the patient's blood pressure, pulse, and                            oxygen saturations were monitored continuously. The                            Olympus Scope [id number] was introduced through the                             anus and advanced to the the cecum, identified by                            appendiceal orifice and ileocecal valve. The                            colonoscopy was performed without difficulty. The                            patient tolerated the procedure well. The quality                            of the bowel preparation was good. The ileocecal                            valve, appendiceal orifice, and rectum were                            photographed. Scope In: 10:43:02 AM Scope Out: 10:58:11 AM Scope Withdrawal Time: 0 hours 12 minutes 43 seconds  Total Procedure Duration: 0 hours 15 minutes 9 seconds  Findings:                 The perianal and digital rectal examinations were                            normal.                           A 3 mm polyp was found in the transverse colon. The                            polyp was sessile. The polyp was removed with a                            cold snare. Resection and retrieval were complete.  Multiple diverticula were found in the sigmoid                            colon. There was superficial erythema noted in                            certain areas likely related to diverticular                            changes.                           Anal papilla(e) were hypertrophied.                           Internal hemorrhoids were found during retroflexion.                           The exam was otherwise without abnormality. Complications:            No immediate complications. Estimated blood loss:                            Minimal. Estimated Blood Loss:     Estimated blood loss was minimal. Impression:               - One 3 mm polyp in the transverse colon, removed                            with a cold snare. Resected and retrieved.                           - Diverticulosis in the sigmoid colon.                           - Anal papilla(e) were hypertrophied.                            - Internal hemorrhoids.                           - The examination was otherwise normal. Recommendation:           - Patient has a contact number available for                            emergencies. The signs and symptoms of potential                            delayed complications were discussed with the                            patient. Return to normal activities tomorrow.                            Written discharge instructions were provided to the  patient.                           - Resume previous diet.                           - Continue present medications.                           - Await pathology results. Landon Pinion P. [name], MD 01/24/2024 11:02:15 AM This report has been signed electronically.

## 2024-01-24 NOTE — Progress Notes (Signed)
 Report to PACU, RN, vss, BBS= Clear.

## 2024-01-24 NOTE — Progress Notes (Signed)
 Pt's states no medical or surgical changes since previsit or office visit.

## 2024-01-24 NOTE — Patient Instructions (Signed)
Await pathology results.   Resume previous diet. Continue present medications.  Handout on polyps, diverticulosis, and hemorrhoids provided.  YOU HAD AN ENDOSCOPIC PROCEDURE TODAY AT THE [facility]:   Refer to the procedure report that was given to you for any specific questions about what was found during the examination.  If the procedure report does not answer your questions, please call your gastroenterologist to clarify.  If you requested that your care partner not be given the details of your procedure findings, then the procedure report has been included in a sealed envelope for you to review at your convenience later.  YOU SHOULD EXPECT: Some feelings of bloating in the abdomen. Passage of more gas than usual.  Walking can help get rid of the air that was put into your GI tract during the procedure and reduce the bloating. If you had a lower endoscopy (such as a colonoscopy or flexible sigmoidoscopy) you may notice spotting of blood in your stool or on the toilet paper. If you underwent a bowel prep for your procedure, you may not have a normal bowel movement for a few days.  Please Note:  You might notice some irritation and congestion in your nose or some drainage.  This is from the oxygen used during your procedure.  There is no need for concern and it should clear up in a day or so.  SYMPTOMS TO REPORT IMMEDIATELY:  Following lower endoscopy (colonoscopy or flexible sigmoidoscopy):  Excessive amounts of blood in the stool  Significant tenderness or worsening of abdominal pains  Swelling of the abdomen that is new, acute  Fever of 100F or higher   For urgent or emergent issues, a gastroenterologist can be reached at any hour by calling [phone]. Do not use MyChart messaging for urgent concerns.    DIET:  We do recommend a small meal at first, but then you may proceed to your regular diet.  Drink plenty of fluids but you should avoid alcoholic beverages for 24  hours.  ACTIVITY:  You should plan to take it easy for the rest of today and you should NOT DRIVE or use heavy machinery until tomorrow (because of the sedation medicines used during the test).    FOLLOW UP: Our staff will call the number listed on your records the next business day following your procedure.  We will call around 7:15- 8:00 am to check on you and address any questions or concerns that you may have regarding the information given to you following your procedure. If we do not reach you, we will leave a message.     If any biopsies were taken you will be contacted by phone or by letter within the next 1-3 weeks.  Please call us at [phone] if you have not heard about the biopsies in 3 weeks.    SIGNATURES/CONFIDENTIALITY: You and/or your care partner have signed paperwork which will be entered into your electronic medical record.  These signatures attest to the fact that that the information above on your After Visit Summary has been reviewed and is understood.  Full responsibility of the confidentiality of this discharge information lies with you and/or your care-partner.  

## 2024-01-27 ENCOUNTER — Telehealth: Payer: Self-pay

## 2024-01-27 NOTE — Telephone Encounter (Signed)
  Follow up Call-     01/24/2024    9:59 AM  Call back number  Post procedure Call Back phone  # [phone]  Permission to leave phone message Yes     Patient questions:  Do you have a fever, pain , or abdominal swelling? No. Pain Score  0 *  Have you tolerated food without any problems? Yes.    Have you been able to return to your normal activities? Yes.    Do you have any questions about your discharge instructions: Diet   No. Medications  No. Follow up visit  No.  Do you have questions or concerns about your Care? No.  Actions: * If pain score is 4 or above: No action needed, pain <4.

## 2024-01-28 LAB — SURGICAL PATHOLOGY

## 2024-01-29 ENCOUNTER — Other Ambulatory Visit: Payer: Self-pay | Admitting: Adult Health

## 2024-01-30 ENCOUNTER — Ambulatory Visit: Payer: Self-pay | Admitting: Gastroenterology

## 2024-02-18 LAB — HM DIABETES EYE EXAM

## 2024-03-03 ENCOUNTER — Other Ambulatory Visit: Payer: Self-pay | Admitting: Adult Health

## 2024-04-02 ENCOUNTER — Other Ambulatory Visit: Payer: Self-pay | Admitting: Adult Health

## 2024-04-14 ENCOUNTER — Encounter: Payer: Self-pay | Admitting: Adult Health

## 2024-04-14 ENCOUNTER — Ambulatory Visit (INDEPENDENT_AMBULATORY_CARE_PROVIDER_SITE_OTHER): Payer: No Typology Code available for payment source | Admitting: Adult Health

## 2024-04-14 VITALS — BP 136/80 | HR 74 | Temp 98.0°F | Ht 71.0 in | Wt 245.0 lb

## 2024-04-14 DIAGNOSIS — N400 Enlarged prostate without lower urinary tract symptoms: Secondary | ICD-10-CM

## 2024-04-14 DIAGNOSIS — E119 Type 2 diabetes mellitus without complications: Secondary | ICD-10-CM | POA: Diagnosis not present

## 2024-04-14 DIAGNOSIS — I10 Essential (primary) hypertension: Secondary | ICD-10-CM

## 2024-04-14 DIAGNOSIS — Z0000 Encounter for general adult medical examination without abnormal findings: Secondary | ICD-10-CM

## 2024-04-14 DIAGNOSIS — E782 Mixed hyperlipidemia: Secondary | ICD-10-CM

## 2024-04-14 DIAGNOSIS — Z7984 Long term (current) use of oral hypoglycemic drugs: Secondary | ICD-10-CM

## 2024-04-14 LAB — HEMOGLOBIN A1C: Hgb A1c MFr Bld: 6.5 % (ref 4.6–6.5)

## 2024-04-14 LAB — CBC
HCT: 45.6 % (ref 39.0–52.0)
Hemoglobin: 15 g/dL (ref 13.0–17.0)
MCHC: 32.9 g/dL (ref 30.0–36.0)
MCV: 86.5 fl (ref 78.0–100.0)
Platelets: 254 K/uL (ref 150.0–400.0)
RBC: 5.27 Mil/uL (ref 4.22–5.81)
RDW: 13.6 % (ref 11.5–15.5)
WBC: 3.5 K/uL — ABNORMAL LOW (ref 4.0–10.5)

## 2024-04-14 LAB — LIPID PANEL
Cholesterol: 157 mg/dL (ref 0–200)
HDL: 70.1 mg/dL (ref >39.00)
LDL Cholesterol: 74 mg/dL (ref 0–99)
NonHDL: 86.76
Total CHOL/HDL Ratio: 2
Triglycerides: 65 mg/dL (ref 0.0–149.0)
VLDL: 13 mg/dL (ref 0.0–40.0)

## 2024-04-14 LAB — COMPREHENSIVE METABOLIC PANEL WITH GFR
ALT: 21 U/L (ref 0–53)
AST: 26 U/L (ref 0–37)
Albumin: 4.8 g/dL (ref 3.5–5.2)
Alkaline Phosphatase: 38 U/L — ABNORMAL LOW (ref 39–117)
BUN: 13 mg/dL (ref 6–23)
CO2: 28 meq/L (ref 19–32)
Calcium: 9.6 mg/dL (ref 8.4–10.5)
Chloride: 98 meq/L (ref 96–112)
Creatinine, Ser: 0.98 mg/dL (ref 0.40–1.50)
GFR: 80.56 mL/min (ref >60.00)
Glucose, Bld: 99 mg/dL (ref 70–99)
Potassium: 4.3 meq/L (ref 3.5–5.1)
Sodium: 139 meq/L (ref 135–145)
Total Bilirubin: 0.7 mg/dL (ref 0.2–1.2)
Total Protein: 7.3 g/dL (ref 6.0–8.3)

## 2024-04-14 LAB — TSH: TSH: 1.55 u[IU]/mL (ref 0.35–5.50)

## 2024-04-14 LAB — MICROALBUMIN / CREATININE URINE RATIO
Creatinine,U: 71.1 mg/dL
Microalb Creat Ratio: 16.6 mg/g (ref 0.0–30.0)
Microalb, Ur: 1.2 mg/dL (ref 0.0–1.9)

## 2024-04-14 LAB — PSA: PSA: 1.95 ng/mL (ref 0.10–4.00)

## 2024-04-14 NOTE — Patient Instructions (Signed)
 It was great seeing you today   We will follow up with you regarding your lab work   Please let me know if you need anything

## 2024-04-14 NOTE — Progress Notes (Signed)
 Subjective:    Patient ID: Michael Church, male    DOB: 28-Apr-1956, 68 y.o.   MRN: [id number]  HPI Patient presents for yearly preventative medicine examination. He is a pleasant 68 year old male who  has a past medical history of Cancer (HCC), Chronic rhinitis, GERD (gastroesophageal reflux disease), Herniated lumbar intervertebral disc, History of epistaxis, History of hiatal hernia, History of kidney stones, History of renal cell carcinoma (08/2016), Hypertension, Incisional hernia, Nocturia, OA (osteoarthritis), Seasonal allergies, Type 2 diabetes mellitus (HCC), and Varicose vein of leg.  HTN -managed with HCTZ 12.5 mg daily.  He denies dizziness, lightheadedness, chest pain, shortness of breath, or syncopal episodes. He does monitor his BP at home with readings in the 120/80's ,His BP elevated in the office BP Readings from Last 3 Encounters:  04/14/24 (!) 140/82  01/24/24 137/77  10/11/23 123/78   Diabetes mellitus type 2-managed with metformin  1000 mg twice daily.  He had been using the [place] 3 sensor to monitor his blood sugars. He has cut out sweets and is on a carb restricted diet. He has lost 24 pounds since he was last seen in the office 3 months ago. He has had dizziness from low blood sugars. His CGM shows 18 lows in the last 90 days.  Lab Results  Component Value Date   HGBA1C 5.9 (A) 10/11/2023   HGBA1C 7.2 (H) 08/02/2023   HGBA1C 6.7 (A) 02/19/2023    CONTINUOUS GLUCOSE MONITORING RECORD INTERPRETATION                 Dates of Recording: May 1 - July 29th 2025    Sensor description: freestyle libre    Results statistics:   CGM use % of time 91  Average  123  Time in range 90  % Time Above 180 9  % Time above 250 1  % Time Below target 0   Hyperlipidemia - managed with simvastatin  5 mg daily  Lab Results  Component Value Date   CHOL 156 04/11/2023   HDL 64.50 04/11/2023   LDLCALC 78 04/11/2023   TRIG 71.0 04/11/2023   CHOLHDL 2 04/11/2023    BPH -  managed by urology with Flomax  0.4 mg daily. Feels well controlled.   All immunizations and health maintenance protocols were reviewed with the patient and needed orders were placed.  Appropriate screening laboratory values were ordered for the patient including screening of hyperlipidemia, renal function and hepatic function. If indicated by BPH, a PSA was ordered.  Medication reconciliation,  past medical history, social history, problem list and allergies were reviewed in detail with the patient  Goals were established with regard to weight loss, exercise, and  diet in compliance with medications. He is exercising more at the gym and is walking Th- Sunday.  Wt Readings from Last 3 Encounters:  04/14/24 245 lb (111.1 kg)  01/24/24 240 lb (108.9 kg)  01/10/24 240 lb (108.9 kg)   He is up to date on routine colon cancer screening   Review of Systems  Constitutional: Negative.   HENT: Negative.    Eyes: Negative.   Respiratory: Negative.    Cardiovascular: Negative.   Gastrointestinal: Negative.   Endocrine: Negative.   Genitourinary: Negative.   Musculoskeletal: Negative.   Skin: Negative.   Allergic/Immunologic: Negative.   Neurological: Negative.   Hematological: Negative.   Psychiatric/Behavioral: Negative.    All other systems reviewed and are negative.  Past Medical History:  Diagnosis Date   Cancer (HCC)  Chronic rhinitis    followed by dr fleeta smock   GERD (gastroesophageal reflux disease)    Herniated lumbar intervertebral disc    not a problem presently   History of epistaxis    History of hiatal hernia    History of kidney stones    History of renal cell carcinoma 08/2016   urologist--- dr cam;   s/p right partial nephrecotmy for papillary RCC, no chemo / radiation  (previously seen by oncology--- dr lonn, lov note in epic 04-04-2017)   Hypertension    Incisional hernia    Nocturia    OA (osteoarthritis)    knees   Seasonal allergies    Type 2  diabetes mellitus (HCC)    followed by pcp---  (11-27-2021  per pt check blood sugar twice daily, fasting sugar---112--140)   Varicose vein of leg     Social History   Socioeconomic History   Marital status: Married    Spouse name: Alfonso   Number of children: 2   Years of education: Not on file   Highest education level: Bachelor's degree (e.g., BA, AB, BS)  Occupational History   Occupation: Code and compliance    Employer: [employer]  Tobacco Use   Smoking status: Some Days    Types: Cigars   Smokeless tobacco: Never   Tobacco comments:    04/14/2024 Per pt average one cigar every 2-3 months year  Vaping Use   Vaping status: Never Used  Substance and Sexual Activity   Alcohol use: Yes    Comment: occasional beer and liquor   Drug use: No   Sexual activity: Yes  Other Topics Concern   Not on file  Social History Narrative   Retired from post office    Works part time for the [employer]    Social Drivers of [employer]: Low Risk  (03/26/2022)   Overall Financial Resource Strain (CARDIA)    Difficulty of Paying Living Expenses: Not hard at all  Food Insecurity: No Food Insecurity (09/04/2023)   Hunger Vital Sign    Worried About Running Out of Food in the Last Year: Never true    Ran Out of Food in the Last Year: Never true  Transportation Needs: No Transportation Needs (09/04/2023)   PRAPARE - Administrator, Civil Service (Medical): No    Lack of Transportation (Non-Medical): No  Physical Activity: Insufficiently Active (03/26/2022)   Exercise Vital Sign    Days of Exercise per Week: 4 days    Minutes of Exercise per Session: 20 min  Stress: Patient Declined (03/26/2022)   [employer] of Occupational Health - Occupational Stress Questionnaire    Feeling of Stress : Patient declined  Social Connections: Unknown (03/26/2022)   Social Connection and Isolation Panel    Frequency of Communication with Friends  and Family: Once a week    Frequency of Social Gatherings with Friends and Family: Patient declined    Attends Religious Services: Patient declined    Database administrator or Organizations: Yes    Attends Banker Meetings: Patient declined    Marital Status: Married  Catering manager Violence: Not At Risk (09/04/2023)   Humiliation, Afraid, Rape, and Kick questionnaire    Fear of Current or Ex-Partner: No    Emotionally Abused: No    Physically Abused: No    Sexually Abused: No    Past Surgical History:  Procedure Laterality Date   CYSTECTOMY  per pt as child had a cyst removed that had was deep in chest wall   EXCISION MASS NECK  10/2011   posterior neck   INCISIONAL HERNIA REPAIR N/A 11/29/2021   Procedure: OPEN INCISIONAL HERNIA REPAIR WITH MESH;  Surgeon: Vernetta Berg, MD;  Location: [facility] [place];  Service: General;  Laterality: N/A;   KNEE ARTHROSCOPY W/ MENISCAL REPAIR Bilateral 03/26/2011   @[facility]  by dr duwayne,  left knee//    right knee in 2017   MASS EXCISION N/A 09/11/2017   Procedure: MINOR EXCISION OF BACK MASS, EXCISION OF GLUTEAL MASS;  Surgeon: Rubin Calamity, MD;  Location: [facility];  Service: General;  Laterality: N/A;   ROBOTIC ASSITED PARTIAL NEPHRECTOMY Right 09/07/2016   Procedure: XI ROBOTIC ASSITED LAPAROSCOPIC RIGHT PARTIAL NEPHRECTOMY;  Surgeon: Morene LELON Salines, MD;  Location: [facility];  Service: Urology;  Laterality: Right;   TONSILLECTOMY     child   TOTAL KNEE ARTHROPLASTY Left 09/04/2023   Procedure: TOTAL KNEE ARTHROPLASTY;  Surgeon: duwayne Purchase, MD;  Location: [facility];  Service: Orthopedics;  Laterality: Left;   VARICOSE VEIN SURGERY Bilateral 01/2013   per pt stripping    Family History  Problem Relation Age of Onset   Stroke Father        age 66   Colon polyps Sister    Colon polyps Brother    Cancer Brother        Hodgkin   HIV/AIDS Brother        age 69    Colon cancer Neg Hx    Esophageal cancer  Neg Hx    Rectal cancer Neg Hx    Stomach cancer Neg Hx     Allergies  Allergen Reactions   Mixed Ragweed Other (See Comments)    Aggravate allergies   Other Itching    Foods - cantaloupes, tomatoes, bananas, and  raw carrots.   Penicillins Itching    Has patient had a PCN reaction causing immediate rash, facial/tongue/throat swelling, SOB or lightheadedness with hypotension: No Has patient had a PCN reaction causing severe rash involving mucus membranes or skin necrosis: No Has patient had a PCN reaction that required hospitalization: No Has patient had a PCN reaction occurring within the last 10 years: No If all of the above answers are NO, then may proceed with Cephalosporin use.     Current Outpatient Medications on File Prior to Visit  Medication Sig Dispense Refill   ARGININE PO Take 1,000-2,000 mg by mouth See admin instructions. Take 1000 mg in the morning and 2000 mg in the afternoon 1000 mg     aspirin  EC 81 MG tablet Take 1 tablet (81 mg total) by mouth 2 (two) times daily after a meal. Day after surgery 60 tablet 1   blood glucose meter kit and supplies KIT Dispense based on patient and insurance preference. Use up to three times daily as directed. 1 each 0   cetirizine (ZYRTEC) 10 MG tablet Take 10 mg by mouth daily as needed for allergies.     clindamycin  (CLEOCIN ) 150 MG capsule Take 150 mg by mouth 4 (four) times daily.     Coenzyme Q10 (COQ10) 100 MG CAPS Take 100 mg by mouth daily.     Continuous Glucose Sensor (FREESTYLE LIBRE 3 PLUS SENSOR) MISC APPLY ONE SENSOR TOPICALLY TO SKIN TO CHECK GLUCOSE CONTINUOUSLY; CHANGE EVERY FIVETEEN DAYS 2 each 3   docusate sodium  (COLACE) 100 MG capsule Take 1 capsule (100 mg total) by mouth 2 (  two) times daily. (Patient not taking: Reported on 01/24/2024) 60 capsule 2   EPINEPHrine  0.3 mg/0.3 mL IJ SOAJ injection Inject 0.3 mg into the muscle as needed.     famotidine  (PEPCID ) 20 MG tablet Take 20 mg by mouth daily as needed.  (Patient not taking: Reported on 01/10/2024)     hydrochlorothiazide  (MICROZIDE ) 12.5 MG capsule TAKE 1 CAPSULE(12.5 MG) BY MOUTH DAILY 90 capsule 3   metFORMIN  (GLUCOPHAGE -XR) 500 MG 24 hr tablet Take 2 tablets (1,000 mg total) by mouth 2 (two) times daily with a meal. 360 tablet 1   methocarbamol  (ROBAXIN ) 500 MG tablet Take 1 tablet (500 mg total) by mouth every 8 (eight) hours as needed for muscle spasms. (Patient not taking: Reported on 01/24/2024) 30 tablet 1   Multiple Vitamin (MULTIVITAMIN WITH MINERALS) TABS Take 1 tablet by mouth daily. Centrum +     Multiple Vitamins-Minerals (PRESERVISION AREDS 2 PO) Take 1 tablet by mouth 2 (two) times daily.     OVER THE COUNTER MEDICATION Take 1 tablet by mouth daily. turkesterone  testosterone supplement 1300 mg (Patient not taking: Reported on 01/10/2024)     OVER THE COUNTER MEDICATION Take 1 capsule by mouth every morning. Ultra prostate formula (Patient not taking: Reported on 01/10/2024)     oxyCODONE  (OXY IR/ROXICODONE ) 5 MG immediate release tablet Take 1 tablet (5 mg total) by mouth every 4 (four) hours as needed for severe pain (pain score 7-10). (Patient not taking: Reported on 01/24/2024) 40 tablet 0   oxymetazoline  (AFRIN) 0.05 % nasal spray Place 1 spray into both nostrils 2 (two) times daily as needed (nose bleeds).     polyethylene glycol (MIRALAX  / GLYCOLAX ) 17 g packet Take 17 g by mouth daily. 14 each 0   PRESCRIPTION MEDICATION PER PT ALLERGY INJECTION  PER MONTH IN SUMMER     simvastatin  (ZOCOR ) 5 MG tablet Take 1 tablet (5 mg total) by mouth at bedtime. 90 tablet 3   sodium chloride  (OCEAN) 0.65 % SOLN nasal spray Place 1 spray into both nostrils as needed for congestion.     tamsulosin  (FLOMAX ) 0.4 MG CAPS capsule Take 0.4 mg by mouth daily.     triamcinolone  (NASACORT ) 55 MCG/ACT AERO nasal inhaler Place 2 sprays into the nose daily. 2 sprays each nostril at night (Patient taking differently: Place 2 sprays into the nose daily as needed  (Congestion and allergies).) 1 each 12   TURMERIC PO Take 750 mg by mouth 2 (two) times daily.     No current facility-administered medications on file prior to visit.    BP (!) 140/82   Pulse 74   Temp 98 F (36.7 C) (Oral)   Ht 5' 11 (1.803 m)   Wt 245 lb (111.1 kg)   SpO2 98%   BMI 34.17 kg/m       Objective:   Physical Exam Vitals and nursing note reviewed.  Constitutional:      General: He is not in acute distress.    Appearance: Normal appearance. He is obese. He is not ill-appearing.  HENT:     Head: Normocephalic and atraumatic.     Right Ear: Tympanic membrane, ear canal and external ear normal. There is no impacted cerumen.     Left Ear: Tympanic membrane, ear canal and external ear normal. There is no impacted cerumen.     Nose: Nose normal. No congestion or rhinorrhea.     Mouth/Throat:     Mouth: Mucous membranes are moist.  Pharynx: Oropharynx is clear.  Eyes:     Extraocular Movements: Extraocular movements intact.     Conjunctiva/sclera: Conjunctivae normal.     Pupils: Pupils are equal, round, and reactive to light.  Neck:     Vascular: No carotid bruit.  Cardiovascular:     Rate and Rhythm: Normal rate and regular rhythm.     Pulses: Normal pulses.     Heart sounds: No murmur heard.    No friction rub. No gallop.  Pulmonary:     Effort: Pulmonary effort is normal.     Breath sounds: Normal breath sounds.  Abdominal:     General: Abdomen is flat. Bowel sounds are normal. There is no distension.     Palpations: Abdomen is soft. There is no mass.     Tenderness: There is no abdominal tenderness. There is no guarding or rebound.     Hernia: No hernia is present.  Musculoskeletal:        General: Normal range of motion.     Cervical back: Normal range of motion and neck supple.  Lymphadenopathy:     Cervical: No cervical adenopathy.  Skin:    General: Skin is warm and dry.     Capillary Refill: Capillary refill takes less than 2 seconds.   Neurological:     General: No focal deficit present.     Mental Status: He is alert and oriented to person, place, and time.     Sensory: No sensory deficit.  Psychiatric:        Mood and Affect: Mood normal.        Behavior: Behavior normal.        Thought Content: Thought content normal.        Judgment: Judgment normal.           Assessment & Plan:  1. Routine general medical examination at a health care facility (Primary) Today patient counseled on age appropriate routine health concerns for screening and prevention, each reviewed and up to date or declined. Immunizations reviewed and up to date or declined. Labs ordered and reviewed. Risk factors for depression reviewed and negative. Hearing function and visual acuity are intact. ADLs screened and addressed as needed. Functional ability and level of safety reviewed and appropriate. Education, counseling and referrals performed based on assessed risks today. Patient provided with a copy of personalized plan for preventive services. - Continue to work on lifestyle modifications  - Follow up in one year or sooner if needed  2. Diabetes mellitus treated with oral medication (HCC) - Will decrease his Metformin  dose to 500 mg BID  - Follow up in 3 months  - Lipid panel; Future - TSH; Future - CBC; Future - Comprehensive metabolic panel with GFR; Future - Hemoglobin A1c; Future - Microalbumin/Creatinine Ratio, Urine; Future  3. Essential hypertension - Controlled. No change in medication  - Lipid panel; Future - TSH; Future - CBC; Future - Comprehensive metabolic panel with GFR; Future  4. Mixed hyperlipidemia - Consider increase in statin  - Lipid panel; Future - TSH; Future - CBC; Future - Comprehensive metabolic panel with GFR; Future  5. Benign prostatic hyperplasia without lower urinary tract symptoms - Continue with Flomax  - PSA; Future  Darleene Shape, NP

## 2024-04-15 ENCOUNTER — Ambulatory Visit: Payer: Self-pay | Admitting: Adult Health

## 2024-05-22 ENCOUNTER — Other Ambulatory Visit: Payer: Self-pay | Admitting: Adult Health

## 2024-05-22 DIAGNOSIS — E119 Type 2 diabetes mellitus without complications: Secondary | ICD-10-CM

## 2024-05-22 DIAGNOSIS — E782 Mixed hyperlipidemia: Secondary | ICD-10-CM

## 2024-06-12 ENCOUNTER — Encounter: Payer: Self-pay | Admitting: Gastroenterology

## 2024-07-15 ENCOUNTER — Ambulatory Visit: Admitting: Adult Health

## 2024-07-23 ENCOUNTER — Ambulatory Visit: Admitting: Adult Health

## 2024-07-23 ENCOUNTER — Encounter: Payer: Self-pay | Admitting: Adult Health

## 2024-07-23 ENCOUNTER — Other Ambulatory Visit: Payer: Self-pay | Admitting: Adult Health

## 2024-07-23 VITALS — BP 138/82 | HR 73 | Temp 98.3°F | Ht 71.0 in | Wt 248.0 lb

## 2024-07-23 DIAGNOSIS — E119 Type 2 diabetes mellitus without complications: Secondary | ICD-10-CM

## 2024-07-23 DIAGNOSIS — Z7984 Long term (current) use of oral hypoglycemic drugs: Secondary | ICD-10-CM | POA: Diagnosis not present

## 2024-07-23 DIAGNOSIS — I10 Essential (primary) hypertension: Secondary | ICD-10-CM | POA: Diagnosis not present

## 2024-07-23 LAB — POCT GLYCOSYLATED HEMOGLOBIN (HGB A1C): Hemoglobin A1C: 6.7 % — AB (ref 4.0–5.6)

## 2024-07-23 NOTE — Patient Instructions (Signed)
 Health Maintenance Due  Topic Date Due   DTaP/Tdap/Td (2 - Td or Tdap) 01/13/2024   Influenza Vaccine  04/17/2024   COVID-19 Vaccine (6 - 2025-26 season) 05/18/2024       08/02/2023    2:14 PM 01/11/2023   11:27 AM 09/22/2021    9:15 AM  Depression screen PHQ 2/9  Decreased Interest 0 0 0  Down, Depressed, Hopeless 0 0 0  PHQ - 2 Score 0 0 0

## 2024-07-23 NOTE — Progress Notes (Signed)
 Subjective:    Patient ID: Michael Church, male    DOB: [birth year], 68 y.o.   MRN: [id number]  HPI 68 year old male who  has a past medical history of Cancer (HCC), Chronic rhinitis, GERD (gastroesophageal reflux disease), Herniated lumbar intervertebral disc, History of epistaxis, History of hiatal hernia, History of kidney stones, History of renal cell carcinoma (08/2016), Hypertension, Incisional hernia, Nocturia, OA (osteoarthritis), Seasonal allergies, Type 2 diabetes mellitus (HCC), and Varicose vein of leg.  He presents to the office today for follow up regarding DM and HTN   HTN -managed with HCTZ 12.5 mg daily.  He denies dizziness, lightheadedness, chest pain, shortness of breath, or syncopal episodes. He does monitor his BP at home with readings in the 120/80's ,His BP elevated in the office BP Readings from Last 3 Encounters:  07/23/24 138/82  04/14/24 136/80  01/24/24 137/77   Diabetes mellitus type 2-managed with metformin  500 mg twice daily.  He had been using the [place] 3 sensor to monitor his blood sugars. His diet has suffered and so has his exercise over the last three months, reports losing his dog of 14 years  Lab Results  Component Value Date   HGBA1C 6.7 (A) 07/23/2024   HGBA1C 6.5 04/14/2024   HGBA1C 5.9 (A) 10/11/2023   Wt Readings from Last 3 Encounters:  07/23/24 248 lb (112.5 kg)  04/14/24 245 lb (111.1 kg)  01/24/24 240 lb (108.9 kg)    CONTINUOUS GLUCOSE MONITORING RECORD INTERPRETATION                 Dates of Recording: Aug 9- Jul 23 2024    Sensor description: freestyle libre    Results statistics:   CGM use % of time 91  Average  129  Time in range 86  % Time Above 180 12  % Time above 250 1  % Time Below target 1     Review of Systems See HPI   Past Medical History:  Diagnosis Date   Cancer (HCC)    Chronic rhinitis    followed by dr fleeta smock   GERD (gastroesophageal reflux disease)    Herniated lumbar intervertebral disc     not a problem presently   History of epistaxis    History of hiatal hernia    History of kidney stones    History of renal cell carcinoma 08/2016   urologist--- dr cam;   s/p right partial nephrecotmy for papillary RCC, no chemo / radiation  (previously seen by oncology--- dr lonn, lov note in epic 04-04-2017)   Hypertension    Incisional hernia    Nocturia    OA (osteoarthritis)    knees   Seasonal allergies    Type 2 diabetes mellitus (HCC)    followed by pcp---  (11-27-2021  per pt check blood sugar twice daily, fasting sugar---112--140)   Varicose vein of leg     Social History   Socioeconomic History   Marital status: Married    Spouse name: Alfonso   Number of children: 2   Years of education: Not on file   Highest education level: Bachelor's degree (e.g., BA, AB, BS)  Occupational History   Occupation: Code and compliance    Employer: [employer]  Tobacco Use   Smoking status: Some Days    Types: Cigars   Smokeless tobacco: Never   Tobacco comments:    04/14/2024 Per pt average one cigar every 2-3 months year  Vaping Use  Vaping status: Never Used  Substance and Sexual Activity   Alcohol use: Yes    Comment: occasional beer and liquor   Drug use: No   Sexual activity: Yes  Other Topics Concern   Not on file  Social History Narrative   Retired from post office    Works part time for the [employer]    Social Drivers of [employer]: Low Risk  (03/26/2022)   Overall Financial Resource Strain (CARDIA)    Difficulty of Paying Living Expenses: Not hard at all  Food Insecurity: No Food Insecurity (09/04/2023)   Hunger Vital Sign    Worried About Running Out of Food in the Last Year: Never true    Ran Out of Food in the Last Year: Never true  Transportation Needs: No Transportation Needs (09/04/2023)   PRAPARE - Administrator, Civil Service (Medical): No    Lack of Transportation (Non-Medical): No   Physical Activity: Insufficiently Active (03/26/2022)   Exercise Vital Sign    Days of Exercise per Week: 4 days    Minutes of Exercise per Session: 20 min  Stress: Patient Declined (03/26/2022)   [employer] of Occupational Health - Occupational Stress Questionnaire    Feeling of Stress : Patient declined  Social Connections: Unknown (03/26/2022)   Social Connection and Isolation Panel    Frequency of Communication with Friends and Family: Once a week    Frequency of Social Gatherings with Friends and Family: Patient declined    Attends Religious Services: Patient declined    Active Member of Clubs or Organizations: Yes    Attends Banker Meetings: Patient declined    Marital Status: Married  Catering Manager Violence: Not At Risk (09/04/2023)   Humiliation, Afraid, Rape, and Kick questionnaire    Fear of Current or Ex-Partner: No    Emotionally Abused: No    Physically Abused: No    Sexually Abused: No    Past Surgical History:  Procedure Laterality Date   CYSTECTOMY     per pt as child had a cyst removed that had was deep in chest wall   EXCISION MASS NECK  10/2011   posterior neck   INCISIONAL HERNIA REPAIR N/A 11/29/2021   Procedure: OPEN INCISIONAL HERNIA REPAIR WITH MESH;  Surgeon: Vernetta Berg, MD;  Location: [facility];  Service: General;  Laterality: N/A;   KNEE ARTHROSCOPY W/ MENISCAL REPAIR Bilateral 03/26/2011   @[facility]  by dr duwayne,  left knee//    right knee in 2017   MASS EXCISION N/A 09/11/2017   Procedure: MINOR EXCISION OF BACK MASS, EXCISION OF GLUTEAL MASS;  Surgeon: Rubin Calamity, MD;  Location: [facility];  Service: General;  Laterality: N/A;   ROBOTIC ASSITED PARTIAL NEPHRECTOMY Right 09/07/2016   Procedure: XI ROBOTIC ASSITED LAPAROSCOPIC RIGHT PARTIAL NEPHRECTOMY;  Surgeon: Morene LELON Salines, MD;  Location: [facility];  Service: Urology;  Laterality: Right;   TONSILLECTOMY     child   TOTAL KNEE ARTHROPLASTY Left  09/04/2023   Procedure: TOTAL KNEE ARTHROPLASTY;  Surgeon: Duwayne Purchase, MD;  Location: [facility];  Service: Orthopedics;  Laterality: Left;   VARICOSE VEIN SURGERY Bilateral 01/2013   per pt stripping    Family History  Problem Relation Age of Onset   Stroke Father        age 46   Colon polyps Sister    Colon polyps Brother    Cancer Brother  Hodgkin   HIV/AIDS Brother        age 49    Colon cancer Neg Hx    Esophageal cancer Neg Hx    Rectal cancer Neg Hx    Stomach cancer Neg Hx     Allergies  Allergen Reactions   Mixed Ragweed Other (See Comments)    Aggravate allergies   Other Itching    Foods - cantaloupes, tomatoes, bananas, and  raw carrots.   Penicillins Itching    Has patient had a PCN reaction causing immediate rash, facial/tongue/throat swelling, SOB or lightheadedness with hypotension: No Has patient had a PCN reaction causing severe rash involving mucus membranes or skin necrosis: No Has patient had a PCN reaction that required hospitalization: No Has patient had a PCN reaction occurring within the last 10 years: No If all of the above answers are NO, then may proceed with Cephalosporin use.     Current Outpatient Medications on File Prior to Visit  Medication Sig Dispense Refill   ARGININE PO Take 1,000-2,000 mg by mouth See admin instructions. Take 1000 mg in the morning and 2000 mg in the afternoon 1000 mg     aspirin  EC 81 MG tablet Take 1 tablet (81 mg total) by mouth 2 (two) times daily after a meal. Day after surgery 60 tablet 1   blood glucose meter kit and supplies KIT Dispense based on patient and insurance preference. Use up to three times daily as directed. 1 each 0   cetirizine (ZYRTEC) 10 MG tablet Take 10 mg by mouth daily as needed for allergies.     Coenzyme Q10 (COQ10) 100 MG CAPS Take 100 mg by mouth daily.     Continuous Glucose Sensor (FREESTYLE LIBRE 3 PLUS SENSOR) MISC APPLY ONE SENSOR TOPICALLY TO SKIN TO CHECK GLUCOSE  CONTINUOUSLY; CHANGE EVERY FIVETEEN DAYS 2 each 3   EPINEPHrine  0.3 mg/0.3 mL IJ SOAJ injection Inject 0.3 mg into the muscle as needed.     hydrochlorothiazide  (MICROZIDE ) 12.5 MG capsule TAKE 1 CAPSULE(12.5 MG) BY MOUTH DAILY 90 capsule 3   metFORMIN  (GLUCOPHAGE -XR) 500 MG 24 hr tablet TAKE TWO TABLETS BY MOUTH TWICE DAILY WITH A MEAL 360 tablet 0   Multiple Vitamin (MULTIVITAMIN WITH MINERALS) TABS Take 1 tablet by mouth daily. Centrum +     Multiple Vitamins-Minerals (PRESERVISION AREDS 2 PO) Take 1 tablet by mouth 2 (two) times daily.     oxyCODONE  (OXY IR/ROXICODONE ) 5 MG immediate release tablet Take 1 tablet (5 mg total) by mouth every 4 (four) hours as needed for severe pain (pain score 7-10). 40 tablet 0   oxymetazoline  (AFRIN) 0.05 % nasal spray Place 1 spray into both nostrils 2 (two) times daily as needed (nose bleeds).     polyethylene glycol (MIRALAX  / GLYCOLAX ) 17 g packet Take 17 g by mouth daily. 14 each 0   PRESCRIPTION MEDICATION PER PT ALLERGY INJECTION  PER MONTH IN SUMMER     simvastatin  (ZOCOR ) 5 MG tablet TAKE ONE TABLET BY MOUTH DAILY AT BEDTIME 90 tablet 0   sodium chloride  (OCEAN) 0.65 % SOLN nasal spray Place 1 spray into both nostrils as needed for congestion.     tamsulosin  (FLOMAX ) 0.4 MG CAPS capsule Take 0.4 mg by mouth daily.     triamcinolone  (NASACORT ) 55 MCG/ACT AERO nasal inhaler Place 2 sprays into the nose daily. 2 sprays each nostril at night (Patient taking differently: Place 2 sprays into the nose daily as needed (Congestion and allergies).) 1 each  12   TURMERIC PO Take 750 mg by mouth 2 (two) times daily.     No current facility-administered medications on file prior to visit.    BP 138/82   Pulse 73   Temp 98.3 F (36.8 C) (Oral)   Ht 5' 11 (1.803 m)   Wt 248 lb (112.5 kg)   SpO2 98%   BMI 34.59 kg/m       Objective:   Physical Exam Vitals and nursing note reviewed.  Constitutional:      Appearance: Normal appearance. He is obese.   Cardiovascular:     Rate and Rhythm: Normal rate and regular rhythm.     Pulses: Normal pulses.     Heart sounds: Normal heart sounds.  Pulmonary:     Effort: Pulmonary effort is normal.     Breath sounds: Normal breath sounds.  Skin:    General: Skin is warm and dry.  Neurological:     General: No focal deficit present.     Mental Status: He is alert and oriented to person, place, and time.  Psychiatric:        Mood and Affect: Mood normal.        Behavior: Behavior normal.        Thought Content: Thought content normal.        Judgment: Judgment normal.        Assessment & Plan:  1. Diabetes mellitus treated with oral medication (HCC) (Primary)  - POC HgB A1c- 6.7  - He would like to stay at 500 mg BID for the next three months. He is determined to get his weight back down and lower his A1c   2. Essential hypertension - Controlled. No change in medication   Darleene Shape, NP

## 2024-08-17 ENCOUNTER — Other Ambulatory Visit: Payer: Self-pay | Admitting: Adult Health

## 2024-08-17 DIAGNOSIS — E782 Mixed hyperlipidemia: Secondary | ICD-10-CM

## 2024-09-25 ENCOUNTER — Other Ambulatory Visit: Payer: Self-pay | Admitting: Adult Health

## 2024-09-25 DIAGNOSIS — I10 Essential (primary) hypertension: Secondary | ICD-10-CM

## 2024-10-23 ENCOUNTER — Ambulatory Visit: Admitting: Adult Health

## 2024-11-13 ENCOUNTER — Ambulatory Visit: Admitting: Adult Health
